# Patient Record
Sex: Female | Born: 1940 | Race: White | Hispanic: No | Marital: Married | Smoking: Former smoker
Health system: Southern US, Community
[De-identification: ages and names within clinical notes are randomized; demographics above are authoritative.]

## PROBLEM LIST (undated history)

## (undated) DIAGNOSIS — J189 Pneumonia, unspecified organism: Secondary | ICD-10-CM

## (undated) DIAGNOSIS — J449 Chronic obstructive pulmonary disease, unspecified: Secondary | ICD-10-CM

## (undated) DIAGNOSIS — K222 Esophageal obstruction: Secondary | ICD-10-CM

## (undated) DIAGNOSIS — K219 Gastro-esophageal reflux disease without esophagitis: Secondary | ICD-10-CM

## (undated) DIAGNOSIS — R0600 Dyspnea, unspecified: Secondary | ICD-10-CM

## (undated) DIAGNOSIS — C50919 Malignant neoplasm of unspecified site of unspecified female breast: Secondary | ICD-10-CM

## (undated) DIAGNOSIS — C159 Malignant neoplasm of esophagus, unspecified: Secondary | ICD-10-CM

## (undated) HISTORY — PX: BREAST LUMPECTOMY: SHX2

## (undated) HISTORY — DX: Malignant neoplasm of unspecified site of unspecified female breast: C50.919

## (undated) HISTORY — DX: Chronic obstructive pulmonary disease, unspecified: J44.9

---

## 1998-04-03 ENCOUNTER — Other Ambulatory Visit: Admission: RE | Admit: 1998-04-03 | Discharge: 1998-04-03 | Payer: Self-pay | Admitting: Obstetrics and Gynecology

## 2000-12-10 ENCOUNTER — Other Ambulatory Visit: Admission: RE | Admit: 2000-12-10 | Discharge: 2000-12-10 | Payer: Self-pay | Admitting: Family Medicine

## 2004-07-12 ENCOUNTER — Other Ambulatory Visit: Admission: RE | Admit: 2004-07-12 | Discharge: 2004-07-12 | Payer: Self-pay | Admitting: Family Medicine

## 2004-12-20 ENCOUNTER — Encounter: Admission: RE | Admit: 2004-12-20 | Discharge: 2004-12-20 | Payer: Self-pay | Admitting: Surgery

## 2005-01-09 ENCOUNTER — Encounter: Admission: RE | Admit: 2005-01-09 | Discharge: 2005-01-09 | Payer: Self-pay | Admitting: Surgery

## 2005-01-10 ENCOUNTER — Ambulatory Visit (HOSPITAL_BASED_OUTPATIENT_CLINIC_OR_DEPARTMENT_OTHER): Admission: RE | Admit: 2005-01-10 | Discharge: 2005-01-10 | Payer: Self-pay | Admitting: Surgery

## 2005-01-10 ENCOUNTER — Ambulatory Visit (HOSPITAL_COMMUNITY): Admission: RE | Admit: 2005-01-10 | Discharge: 2005-01-10 | Payer: Self-pay | Admitting: Surgery

## 2005-01-11 ENCOUNTER — Encounter (INDEPENDENT_AMBULATORY_CARE_PROVIDER_SITE_OTHER): Payer: Self-pay | Admitting: *Deleted

## 2005-01-21 ENCOUNTER — Ambulatory Visit (HOSPITAL_BASED_OUTPATIENT_CLINIC_OR_DEPARTMENT_OTHER): Admission: RE | Admit: 2005-01-21 | Discharge: 2005-01-21 | Payer: Self-pay | Admitting: Surgery

## 2005-01-21 ENCOUNTER — Ambulatory Visit (HOSPITAL_COMMUNITY): Admission: RE | Admit: 2005-01-21 | Discharge: 2005-01-21 | Payer: Self-pay | Admitting: Surgery

## 2005-01-21 ENCOUNTER — Encounter (INDEPENDENT_AMBULATORY_CARE_PROVIDER_SITE_OTHER): Payer: Self-pay | Admitting: *Deleted

## 2005-01-29 ENCOUNTER — Ambulatory Visit: Payer: Self-pay | Admitting: Oncology

## 2005-02-12 ENCOUNTER — Ambulatory Visit: Admission: RE | Admit: 2005-02-12 | Discharge: 2005-04-11 | Payer: Self-pay | Admitting: Radiation Oncology

## 2005-04-04 ENCOUNTER — Ambulatory Visit: Payer: Self-pay | Admitting: Oncology

## 2005-04-29 ENCOUNTER — Ambulatory Visit (HOSPITAL_COMMUNITY): Admission: RE | Admit: 2005-04-29 | Discharge: 2005-04-29 | Payer: Self-pay | Admitting: Oncology

## 2005-05-15 ENCOUNTER — Ambulatory Visit: Admission: RE | Admit: 2005-05-15 | Discharge: 2005-05-15 | Payer: Self-pay | Admitting: Radiation Oncology

## 2005-08-01 ENCOUNTER — Other Ambulatory Visit: Admission: RE | Admit: 2005-08-01 | Discharge: 2005-08-01 | Payer: Self-pay | Admitting: Obstetrics and Gynecology

## 2005-08-23 ENCOUNTER — Ambulatory Visit: Payer: Self-pay | Admitting: Oncology

## 2006-08-04 ENCOUNTER — Other Ambulatory Visit: Admission: RE | Admit: 2006-08-04 | Discharge: 2006-08-04 | Payer: Self-pay | Admitting: Family Medicine

## 2006-08-27 ENCOUNTER — Ambulatory Visit: Payer: Self-pay | Admitting: Oncology

## 2006-08-29 LAB — COMPREHENSIVE METABOLIC PANEL
Alkaline Phosphatase: 42 U/L (ref 39–117)
CO2: 25 mEq/L (ref 19–32)
Calcium: 9.2 mg/dL (ref 8.4–10.5)
Sodium: 140 mEq/L (ref 135–145)
Total Bilirubin: 0.4 mg/dL (ref 0.3–1.2)

## 2006-08-29 LAB — CBC WITH DIFFERENTIAL/PLATELET
Basophils Absolute: 0 10*3/uL (ref 0.0–0.1)
EOS%: 1.8 % (ref 0.0–7.0)
HCT: 40.2 % (ref 34.8–46.6)
LYMPH%: 33.5 % (ref 14.0–48.0)
MCH: 33.2 pg (ref 26.0–34.0)
MCHC: 34.7 g/dL (ref 32.0–36.0)
MONO%: 6.3 % (ref 0.0–13.0)
NEUT#: 3.2 10*3/uL (ref 1.5–6.5)
Platelets: 187 10*3/uL (ref 145–400)
RBC: 4.19 10*6/uL (ref 3.70–5.32)
RDW: 12.5 % (ref 11.3–14.5)

## 2007-02-16 ENCOUNTER — Ambulatory Visit: Payer: Self-pay | Admitting: Oncology

## 2007-02-26 LAB — COMPREHENSIVE METABOLIC PANEL
ALT: 10 U/L (ref 0–35)
AST: 16 U/L (ref 0–37)
CO2: 27 mEq/L (ref 19–32)
Calcium: 9 mg/dL (ref 8.4–10.5)
Chloride: 106 mEq/L (ref 96–112)
Creatinine, Ser: 0.59 mg/dL (ref 0.40–1.20)
Glucose, Bld: 86 mg/dL (ref 70–99)
Potassium: 4.1 mEq/L (ref 3.5–5.3)
Sodium: 140 mEq/L (ref 135–145)

## 2007-02-26 LAB — CBC WITH DIFFERENTIAL/PLATELET
BASO%: 0.5 % (ref 0.0–2.0)
HCT: 39.7 % (ref 34.8–46.6)
LYMPH%: 19.8 % (ref 14.0–48.0)
MCH: 32.8 pg (ref 26.0–34.0)
MCV: 94.1 fL (ref 81.0–101.0)
MONO#: 0.3 10*3/uL (ref 0.1–0.9)
NEUT#: 4.3 10*3/uL (ref 1.5–6.5)
Platelets: 181 10*3/uL (ref 145–400)
RBC: 4.23 10*6/uL (ref 3.70–5.32)
RDW: 12.9 % (ref 11.3–14.5)
lymph#: 1.2 10*3/uL (ref 0.9–3.3)

## 2007-03-05 ENCOUNTER — Encounter: Admission: RE | Admit: 2007-03-05 | Discharge: 2007-03-05 | Payer: Self-pay | Admitting: Family Medicine

## 2007-08-14 ENCOUNTER — Ambulatory Visit: Payer: Self-pay | Admitting: Oncology

## 2007-08-18 LAB — COMPREHENSIVE METABOLIC PANEL
Albumin: 4.4 g/dL (ref 3.5–5.2)
Alkaline Phosphatase: 41 U/L (ref 39–117)
BUN: 6 mg/dL (ref 6–23)
Calcium: 9.4 mg/dL (ref 8.4–10.5)
Chloride: 107 mEq/L (ref 96–112)
Glucose, Bld: 87 mg/dL (ref 70–99)
Potassium: 4.4 mEq/L (ref 3.5–5.3)
Sodium: 139 mEq/L (ref 135–145)

## 2007-08-18 LAB — CBC WITH DIFFERENTIAL/PLATELET
BASO%: 0.9 % (ref 0.0–2.0)
EOS%: 1.4 % (ref 0.0–7.0)
Eosinophils Absolute: 0.1 10*3/uL (ref 0.0–0.5)
LYMPH%: 29.9 % (ref 14.0–48.0)
MONO#: 0.3 10*3/uL (ref 0.1–0.9)
NEUT#: 3.4 10*3/uL (ref 1.5–6.5)
NEUT%: 63.1 % (ref 39.6–76.8)
RBC: 4.32 10*6/uL (ref 3.70–5.32)
RDW: 12.9 % (ref 11.3–14.5)

## 2007-08-18 LAB — LACTATE DEHYDROGENASE: LDH: 140 U/L (ref 94–250)

## 2008-02-16 ENCOUNTER — Ambulatory Visit: Payer: Self-pay | Admitting: Oncology

## 2008-02-18 LAB — CBC WITH DIFFERENTIAL/PLATELET
BASO%: 1.2 % (ref 0.0–2.0)
Basophils Absolute: 0.1 10*3/uL (ref 0.0–0.1)
Eosinophils Absolute: 0.1 10*3/uL (ref 0.0–0.5)
LYMPH%: 32.7 % (ref 14.0–48.0)
MCH: 33.4 pg (ref 26.0–34.0)
MCV: 93.2 fL (ref 81.0–101.0)
MONO#: 0.2 10*3/uL (ref 0.1–0.9)
MONO%: 4.1 % (ref 0.0–13.0)
NEUT#: 3.4 10*3/uL (ref 1.5–6.5)
NEUT%: 60.5 % (ref 39.6–76.8)
Platelets: 169 10*3/uL (ref 145–400)
WBC: 5.6 10*3/uL (ref 3.9–10.0)

## 2008-02-18 LAB — COMPREHENSIVE METABOLIC PANEL
AST: 14 U/L (ref 0–37)
Albumin: 4.2 g/dL (ref 3.5–5.2)
Alkaline Phosphatase: 48 U/L (ref 39–117)
CO2: 24 mEq/L (ref 19–32)
Chloride: 107 mEq/L (ref 96–112)
Potassium: 3.8 mEq/L (ref 3.5–5.3)
Total Bilirubin: 0.4 mg/dL (ref 0.3–1.2)

## 2008-10-13 ENCOUNTER — Ambulatory Visit: Payer: Self-pay | Admitting: Oncology

## 2008-10-17 LAB — CBC WITH DIFFERENTIAL/PLATELET
BASO%: 0.5 % (ref 0.0–2.0)
Basophils Absolute: 0 10*3/uL (ref 0.0–0.1)
HCT: 40.6 % (ref 34.8–46.6)
LYMPH%: 11.1 % — ABNORMAL LOW (ref 14.0–48.0)
MCH: 33.1 pg (ref 26.0–34.0)
MCHC: 34.3 g/dL (ref 32.0–36.0)
NEUT#: 8.5 10*3/uL — ABNORMAL HIGH (ref 1.5–6.5)
NEUT%: 84.4 % — ABNORMAL HIGH (ref 39.6–76.8)
RBC: 4.22 10*6/uL (ref 3.70–5.32)
WBC: 10.1 10*3/uL — ABNORMAL HIGH (ref 3.9–10.0)
lymph#: 1.1 10*3/uL (ref 0.9–3.3)

## 2008-10-18 LAB — LACTATE DEHYDROGENASE: LDH: 156 U/L (ref 94–250)

## 2008-10-18 LAB — COMPREHENSIVE METABOLIC PANEL
ALT: 11 U/L (ref 0–35)
AST: 13 U/L (ref 0–37)
BUN: 9 mg/dL (ref 6–23)
CO2: 25 mEq/L (ref 19–32)
Creatinine, Ser: 0.66 mg/dL (ref 0.40–1.20)

## 2008-10-18 LAB — CANCER ANTIGEN 27.29: CA 27.29: 15 U/mL (ref 0–39)

## 2009-04-28 ENCOUNTER — Ambulatory Visit: Payer: Self-pay | Admitting: Oncology

## 2009-10-31 ENCOUNTER — Ambulatory Visit: Payer: Self-pay | Admitting: Oncology

## 2009-11-02 LAB — CBC WITH DIFFERENTIAL/PLATELET
Basophils Absolute: 0 10*3/uL (ref 0.0–0.1)
HCT: 38.2 % (ref 34.8–46.6)
HGB: 13.1 g/dL (ref 11.6–15.9)
MCH: 33.4 pg (ref 25.1–34.0)
MONO#: 0.3 10*3/uL (ref 0.1–0.9)
MONO%: 5.4 % (ref 0.0–14.0)
RBC: 3.94 10*6/uL (ref 3.70–5.45)
RDW: 12.7 % (ref 11.2–14.5)
lymph#: 1.4 10*3/uL (ref 0.9–3.3)

## 2009-11-03 LAB — COMPREHENSIVE METABOLIC PANEL
AST: 15 U/L (ref 0–37)
BUN: 12 mg/dL (ref 6–23)
CO2: 21 mEq/L (ref 19–32)
Creatinine, Ser: 0.73 mg/dL (ref 0.40–1.20)
Total Bilirubin: 0.3 mg/dL (ref 0.3–1.2)
Total Protein: 5.9 g/dL — ABNORMAL LOW (ref 6.0–8.3)

## 2009-11-03 LAB — LACTATE DEHYDROGENASE: LDH: 128 U/L (ref 94–250)

## 2009-11-03 LAB — VITAMIN D 25 HYDROXY (VIT D DEFICIENCY, FRACTURES): Vit D, 25-Hydroxy: 48 ng/mL (ref 30–89)

## 2009-11-11 ENCOUNTER — Ambulatory Visit: Payer: Self-pay | Admitting: Family Medicine

## 2009-11-11 DIAGNOSIS — L509 Urticaria, unspecified: Secondary | ICD-10-CM | POA: Insufficient documentation

## 2009-11-11 LAB — CONVERTED CEMR LAB
Basophils Relative: 1 % (ref 0–1)
Eosinophils Absolute: 0.2 10*3/uL (ref 0.0–0.7)
Eosinophils Relative: 4 % (ref 0–5)
HCT: 44.7 % (ref 36.0–46.0)
Hemoglobin: 15.1 g/dL — ABNORMAL HIGH (ref 12.0–15.0)
Monocytes Absolute: 0.3 10*3/uL (ref 0.1–1.0)
Monocytes Relative: 6 % (ref 3–12)
RBC: 4.73 M/uL (ref 3.87–5.11)

## 2009-11-18 ENCOUNTER — Telehealth: Payer: Self-pay | Admitting: Family Medicine

## 2010-02-20 ENCOUNTER — Encounter: Admission: RE | Admit: 2010-02-20 | Discharge: 2010-02-20 | Payer: Self-pay | Admitting: Oncology

## 2010-11-06 ENCOUNTER — Ambulatory Visit: Payer: Self-pay | Admitting: Oncology

## 2011-01-02 ENCOUNTER — Ambulatory Visit: Payer: Self-pay | Admitting: Oncology

## 2011-01-03 LAB — CBC WITH DIFFERENTIAL/PLATELET
BASO%: 0.8 % (ref 0.0–2.0)
Basophils Absolute: 0.1 10*3/uL (ref 0.0–0.1)
EOS%: 0.8 % (ref 0.0–7.0)
Eosinophils Absolute: 0.1 10*3/uL (ref 0.0–0.5)
HCT: 38.4 % (ref 34.8–46.6)
HGB: 13.2 g/dL (ref 11.6–15.9)
LYMPH%: 19.2 % (ref 14.0–49.7)
MCH: 31.4 pg (ref 25.1–34.0)
MCHC: 34.3 g/dL (ref 31.5–36.0)
MCV: 91.4 fL (ref 79.5–101.0)
MONO#: 0.3 10*3/uL (ref 0.1–0.9)
MONO%: 4.4 % (ref 0.0–14.0)
NEUT#: 4.8 10*3/uL (ref 1.5–6.5)
NEUT%: 74.8 % (ref 38.4–76.8)
Platelets: 249 10*3/uL (ref 145–400)
RBC: 4.2 10*6/uL (ref 3.70–5.45)
RDW: 13.1 % (ref 11.2–14.5)
WBC: 6.4 10*3/uL (ref 3.9–10.3)
lymph#: 1.2 10*3/uL (ref 0.9–3.3)

## 2011-01-04 LAB — COMPREHENSIVE METABOLIC PANEL
ALT: 12 U/L (ref 0–35)
AST: 14 U/L (ref 0–37)
Albumin: 4.2 g/dL (ref 3.5–5.2)
Alkaline Phosphatase: 76 U/L (ref 39–117)
BUN: 7 mg/dL (ref 6–23)
CO2: 28 mEq/L (ref 19–32)
Calcium: 9.1 mg/dL (ref 8.4–10.5)
Chloride: 103 mEq/L (ref 96–112)
Creatinine, Ser: 0.69 mg/dL (ref 0.40–1.20)
Glucose, Bld: 80 mg/dL (ref 70–99)
Potassium: 4.1 mEq/L (ref 3.5–5.3)
Sodium: 141 mEq/L (ref 135–145)
Total Bilirubin: 0.4 mg/dL (ref 0.3–1.2)
Total Protein: 6.4 g/dL (ref 6.0–8.3)

## 2011-01-04 LAB — VITAMIN D 25 HYDROXY (VIT D DEFICIENCY, FRACTURES): Vit D, 25-Hydroxy: 18 ng/mL — ABNORMAL LOW (ref 30–89)

## 2011-01-04 LAB — LACTATE DEHYDROGENASE: LDH: 141 U/L (ref 94–250)

## 2011-05-17 NOTE — Op Note (Signed)
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   OPERATION PERFORMED:  Re-excision of left breast cancer site.   SURGEON:  Currie Paris, M.D.   ANESTHESIA:  MAC.   INDICATIONS FOR PROCEDURE:  The patient is a 70 year old who was recently  found to have a small breast cancer at the 12 o'clock position.  Her initial  excision had a microscopically focally positive cranial margin.  After  discussion with the patient, we decided to try to re-excise this single  margin.   DESCRIPTION OF PROCEDURE:  The patient was seen in the holding areas and had  no further questions.  She had already marked the left breast as the  operative side and I confirmed and marked it as well.  The patient was taken  to the operating room and given intravenous sedation.  The left breast was  prepped and draped as a sterile field.  The old scar was reopened and the  seroma cavity entered.  Superiorly, I had a fairly long flap as I had  already gone fairly high in the breast tissue getting out the original  cancer but using some skin hooks to elevate the skin, I took what little  breast tissue remained superiorly so that I was several centimeters above  the incision and took bot the subcutaneous skin and what thin amount of  breast tissue remained at the superior margin.  The deep margin had already  been the muscle and this was left alone.  There was nothing to suggest any  other tumor around.   I made sure everything was dry and then closed the  skin with some 4-0  Monocryl subcuticular and Dermabond.  We held some pressure to try to be  sure there was no oozing.  Although everything appeared to be dry, we were  going to have a fairly large seroma cavity following this re-excision.   The patient tolerated the procedure well.  There were no operative  complications.  All counts were correct.      CJS/MEDQ  D:  01/21/2005  T:  01/21/2005  Job:  [id number]

## 2011-11-25 ENCOUNTER — Telehealth: Payer: Self-pay | Admitting: *Deleted

## 2011-11-25 NOTE — Telephone Encounter (Signed)
left voice message to inform the patient of the new date and time asked patient to please call me back and let me know she did get the message

## 2011-12-20 ENCOUNTER — Other Ambulatory Visit: Payer: Self-pay | Admitting: Lab

## 2011-12-23 ENCOUNTER — Ambulatory Visit: Payer: Self-pay | Admitting: Oncology

## 2011-12-27 ENCOUNTER — Ambulatory Visit: Payer: Self-pay | Admitting: Oncology

## 2011-12-30 ENCOUNTER — Other Ambulatory Visit: Payer: Self-pay | Admitting: Oncology

## 2011-12-30 ENCOUNTER — Other Ambulatory Visit (HOSPITAL_BASED_OUTPATIENT_CLINIC_OR_DEPARTMENT_OTHER): Payer: BC Managed Care – PPO | Admitting: Lab

## 2011-12-30 DIAGNOSIS — D0590 Unspecified type of carcinoma in situ of unspecified breast: Secondary | ICD-10-CM

## 2011-12-30 DIAGNOSIS — Z803 Family history of malignant neoplasm of breast: Secondary | ICD-10-CM

## 2011-12-30 LAB — CBC WITH DIFFERENTIAL/PLATELET
EOS%: 2.3 % (ref 0.0–7.0)
HGB: 14.6 g/dL (ref 11.6–15.9)
LYMPH%: 25.9 % (ref 14.0–49.7)
MCV: 92.4 fL (ref 79.5–101.0)
NEUT#: 3.3 10*3/uL (ref 1.5–6.5)
NEUT%: 66.1 % (ref 38.4–76.8)

## 2011-12-31 ENCOUNTER — Other Ambulatory Visit: Payer: Self-pay | Admitting: Oncology

## 2011-12-31 LAB — COMPREHENSIVE METABOLIC PANEL
Albumin: 4.2 g/dL (ref 3.5–5.2)
Calcium: 9.2 mg/dL (ref 8.4–10.5)
Chloride: 104 mEq/L (ref 96–112)
Creatinine, Ser: 0.64 mg/dL (ref 0.50–1.10)
Glucose, Bld: 79 mg/dL (ref 70–99)
Potassium: 4.2 mEq/L (ref 3.5–5.3)
Sodium: 139 mEq/L (ref 135–145)

## 2011-12-31 LAB — VITAMIN D 25 HYDROXY (VIT D DEFICIENCY, FRACTURES): Vit D, 25-Hydroxy: 43 ng/mL (ref 30–89)

## 2012-01-09 ENCOUNTER — Ambulatory Visit (HOSPITAL_BASED_OUTPATIENT_CLINIC_OR_DEPARTMENT_OTHER): Payer: BC Managed Care – PPO | Admitting: Oncology

## 2012-01-09 VITALS — BP 147/79 | HR 67 | Temp 98.0°F | Ht 61.0 in | Wt 84.3 lb

## 2012-01-09 DIAGNOSIS — D0510 Intraductal carcinoma in situ of unspecified breast: Secondary | ICD-10-CM

## 2012-01-09 DIAGNOSIS — D0590 Unspecified type of carcinoma in situ of unspecified breast: Secondary | ICD-10-CM

## 2012-01-09 NOTE — Progress Notes (Signed)
Hematology and Oncology Follow Up Visit  Karen Osborn [id number] 09-26-1941 71 y.o. 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 Tamoxifen x  85yrs. On annual f/u.  Interim History:  There have been no intercurrent illness, hospitalizations or medication changes. Due for mamm 2/13.  Medications: I have reviewed the patient's current medications.  Allergies: No Known Allergies  Past Medical History, Surgical history, Social history, and Family History were reviewed and updated.  Review of Systems: Constitutional:  Negative for fever, chills, night sweats, anorexia, weight loss, pain. Cardiovascular: no chest pain or dyspnea on exertion Respiratory: no cough, shortness of breath, or wheezing Neurological: negative Dermatological: negative ENT: negative Skin Gastrointestinal: no abdominal pain, change in bowel habits, or black or bloody stools Genito-Urinary: no dysuria, trouble voiding, or hematuria Hematological and Lymphatic: negative Breast: negative Musculoskeletal: negative Remaining ROS negative.  Physical Exam: Blood pressure 147/79, pulse 67, temperature 98 F (36.7 C), temperature source Oral, height 5\' 1"  (1.549 m), weight 84 lb 4.8 oz (38.238 kg). ECOG: 0, thin woman General appearance: alert, cooperative and appears stated age Head: Normocephalic, without obvious abnormality, atraumatic Neck: no adenopathy, no carotid bruit, no JVD, supple, symmetrical, trachea midline and thyroid not enlarged, symmetric, no tenderness/mass/nodules Lymph nodes: Cervical, supraclavicular, and axillary nodes normal., bilateral cervical ribs Cardiac : regular rate and rhythm, no murmurs or gallops Pulmonary:clear to auscultation bilaterally and normal percussion bilaterally Breasts: inspection negative, no nipple discharge or bleeding, no masses or nodularity palpable,  Abdomen:soft, non-tender; bowel sounds normal; no  masses,  no organomegaly Extremities negative Neuro: alert, oriented, normal speech, no focal findings or movement disorder noted  Lab Results: Lab Results  Component Value Date   WBC 5.0 12/30/2011   HGB 14.6 12/30/2011   HCT 42.4 12/30/2011   MCV 92.4 12/30/2011   PLT 166 12/30/2011     Chemistry      Component Value Date/Time   NA 139 12/30/2011 0941   NA 139 12/30/2011 0941   K 4.2 12/30/2011 0941   K 4.2 12/30/2011 0941   CL 104 12/30/2011 0941   CL 104 12/30/2011 0941   CO2 28 12/30/2011 0941   CO2 28 12/30/2011 0941   BUN 7 12/30/2011 0941   BUN 7 12/30/2011 0941   CREATININE 0.64 12/30/2011 0941   CREATININE 0.64 12/30/2011 0941      Component Value Date/Time   CALCIUM 9.2 12/30/2011 0941   CALCIUM 9.2 12/30/2011 0941   ALKPHOS 68 12/30/2011 0941   ALKPHOS 68 12/30/2011 0941   AST 16 12/30/2011 0941   AST 16 12/30/2011 0941   ALT 9 12/30/2011 0941   ALT 9 12/30/2011 0941   BILITOT 0.5 12/30/2011 0941   BILITOT 0.5 12/30/2011 0941      .pathology. Radiological Studies: chest X-ray n/a Mammogram 2/13 Bone density n/a  Impression and Plan: Karen Osborn is doing well, she is chronically thin. I will see her in 1 yr .  More than 50% of the visit was spent in patient-related counselling   Pierce Crane, MD 1/10/20132:20 PM

## 2012-12-24 ENCOUNTER — Telehealth: Payer: Self-pay | Admitting: *Deleted

## 2012-12-24 NOTE — Telephone Encounter (Signed)
left voice message to inform the patient that the appointment has been cancelled please call back to reschedule

## 2013-01-08 ENCOUNTER — Ambulatory Visit: Payer: BC Managed Care – PPO | Admitting: Oncology

## 2013-01-08 ENCOUNTER — Other Ambulatory Visit: Payer: BC Managed Care – PPO | Admitting: Lab

## 2013-02-06 ENCOUNTER — Telehealth: Payer: Self-pay | Admitting: *Deleted

## 2013-02-06 NOTE — Telephone Encounter (Signed)
Per patient reassignment I have contact the patient. I have explained that Dr. Donnie Coffin is no longer with the practice, but her has reviewed her chart and wants [facility] to follow her care. Patient agrees, and appt made. Letter mailed. JMW

## 2013-02-12 ENCOUNTER — Ambulatory Visit: Payer: BC Managed Care – PPO | Admitting: Family

## 2013-02-18 ENCOUNTER — Encounter: Payer: Self-pay | Admitting: Family

## 2013-02-18 ENCOUNTER — Ambulatory Visit (HOSPITAL_BASED_OUTPATIENT_CLINIC_OR_DEPARTMENT_OTHER): Payer: BC Managed Care – PPO | Admitting: Family

## 2013-02-18 VITALS — BP 118/71 | HR 76 | Temp 98.4°F | Resp 18 | Ht 61.0 in | Wt 76.5 lb

## 2013-02-18 DIAGNOSIS — C50919 Malignant neoplasm of unspecified site of unspecified female breast: Secondary | ICD-10-CM | POA: Insufficient documentation

## 2013-02-18 NOTE — Progress Notes (Signed)
ID: Karen Osborn   DOB: 07-03-1941  MR#: [id number]  CSN#:[id number]  PCP: Gillermina Hu, NP-C SURGEON:  Cyndia Bent, MD RAD ONC:  Margaretmary Dys, MD    HISTORY OF PRESENT ILLNESS: Taken from [name]'s new patient evaluation dated 02/08/2005: "Karen Osborn is a pleasant 72 year old woman referred by [name] for evaluation and treatment of DCIS. This woman has been in good health all of her life.  She has no chronic medical problems and is not taking any medications on a daily basis.  She undergoes routine scanning mammography on a yearly basis.  She had a mammogram on 12/10/04.  On the left at 12 o'clock, there was a 7 mm cluster of punctate calcifications.  Ultrasound showed at the time a normal echo pattern.  There was an irregular hypoechoic mass measuring 4 mm with calcifications at the 12 o'clock position on the left breast.  A biopsy was performed the same day.  This showed DCIS.  She was referred to [name]. He took her to the operating room on 01/11/05.  A lumpectomy was performed.  A complex high-grade DCIS with necrosis measuring 1.1 cm was removed.  DCIS was present at the cauterized cranial margin. This was positive for both ER and PR.  There was DCIS within 1 mm of the deep margin as well as 3 mm of the skin margin. Karen Osborn underwent a reexcision on 01/21/05.  The closest approach of DCIS to the new margin was 1 mm.   Extensive biopsy changes were seen. An additional focus of DCIS measuring 3 mm was removed.  Karen Osborn had an unremarkable postoperative course."  [name] is subsequent history is as detailed below.     INTERVAL HISTORY: Dr. Darnelle Catalan and I saw Karen Osborn today for follow up of ductal carcinoma in situ high-grade. Since her last office visit with Dr. Donnie Coffin on 01/09/2012,  she states that she has been doing well. She is establishing herself with Dr. Darrall Dears service today.    REVIEW OF SYSTEMS: A 10 point review of systems was  completed and is negative, except for hot flashes night sweats and vaginal dryness. The patient denies any other symptomatology.  PAST MEDICAL HISTORY: Past Medical History  Diagnosis Date  . Breast cancer   . COPD (chronic obstructive pulmonary disease)     PAST SURGICAL HISTORY: Past Surgical History  Procedure Laterality Date  . Breast lumpectomy Left 1/132006    FAMILY HISTORY Family History  Problem Relation Age of Onset  . Heart attack Mother   . Heart attack Father   . Arrhythmia Sister   . Cancer Sister     Breast Cancer  . COPD Brother   . Cancer Sister     Skin Cancer, Breast Cancer    GYNECOLOGIC HISTORY:  Gravida 1, para 1, menarche age 51,. Age 43. Postmenopausal at age 51, she had used estrogen replacement and medroxyprogesterone.  SOCIAL HISTORY:  The patient is a current everyday smoker and does not want smoking cessation material at this time. She has been married for over 50 years and has one adult child. Both her and her husband are retired.   ADVANCED DIRECTIVES:  Not on file  HEALTH MAINTENANCE: History  Substance Use Topics  . Smoking status: Current Every Day Smoker  . Smokeless tobacco: Never Used  . Alcohol Use: No     Colonoscopy: Past due by 2 years  PAP: Not on file  Bone density: The patient states her last  bone density scan was 2 years ago.  Lipid panel: Not on file  Mammogram: Last mammogram was on 03/31/2012.  No Known Allergies  Current Outpatient Prescriptions  Medication Sig Dispense Refill  . Multiple Vitamins-Minerals (MULTIVITAMIN WITH MINERALS) tablet Take 1 tablet by mouth daily.       No current facility-administered medications for this visit.    OBJECTIVE: Filed Vitals:   02/18/13 1406  BP: 118/71  Pulse: 76  Temp: 98.4 F (36.9 C)  Resp: 18     Body mass index is 14.46 kg/(m^2).      ECOG FS:  Grade 0 - Fully active  General appearance: Alert, cooperative, thin frame, no apparent distress Head:  Normocephalic, without obvious abnormality, atraumatic Eyes: Arcus senilis, PERRLA, EOMI Nose: Nares, septum and mucosa are normal, no drainage or sinus tenderness Neck: No adenopathy, supple, symmetrical, trachea midline, thyroid not enlarged, no tenderness Resp: Clear to auscultation bilaterally, diminished throughout Cardio: Regular rate and rhythm, S1, S2 normal, no murmur, click, rub or gallop Breasts: Soft bilaterally, well-healed left breast surgical scar, firm left inframammary area, no lymphadenopathy, no nipple inversion, no axilla fullness, benign breast exam GI: Soft, not distended, non-tender, hypoactive bowel sounds, no organomegaly Extremities: Extremities normal, atraumatic, no cyanosis or edema Lymph nodes: Cervical, supraclavicular, and axillary nodes normal Neurologic: Grossly normal   LAB RESULTS: Lab Results  Component Value Date   WBC 5.0 12/30/2011   NEUTROABS 3.3 12/30/2011   HGB 14.6 12/30/2011   HCT 42.4 12/30/2011   MCV 92.4 12/30/2011   PLT 166 12/30/2011      Chemistry      Component Value Date/Time   NA 139 12/30/2011 0941   K 4.2 12/30/2011 0941   CL 104 12/30/2011 0941   CO2 28 12/30/2011 0941   BUN 7 12/30/2011 0941   CREATININE 0.64 12/30/2011 0941      Component Value Date/Time   CALCIUM 9.2 12/30/2011 0941   ALKPHOS 68 12/30/2011 0941   AST 16 12/30/2011 0941   ALT 9 12/30/2011 0941   BILITOT 0.5 12/30/2011 0941      Lab Results  Component Value Date   LABCA2 15 10/17/2008    Urinalysis No results found for this basename: colorurine,  appearanceur,  labspec,  phurine,  glucoseu,  hgbur,  bilirubinur,  ketonesur,  proteinur,  urobilinogen,  nitrite,  leukocytesur    STUDIES: No results found.  ASSESSMENT: 72 y.o. [place], [place] woman: 1. Status post left breast lumpectomy on 01/11/2005, 1.1 cm DCIS, ER/PR positive.  2. Reexcised on 01/21/2005 for a 3 mm skin margin removal.  3. Status post radiation therapy  completed in 2006.  4. Status post 5 years of antiestrogen therapy with tamoxifen completed in 2011.  PLAN: Karen Osborn is doing well from a breast cancer point of view. She is experiencing usual menopausal problems. Dr. Darnelle Catalan explained to her in detail that her chance of recurrence is extraordinarily low. Mrs. Colvard was officially graduated from [facility] breast cancer center program today with the understanding that she may contact us at any time with any questions or concerns.  It was explained that the patient should continue annual mammograms and an annual clinical breast exam. The patient agreed to do so. We will see the patient from here going forward on an as-needed basis.  The patient will be due for her annual mammogram in 03/2013.  All questions were answered.  The patient was encouraged to contact us with any problems, questions or concerns.  Larina Bras, NP-C 02/18/2013, 10:08 PM

## 2013-02-18 NOTE — Patient Instructions (Signed)
Please contact us at [phone] if you have any questions or concerns. 

## 2015-11-21 ENCOUNTER — Ambulatory Visit (INDEPENDENT_AMBULATORY_CARE_PROVIDER_SITE_OTHER): Payer: Medicare Other | Admitting: Internal Medicine

## 2015-11-21 ENCOUNTER — Other Ambulatory Visit: Payer: Self-pay | Admitting: Family Medicine

## 2015-11-21 DIAGNOSIS — R0600 Dyspnea, unspecified: Secondary | ICD-10-CM | POA: Diagnosis not present

## 2015-11-21 LAB — PULMONARY FUNCTION TEST
DL/VA % pred: 51 %
DL/VA: 2.27 ml/min/mmHg/L
DLCO unc % pred: 40 %
DLCO unc: 8.1 ml/min/mmHg
FEF 25-75 PRE: 0.27 L/s
FEF 25-75 Post: 0.43 L/sec
FEF2575-%CHANGE-POST: 60 %
FEF2575-%PRED-POST: 26 %
FEF2575-%PRED-PRE: 16 %
FEV1-%Change-Post: 18 %
FEV1-%PRED-POST: 42 %
FEV1-%PRED-PRE: 36 %
FEV1-POST: 0.83 L
FEV1-PRE: 0.7 L
FEV1FVC-%CHANGE-POST: 2 %
FEV1FVC-%Pred-Pre: 60 %
FEV6-%CHANGE-POST: 16 %
FEV6-%Pred-Post: 69 %
FEV6-%Pred-Pre: 59 %
FEV6-PRE: 1.45 L
FEV6-Post: 1.69 L
FEV6FVC-%Change-Post: 2 %
FEV6FVC-%Pred-Post: 101 %
FEV6FVC-%Pred-Pre: 99 %
FVC-%CHANGE-POST: 15 %
FVC-%PRED-PRE: 59 %
FVC-%Pred-Post: 69 %
FVC-PRE: 1.53 L
FVC-Post: 1.77 L
POST FEV1/FVC RATIO: 47 %
PRE FEV1/FVC RATIO: 46 %
Post FEV6/FVC ratio: 97 %
Pre FEV6/FVC Ratio: 95 %

## 2015-11-21 NOTE — Progress Notes (Signed)
PFT done today. 

## 2016-04-24 ENCOUNTER — Ambulatory Visit (INDEPENDENT_AMBULATORY_CARE_PROVIDER_SITE_OTHER): Payer: Medicare Other | Admitting: Pulmonary Disease

## 2016-04-24 ENCOUNTER — Ambulatory Visit (INDEPENDENT_AMBULATORY_CARE_PROVIDER_SITE_OTHER)
Admission: RE | Admit: 2016-04-24 | Discharge: 2016-04-24 | Disposition: A | Discharge status: Home / Self Care | Payer: Medicare Other | Source: Ambulatory Visit | Attending: Pulmonary Disease | Admitting: Pulmonary Disease

## 2016-04-24 ENCOUNTER — Encounter: Payer: Self-pay | Admitting: Pulmonary Disease

## 2016-04-24 VITALS — BP 100/80 | HR 89 | Ht 61.0 in | Wt 73.0 lb

## 2016-04-24 DIAGNOSIS — J449 Chronic obstructive pulmonary disease, unspecified: Secondary | ICD-10-CM | POA: Diagnosis not present

## 2016-04-24 DIAGNOSIS — R0602 Shortness of breath: Secondary | ICD-10-CM

## 2016-04-24 DIAGNOSIS — Z720 Tobacco use: Secondary | ICD-10-CM | POA: Diagnosis not present

## 2016-04-24 DIAGNOSIS — F17210 Nicotine dependence, cigarettes, uncomplicated: Secondary | ICD-10-CM | POA: Insufficient documentation

## 2016-04-24 NOTE — Assessment & Plan Note (Signed)
Have to quit smoking! We discussed nicotine patch 14 mg /d She would like to quit cold [place] I emphasized to set a quit date  Lung cancer screening counseling on future visit

## 2016-04-24 NOTE — Assessment & Plan Note (Signed)
severe COPD-lung function is at 30%  Trial of Spiriva Respimat 2 puffs daily morning Use pro-air  2 puffs every 6 hours as needed for shortness of breath  Chest x-ray today Referral to pulmonary rehabilitation  Have to quit smoking! We discussed nicotine patch 14 mg /d

## 2016-04-24 NOTE — Patient Instructions (Addendum)
You have severe COPD-lung function is at 30%  Trial of Spiriva Respimat 2 puffs daily morning Use pro-air  2 puffs every 6 hours as needed for shortness of breath  Chest x-ray today Referral to pulmonary rehabilitation  Have to quit smoking! We discussed nicotine patch 14 mg /d

## 2016-04-24 NOTE — Progress Notes (Signed)
Subjective:    Patient ID: Karen Osborn, female    DOB: 1941-04-16, 75 y.o.   MRN: [id number]  HPI   Chief Complaint  Patient presents with  . Pulmonary Consult    Referred by Dr. Alferd Apa; patient has COPD, was given inhalers that keep making her sick.  lot of SOB. CAT SCORE: 46      75 year old smoker presents for evaluation of COPD. She reports dyspnea on exertion especially on climbing stairs, she is able to walk on level ground and carry groceries from the store. 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 She reports increased dyspnea during spring and fall. She reports an occasional cough productive of minimal milky sputum, denies frequent chest colds or wheezing. There is no history of chest pain, orthopnea or paroxysmal nocturnal dyspnea  She was trialed on multiple medications- Breo caused dryness of mouth, Symbicort and stiolto tore up her stomach.  She smoked about a pack per day starting around age 75, more than 50 pack years, now a pack lasts her a day and a half. She is a breast cancer survivor from 2006.   Spirometry was a good effort, showed severe airway obstruction with FEV1 32% with ratio 43 and FVC of 57%  Past Medical History  Diagnosis Date  . Breast cancer ([place])   . COPD (chronic obstructive pulmonary disease) [facility])      Past Surgical History  Procedure Laterality Date  . Breast lumpectomy Left 1/132006     Allergies  Allergen Reactions  . Codeine Nausea And Vomiting    Social History   Social History  . Marital Status: Married    Spouse Name: N/A  . Number of Children: N/A  . Years of Education: N/A   Occupational History  . Not on file.   Social History Main Topics  . Smoking status: Current Every Day Smoker -- 1.00 packs/day for 54 years    Types: Cigarettes  . Smokeless tobacco: Never Used  . Alcohol Use: No  . Drug Use: No  . Sexual Activity:  Not on file   Other Topics Concern  . Not on file   Social History Narrative     Family History  Problem Relation Age of Onset  . Heart attack Mother   . Heart attack Father   . Arrhythmia Sister   . Cancer Sister     Breast Cancer  . COPD Brother   . Cancer Sister     Skin Cancer, Breast Cancer      Review of Systems Constitutional: negative for anorexia, fevers and sweats  Eyes: negative for irritation, redness and visual disturbance  Ears, nose, mouth, throat, and face: negative for earaches, epistaxis, nasal congestion and sore throat  Respiratory: negative for cough, dyspnea on exertion, sputum and wheezing  Cardiovascular: negative for chest pain, dyspnea, lower extremity edema, orthopnea, palpitations and syncope  Gastrointestinal: negative for abdominal pain, constipation, diarrhea, melena, nausea and vomiting  Genitourinary:negative for dysuria, frequency and hematuria  Hematologic/lymphatic: negative for bleeding, easy bruising and lymphadenopathy  Musculoskeletal:negative for arthralgias, muscle weakness and stiff joints  Neurological: negative for coordination problems, gait problems, headaches and weakness  Endocrine: negative for diabetic symptoms including polydipsia, polyuria and weight loss     Objective:   Physical Exam  Gen. Pleasant, thin, frail, in no distress ENT - no lesions, no post nasal drip Neck: No JVD, no  thyromegaly, no carotid bruits Lungs: no use of accessory muscles, no dullness to percussion, decreased without rales or rhonchi  Cardiovascular: Rhythm regular, heart sounds  normal, no murmurs or gallops, no peripheral edema Musculoskeletal: No deformities, no cyanosis or clubbing        Assessment & Plan:

## 2016-04-26 ENCOUNTER — Telehealth: Payer: Self-pay | Admitting: Pulmonary Disease

## 2016-04-26 NOTE — Progress Notes (Signed)
Quick Note:  Called spoke with pt. Reviewed results and recs. Pt voiced understanding and had no further questions. ______ 

## 2016-04-26 NOTE — Telephone Encounter (Signed)
Notes Recorded by Rigoberto Noel, MD on 04/24/2016 at 5:03 PM Changes of emphysema  Called spoke with pt. Reviewed results and recs. Pt voiced understanding and had no further questions.

## 2016-06-03 ENCOUNTER — Telehealth: Payer: Self-pay | Admitting: Pulmonary Disease

## 2016-06-03 NOTE — Telephone Encounter (Signed)
lmtcb X1 for pt. spiriva is not on pt's med list, but was given as sample at last OV.

## 2016-06-04 MED ORDER — TIOTROPIUM BROMIDE MONOHYDRATE 2.5 MCG/ACT IN AERS: 2.0000 | INHALATION_SPRAY | Freq: Every day | RESPIRATORY_TRACT | Status: DC

## 2016-06-04 NOTE — Telephone Encounter (Signed)
Patient cb and states yes she was given a sample of this at last ov, this med is currently working for her and she is now requesting a rx, please send to [facility], [phone]

## 2016-06-04 NOTE — Telephone Encounter (Signed)
Spoke with pt. States that the sample she was given at her last OV needs to be sent to her pharmacy. Rx for Spiriva Respimat has been sent in. Nothing further was needed.

## 2016-08-08 ENCOUNTER — Encounter: Payer: Self-pay | Admitting: Family Medicine

## 2016-08-26 ENCOUNTER — Other Ambulatory Visit: Payer: Self-pay | Admitting: *Deleted

## 2016-09-26 ENCOUNTER — Ambulatory Visit (INDEPENDENT_AMBULATORY_CARE_PROVIDER_SITE_OTHER): Payer: BC Managed Care – PPO | Admitting: Internal Medicine

## 2016-09-26 ENCOUNTER — Encounter: Payer: Self-pay | Admitting: Internal Medicine

## 2016-09-26 VITALS — BP 112/80 | HR 73 | Ht 61.0 in | Wt 72.0 lb

## 2016-09-26 DIAGNOSIS — F17210 Nicotine dependence, cigarettes, uncomplicated: Secondary | ICD-10-CM | POA: Diagnosis not present

## 2016-09-26 DIAGNOSIS — Z720 Tobacco use: Secondary | ICD-10-CM | POA: Diagnosis not present

## 2016-09-26 DIAGNOSIS — J449 Chronic obstructive pulmonary disease, unspecified: Secondary | ICD-10-CM | POA: Diagnosis not present

## 2016-09-26 MED ORDER — ALBUTEROL SULFATE HFA 108 (90 BASE) MCG/ACT IN AERS: INHALATION_SPRAY | RESPIRATORY_TRACT | 11 refills | Status: DC

## 2016-09-26 NOTE — Progress Notes (Signed)
Subjective:    Patient ID: Karen Osborn, female    DOB: 30-Apr-1941     MRN: [id number]      Brief patient profile:  75 year old active smoker  presents for evaluation of COPD on inlalers since Fall 2016 with GOLD III criteria documented 04/24/16    Dr Bari Mantis eval  04/24/16 She reports dyspnea on exertion especially on climbing stairs, she is able to walk on level ground and carry groceries from the store. 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 She reports increased dyspnea during spring and fall. She reports an occasional cough productive of minimal milky sputum, denies frequent chest colds or wheezing. There is no history of chest pain, orthopnea or paroxysmal nocturnal dyspnea  She was trialed on multiple medications- Breo caused dryness of mouth, Symbicort and stiolto tore up her stomach.  She smoked about a pack per day starting around age 16, more than 50 pack years, now a pack lasts her a day and a half. She is a breast cancer survivor from 2006. Spirometry was a good effort, showed severe airway obstruction with FEV1 32% with ratio 43 and FVC of 57% rec You have severe COPD-lung function is at 30% Trial of Spiriva Respimat 2 puffs daily morning Use pro-air  2 puffs every 6 hours as needed for shortness of breath Chest x-ray today Referral to pulmonary rehabilitation Have to quit smoking! We discussed nicotine patch 14 mg /d    09/26/2016 acute extended ov/Wert re: copd/ adverse drug reaction / still smoking  Chief Complaint  Patient presents with  . Acute Visit    Pt states about a month after starting spiriva respimat 04/24/16 she started having lip swelling and trouble swallowing. She stopped med for approx 1 wk and symptoms improved and then worsened again when she started back on it. Her breathing is unchanged since her last visit.   while on spiriva the need for the proair went  down considerably but then had to stop the spiriva and some more saba use since stopped but no more than twice daily  Doe = MMRC1 = can walk nl pace, flat grade, can't hurry or go uphills or steps s sob    No obvious day to day or daytime variability or assoc excess/ purulent sputum or mucus plugs or hemoptysis or cp or chest tightness, subjective wheeze or overt sinus or hb symptoms. No unusual exp hx or h/o childhood pna/ asthma or knowledge of premature birth.  Sleeping ok without nocturnal  or early am exacerbation  of respiratory  c/o's or need for noct saba. Also denies any obvious fluctuation of symptoms with weather or environmental changes or other aggravating or alleviating factors except as outlined above   Current Medications, Allergies, Complete Past Medical History, Past Surgical History, Family History, and Social History were reviewed in [employer] record.  ROS  The following are not active complaints unless bolded sore throat, dysphagia, dental problems, itching, sneezing,  nasal congestion or excess/ purulent secretions, ear ache,   fever, chills, sweats, unintended wt loss, classically pleuritic or exertional cp,  orthopnea pnd or leg swelling, presyncope, palpitations, abdominal pain, anorexia, nausea, vomiting, diarrhea  or change in bowel or bladder habits, change in stools or urine, dysuria,hematuria,  rash, arthralgias, visual complaints, headache, numbness, weakness or ataxia or problems with walking or coordination,  change in mood/affect or memory.  Objective:   Physical Exam   think amb hoarse wf nad   Wt Readings from Last 3 Encounters:  09/26/16 72 lb (32.7 kg)  04/24/16 73 lb (33.1 kg)  02/18/13 76 lb 8 oz (34.7 kg)    Vital signs reviewed  - note sats 98% on RA on arrival    HEENT: nl dentition, turbinates, and oropharynx. Nl external ear canals without cough reflex   NECK :  without JVD/Nodes/TM/ nl carotid  upstrokes bilaterally   LUNGS: no acc muscle use,  Nl contour chest with distant bs bilaterally but no wheeze  CV:  RRR  no s3 or murmur or increase in P2, no edema   ABD:  soft and nontender with nl inspiratory excursion in the supine position. No bruits or organomegaly, bowel sounds nl  MS:  Nl gait/ ext warm without deformities, calf tenderness, cyanosis or clubbing No obvious joint restrictions   SKIN: warm and dry without lesions    NEURO:  alert, approp, nl sensorium with  no motor deficits     I personally reviewed images and agree with radiology impression as follows:  CXR:   04/24/16 No active disease.  Stable hyperinflation and emphysematous changes.           Assessment & Plan:

## 2016-09-26 NOTE — Patient Instructions (Signed)
The key is to stop smoking completely before smoking completely stops you!   Only use your albuterol (proair)  as a rescue medication to be used if you can't catch your breath by resting or doing a relaxed purse lip breathing pattern.  - The less you use it, the better it will work when you need it. - Ok to use up to 2 puffs  every 4 hours if you must but call for immediate appointment if use goes up over your usual need - Don't leave home without it !!  (think of it like the spare tire for your car)   Please schedule a follow up office visit in 4 weeks, sooner if needed  To see Dr Elsworth Soho or his NP

## 2016-09-27 NOTE — Assessment & Plan Note (Addendum)
PFT's  11/21/15  FEV1 0.83 (42 % ) ratio 47  p 18 % improvement from saba  - Spirometry 04/24/16   FEV1 0.60 (32%)  Ratio 43    Can't tol laba/ics and now LAMA apparently - could change to sama/saba (combivent) if need for albuterol or symptoms worsen but for now should focus os stopping smoking (see separate a/p)   - The proper method of use, as well as anticipated side effects, of a metered-dose inhaler were discussed and demonstrated to the patient.     I had an extended discussion with the patient reviewing all relevant studies completed to date and  lasting 15 to 20 minutes of a 25 minute visit    Each maintenance medication was reviewed in detail including most importantly the difference between maintenance and prns and under what circumstances the prns are to be triggered using an action plan format that is not reflected in the computer generated alphabetically organized AVS.    Please see instructions for details which were reviewed in writing and the patient given a copy highlighting the part that I personally wrote and discussed at today's ov.

## 2016-09-27 NOTE — Assessment & Plan Note (Signed)
>   3 min discussion  I emphasized that although we never turn away smokers from the pulmonary clinic, we do ask that they understand that the recommendations that we make  won't work nearly as well in the presence of continued cigarette exposure.    

## 2016-10-03 ENCOUNTER — Other Ambulatory Visit: Payer: Self-pay | Admitting: Family Medicine

## 2016-10-03 DIAGNOSIS — R1310 Dysphagia, unspecified: Secondary | ICD-10-CM

## 2016-10-03 DIAGNOSIS — E049 Nontoxic goiter, unspecified: Secondary | ICD-10-CM

## 2016-10-16 ENCOUNTER — Ambulatory Visit
Admission: RE | Admit: 2016-10-16 | Discharge: 2016-10-16 | Disposition: A | Discharge status: Home / Self Care | Payer: BC Managed Care – PPO | Source: Ambulatory Visit | Attending: Family Medicine | Admitting: Family Medicine

## 2016-10-16 DIAGNOSIS — E049 Nontoxic goiter, unspecified: Secondary | ICD-10-CM

## 2016-10-16 DIAGNOSIS — R1310 Dysphagia, unspecified: Secondary | ICD-10-CM

## 2016-10-23 ENCOUNTER — Encounter (HOSPITAL_COMMUNITY): Payer: Self-pay | Admitting: *Deleted

## 2016-10-23 ENCOUNTER — Other Ambulatory Visit: Payer: Self-pay | Admitting: Gastroenterology

## 2016-10-23 NOTE — Progress Notes (Signed)
Spoke with pt for pre-op call. Pt denies cardiac history, chest pain or sob. 

## 2016-10-24 ENCOUNTER — Ambulatory Visit (HOSPITAL_COMMUNITY): Payer: Medicare Other | Admitting: Certified Registered"

## 2016-10-24 ENCOUNTER — Encounter (HOSPITAL_COMMUNITY): Payer: Self-pay | Admitting: Certified Registered"

## 2016-10-24 ENCOUNTER — Ambulatory Visit: Payer: Medicare Other | Admitting: Pulmonary Disease

## 2016-10-24 ENCOUNTER — Ambulatory Visit (HOSPITAL_COMMUNITY)
Admission: RE | Admit: 2016-10-24 | Discharge: 2016-10-24 | Disposition: A | Discharge status: Home / Self Care | Payer: Medicare Other | Source: Ambulatory Visit | Attending: Gastroenterology | Admitting: Gastroenterology

## 2016-10-24 ENCOUNTER — Encounter (HOSPITAL_COMMUNITY)
Admission: RE | Disposition: A | Discharge status: Home / Self Care | Payer: Self-pay | Source: Ambulatory Visit | Attending: Gastroenterology

## 2016-10-24 DIAGNOSIS — R1310 Dysphagia, unspecified: Secondary | ICD-10-CM

## 2016-10-24 DIAGNOSIS — C50919 Malignant neoplasm of unspecified site of unspecified female breast: Secondary | ICD-10-CM | POA: Diagnosis not present

## 2016-10-24 DIAGNOSIS — C154 Malignant neoplasm of middle third of esophagus: Secondary | ICD-10-CM | POA: Insufficient documentation

## 2016-10-24 DIAGNOSIS — K219 Gastro-esophageal reflux disease without esophagitis: Secondary | ICD-10-CM | POA: Diagnosis not present

## 2016-10-24 DIAGNOSIS — Z885 Allergy status to narcotic agent status: Secondary | ICD-10-CM | POA: Insufficient documentation

## 2016-10-24 DIAGNOSIS — K449 Diaphragmatic hernia without obstruction or gangrene: Secondary | ICD-10-CM | POA: Insufficient documentation

## 2016-10-24 DIAGNOSIS — F17210 Nicotine dependence, cigarettes, uncomplicated: Secondary | ICD-10-CM | POA: Insufficient documentation

## 2016-10-24 DIAGNOSIS — J449 Chronic obstructive pulmonary disease, unspecified: Secondary | ICD-10-CM | POA: Diagnosis not present

## 2016-10-24 HISTORY — PX: ESOPHAGOGASTRODUODENOSCOPY (EGD) WITH PROPOFOL: SHX5813

## 2016-10-24 HISTORY — DX: Dyspnea, unspecified: R06.00

## 2016-10-24 HISTORY — DX: Gastro-esophageal reflux disease without esophagitis: K21.9

## 2016-10-24 SURGERY — ESOPHAGOGASTRODUODENOSCOPY (EGD) WITH PROPOFOL
Anesthesia: Monitor Anesthesia Care

## 2016-10-24 MED ORDER — BUTAMBEN-TETRACAINE-BENZOCAINE 2-2-14 % EX AERO
INHALATION_SPRAY | CUTANEOUS | Status: DC | PRN
  Administered 2016-10-24: 2 via TOPICAL

## 2016-10-24 MED ORDER — SODIUM CHLORIDE 0.9 % IV SOLN
INTRAVENOUS | Status: DC
  Administered 2016-10-24: 07:00:00 via INTRAVENOUS

## 2016-10-24 MED ORDER — PROPOFOL 10 MG/ML IV BOLUS
INTRAVENOUS | Status: DC | PRN
  Administered 2016-10-24: 10 mg via INTRAVENOUS
  Administered 2016-10-24: 20 mg via INTRAVENOUS
  Administered 2016-10-24 (×2): 10 mg via INTRAVENOUS

## 2016-10-24 MED ORDER — PROPOFOL 500 MG/50ML IV EMUL
INTRAVENOUS | Status: DC | PRN
  Administered 2016-10-24: 100 ug/kg/min via INTRAVENOUS

## 2016-10-24 NOTE — Discharge Instructions (Signed)
Will call you when the pathology results are complete and arrange additional testing based on those results.

## 2016-10-24 NOTE — Interval H&P Note (Signed)
History and Physical Interval Note:  10/24/2016 8:18 AM  Karen Osborn  has presented today for surgery, with the diagnosis of dysphagia  The various methods of treatment have been discussed with the patient and family. After consideration of risks, benefits and other options for treatment, the patient has consented to  Procedure(s): ESOPHAGOGASTRODUODENOSCOPY (EGD) WITH PROPOFOL (N/A) BALLOON DILATION (N/A) as a surgical intervention .  The patient's history has been reviewed, patient examined, no change in status, stable for surgery.  I have reviewed the patient's chart and labs.  Questions were answered to the patient's satisfaction.     Karen Osborn.

## 2016-10-24 NOTE — H&P (Signed)
Date of Initial H&P: 10/21/16  History reviewed, patient examined, no change in status, stable for surgery.

## 2016-10-24 NOTE — Transfer of Care (Signed)
Immediate Anesthesia Transfer of Care Note  Patient: MOLINE EKLUND  Procedure(s) Performed: Procedure(s): ESOPHAGOGASTRODUODENOSCOPY (EGD) WITH PROPOFOL (N/A) BALLOON DILATION (N/A)  Patient Location: Endoscopy Unit  Anesthesia Type:MAC  Level of Consciousness: awake, oriented and patient cooperative  Airway & Oxygen Therapy: Patient Spontanous Breathing and Patient connected to nasal cannula oxygen  Post-op Assessment: Report given to RN, Post -op Vital signs reviewed and stable and Patient moving all extremities  Post vital signs: Reviewed and stable  Last Vitals:  Vitals:   10/24/16 0651  BP: (!) 149/72  Pulse: 69  Resp: (!) 22  Temp: 36.6 C    Last Pain:  Vitals:   10/24/16 0651  TempSrc: Oral         Complications: No apparent anesthesia complications

## 2016-10-24 NOTE — Anesthesia Preprocedure Evaluation (Addendum)
Anesthesia Evaluation  Patient identified by MRN, date of birth, ID band Patient awake    Reviewed: Allergy & Precautions, NPO status , Patient's Chart, lab work & pertinent test results  History of Anesthesia Complications Negative for: history of anesthetic complications  Airway Mallampati: II  TM Distance: <3 FB Neck ROM: Limited    Dental  (+) Teeth Intact, Dental Advisory Given   Pulmonary shortness of breath and with exertion, COPD,  COPD inhaler, Current Smoker,    breath sounds clear to auscultation       Cardiovascular negative cardio ROS   Rhythm:Regular Rate:Normal     Neuro/Psych negative neurological ROS     GI/Hepatic Neg liver ROS, GERD  ,  Endo/Other  negative endocrine ROS  Renal/GU negative Renal ROS     Musculoskeletal   Abdominal   Peds  Hematology negative hematology ROS (+)   Anesthesia Other Findings Breast cancer  Reproductive/Obstetrics                           Anesthesia Physical Anesthesia Plan  ASA: III  Anesthesia Plan: MAC   Post-op Pain Management:    Induction: Intravenous  Airway Management Planned: Nasal Cannula  Additional Equipment: None  Intra-op Plan:   Post-operative Plan:   Informed Consent: I have reviewed the patients History and Physical, chart, labs and discussed the procedure including the risks, benefits and alternatives for the proposed anesthesia with the patient or authorized representative who has indicated his/her understanding and acceptance.   Dental advisory given  Plan Discussed with: CRNA, Anesthesiologist and Surgeon  Anesthesia Plan Comments: (Plan routine monitors, MAC)       Anesthesia Quick Evaluation

## 2016-10-24 NOTE — Anesthesia Postprocedure Evaluation (Signed)
Anesthesia Post Note  Patient: SHYLEE CLERE  Procedure(s) Performed: Procedure(s) (LRB): ESOPHAGOGASTRODUODENOSCOPY (EGD) WITH PROPOFOL (N/A) BALLOON DILATION (N/A)  Patient location during evaluation: Endoscopy Anesthesia Type: MAC Level of consciousness: awake and alert, patient cooperative and oriented Pain management: pain level controlled Vital Signs Assessment: post-procedure vital signs reviewed and stable Respiratory status: spontaneous breathing, nonlabored ventilation and respiratory function stable Cardiovascular status: blood pressure returned to baseline and stable Postop Assessment: no signs of nausea or vomiting Anesthetic complications: no    Last Vitals:  Vitals:   10/24/16 0910 10/24/16 0920  BP: (!) 168/86 (!) 168/66  Pulse: 75 76  Resp: (!) 24 20  Temp:      Last Pain:  Vitals:   10/24/16 0850  TempSrc: Oral                 Trynity Skousen,E. Larsen Dungan

## 2016-10-24 NOTE — Op Note (Addendum)
[facility] Patient Name: Karen Osborn Procedure Date : 10/24/2016 MRN: [id number] Attending MD: Lear Ng , MD Date of Birth: July 08, 1941 CSN: [id number] Age: 75 Admit Type: Outpatient Procedure:                Upper GI endoscopy Indications:              Dysphagia, Abnormal cine-esophagram Providers:                Lear Ng, MD, Carolynn Comment, RN,                            Elspeth Cho Tech., Technician, Claybon Jabs, CRNA Referring MD:              Medicines:                Propofol per Anesthesia, Monitored Anesthesia Care Complications:            No immediate complications. Estimated Blood Loss:     Estimated blood loss was minimal. Procedure:                Pre-Anesthesia Assessment:                           - Prior to the procedure, a History and Physical                            was performed, and patient medications and                            allergies were reviewed. The patient's tolerance of                            previous anesthesia was also reviewed. The risks                            and benefits of the procedure and the sedation                            options and risks were discussed with the patient.                            All questions were answered, and informed consent                            was obtained. Prior Anticoagulants: The patient has                            taken no previous anticoagulant or antiplatelet                            agents. ASA Grade Assessment: III - A patient with                            severe systemic disease. After reviewing the risks  and benefits, the patient was deemed in                            satisfactory condition to undergo the procedure.                           After obtaining informed consent, the endoscope was                            passed under direct vision. Throughout the                            procedure, the  patient's blood pressure, pulse, and                            oxygen saturations were monitored continuously. The                            [id number]) scope was introduced through the                            mouth, and advanced to the second part of duodenum.                            The upper GI endoscopy was somewhat difficult due                            to a partially obstructing mass. Successful                            completion of the procedure was aided by                            straightening and shortening the scope to obtain                            bowel loop reduction and performing the maneuvers                            documented (below) in this report. The patient                            tolerated the procedure well. Scope In: Scope Out: Findings:      A large, fungating mass with no bleeding and no stigmata of recent       bleeding was found in the middle third of the esophagus, 28 to 34 cm       from the incisors. The mass was partially obstructing and       circumferential. Biopsies were taken with a cold forceps for histology.       Mild resistance during passage of the standard adult endoscope (9.8 mm       diameter). Estimated blood loss was minimal.      The Z-line was regular and was found 40 cm from the incisors.  A small hiatal hernia was present.      The exam of the stomach was otherwise normal.      The examined duodenum was normal. 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                            Biopsied.                           - Z-line regular, 40 cm from the incisors.                           - Small hiatal hernia.                           - Normal examined duodenum. Moderate Sedation:      N/A - MAC procedure Recommendation:           - Await pathology results.                           - Return to  GI office in 2 weeks.                           - Perform a CT scan (computed tomography) of chest                            with contrast, abdomen with contrast and pelvis                            with contrast at appointment to be scheduled.                           - Full liquid diet.                           - Continue present medications. Procedure Code(s):        --- Professional ---                           [phone], Esophagogastroduodenoscopy, flexible,                            transoral; with biopsy, single or multiple Diagnosis Code(s):        --- Professional ---                           R13.10, Dysphagia, unspecified                           C15.4, Malignant neoplasm of middle third of  esophagus                           R93.3, Abnormal findings on diagnostic imaging of                            other parts of digestive tract                           K44.9, Diaphragmatic hernia without obstruction or                            gangrene CPT copyright 2016 [employer]. All rights reserved. The codes documented in this report are preliminary and upon coder review may  be revised to meet current compliance requirements. Lear Ng, MD 10/24/2016 8:58:22 AM This report has been signed electronically. Number of Addenda: 0

## 2016-10-25 ENCOUNTER — Other Ambulatory Visit: Payer: Self-pay | Admitting: Gastroenterology

## 2016-10-25 DIAGNOSIS — C159 Malignant neoplasm of esophagus, unspecified: Secondary | ICD-10-CM

## 2016-10-29 ENCOUNTER — Ambulatory Visit
Admission: RE | Admit: 2016-10-29 | Discharge: 2016-10-29 | Disposition: A | Discharge status: Home / Self Care | Payer: Medicare Other | Source: Ambulatory Visit | Attending: Gastroenterology | Admitting: Gastroenterology

## 2016-10-29 DIAGNOSIS — C159 Malignant neoplasm of esophagus, unspecified: Secondary | ICD-10-CM

## 2016-10-29 MED ORDER — IOPAMIDOL (ISOVUE-300) INJECTION 61%
75.0000 mL | Freq: Once | INTRAVENOUS | Status: AC | PRN
  Administered 2016-10-29: 75 mL via INTRAVENOUS

## 2016-10-29 NOTE — Progress Notes (Signed)
GI Location of Tumor / Histology:Esophageal cancer( mid third of esophagus)   Karen Osborn presented  months ago with symptoms of: dysphasia ,difficulty swallowing foods/past 4 weeks     Biopsies of  (if applicable) revealed: Diagnosis 10/24/2016 Esophagus, biopsy - INVASIVE SQUAMOUS CELL CARCINOMA  Past/Anticipated interventions by surgeon, if [id number] Gi Endoscopy Dr. Wilford Corner, MD  Past/Anticipated interventions by medical oncology, if any: Dr. Benay Spice 11/01/16  Weight changes, if any: 15 lb weight loss , started on boost 1 day, loss appetite, difficulty swallowing foods, takes small bites can get down,  Bowel/Bladder complaints, if any: regular bowel, bladder, regular  Nausea / Vomiting, if any: no, has reflux regurg of food when she eats too fast  Pain issues, if any:  Throat pain  Any blood per rectum:   SAFETY ISSUES:  Prior radiation?yes,  Left breast 02/21/15-04/05/2005  64Gy total dose Dr. Tammi Klippel  Pacemaker/ICD? NO  Possible current pregnancy?N/A  Is the patient on methotrexate? NO  Current Complaints/Details: Left mastectomy 2006, Current cigarette smoker,  1ppd, no alcohol, no illicit drug use,   Allergies: Sulfa, Codeine, Amoxicillin,  BP 115/77 (BP Location: Right Arm, Patient Position: Sitting, Cuff Size: Normal)   Pulse 85   Temp 98.7 F (37.1 C) (Oral)   Resp 20   Ht 5\' 1"  (1.549 m)   Wt 70 lb (31.8 kg)   SpO2 100% Comment: roo m air  BMI 13.23 kg/m   Wt Readings from Last 3 Encounters:  10/30/16 70 lb (31.8 kg)  10/24/16 72 lb (32.7 kg)  09/26/16 72 lb (32.7 kg)

## 2016-10-30 ENCOUNTER — Encounter: Payer: Self-pay | Admitting: Radiation Oncology

## 2016-10-30 ENCOUNTER — Ambulatory Visit
Admission: RE | Admit: 2016-10-30 | Discharge: 2016-10-30 | Disposition: A | Discharge status: Home / Self Care | Payer: Medicare Other | Source: Ambulatory Visit | Attending: Radiation Oncology | Admitting: Radiation Oncology

## 2016-10-30 ENCOUNTER — Encounter: Payer: Self-pay | Admitting: *Deleted

## 2016-10-30 VITALS — BP 115/77 | HR 85 | Temp 98.7°F | Resp 20 | Ht 61.0 in | Wt <= 1120 oz

## 2016-10-30 DIAGNOSIS — Z885 Allergy status to narcotic agent status: Secondary | ICD-10-CM | POA: Diagnosis not present

## 2016-10-30 DIAGNOSIS — C154 Malignant neoplasm of middle third of esophagus: Secondary | ICD-10-CM

## 2016-10-30 DIAGNOSIS — Z8249 Family history of ischemic heart disease and other diseases of the circulatory system: Secondary | ICD-10-CM | POA: Diagnosis not present

## 2016-10-30 DIAGNOSIS — Z853 Personal history of malignant neoplasm of breast: Secondary | ICD-10-CM | POA: Diagnosis not present

## 2016-10-30 DIAGNOSIS — Z79899 Other long term (current) drug therapy: Secondary | ICD-10-CM | POA: Diagnosis not present

## 2016-10-30 DIAGNOSIS — Z87898 Personal history of other specified conditions: Secondary | ICD-10-CM | POA: Insufficient documentation

## 2016-10-30 DIAGNOSIS — R1310 Dysphagia, unspecified: Secondary | ICD-10-CM | POA: Insufficient documentation

## 2016-10-30 DIAGNOSIS — R634 Abnormal weight loss: Secondary | ICD-10-CM | POA: Insufficient documentation

## 2016-10-30 DIAGNOSIS — Z7951 Long term (current) use of inhaled steroids: Secondary | ICD-10-CM | POA: Insufficient documentation

## 2016-10-30 DIAGNOSIS — F17210 Nicotine dependence, cigarettes, uncomplicated: Secondary | ICD-10-CM | POA: Insufficient documentation

## 2016-10-30 DIAGNOSIS — J449 Chronic obstructive pulmonary disease, unspecified: Secondary | ICD-10-CM | POA: Insufficient documentation

## 2016-10-30 DIAGNOSIS — K219 Gastro-esophageal reflux disease without esophagitis: Secondary | ICD-10-CM | POA: Insufficient documentation

## 2016-10-30 DIAGNOSIS — Z923 Personal history of irradiation: Secondary | ICD-10-CM | POA: Diagnosis not present

## 2016-10-30 DIAGNOSIS — Z510 Encounter for antineoplastic radiation therapy: Secondary | ICD-10-CM | POA: Insufficient documentation

## 2016-10-30 DIAGNOSIS — Z681 Body mass index (BMI) 19 or less, adult: Secondary | ICD-10-CM | POA: Diagnosis not present

## 2016-10-30 DIAGNOSIS — Z803 Family history of malignant neoplasm of breast: Secondary | ICD-10-CM | POA: Insufficient documentation

## 2016-10-30 DIAGNOSIS — Z808 Family history of malignant neoplasm of other organs or systems: Secondary | ICD-10-CM | POA: Insufficient documentation

## 2016-10-30 NOTE — Progress Notes (Signed)
Oncology Nurse Navigator Documentation  Oncology Nurse Navigator Flowsheets 10/30/2016  Navigator Location [facility]  Referral date to RadOnc/MedOnc 10/28/2016  Navigator Encounter Type Initial RadOnc  Abnormal Finding Date 10/16/2016  Confirmed Diagnosis Date 10/24/2016  Patient Visit Type RadOnc;Initial  Treatment Phase Pre-Tx/Tx Discussion  Barriers/Navigation Needs Coordination of Care  Interventions Coordination of Care--in basket note to Dr. Paulita Fujita for EUS as soon as possible  Coordination of Care Appts--moved her GI [place] visit to 1130 with Dr. Benay Spice  Acuity Level 2  Time Spent with Patient 15

## 2016-10-30 NOTE — Progress Notes (Signed)
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   HISTORY OF PRESENT ILLNESS: Karen Osborn is a 75 y.o. female seen at the request of Dr. Michail Sermon for a new diagnosis of mid-esophageal cancer. The patient has lost about 15 pounds in the last 2-3 years. Her baseline weight is 85 pounds, and she is currently 70 pounds. She has noticed increasing dysphagia and odynophagia with solids. She sought care with Dr. Michail Sermon who proceeded with upper endoscopy on 10/24/16 which revealed a large, fungating mass with no bleeding and no stigmata of recent bleeding  found in the middle third of the esophagus, 28 to 34 cm from the incisors. The mass was partially obstructing and circumferential. 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 She had a CT scan of the c/a/p yesterday revealing thickening of the middle esophagus, with 53mm adenopathy of the retroperitoneum, there is a sclerotic lesion of the right seventh lateral rib, two hypodense enhancing nodules of the posterior right hepatic lobe measuring 11 and 7 mm. She comes today to review these findings and for recommendations of care with Dr. Lisbeth Renshaw to discuss the role of radiation for her cancer. She is meeting with Dr. Benay Spice on Friday of this week as well.    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 Breast cancer ([place])    left  . COPD (chronic obstructive pulmonary disease) ([place])   . Dyspnea   . GERD (gastroesophageal reflux disease)        PAST SURGICAL HISTORY: Past Surgical History:  Procedure Laterality Date  . BREAST  LUMPECTOMY Left 1/132006  . ESOPHAGOGASTRODUODENOSCOPY (EGD) WITH PROPOFOL N/A 10/24/2016   Procedure: ESOPHAGOGASTRODUODENOSCOPY (EGD) WITH PROPOFOL;  Surgeon: Wilford Corner, MD;  Location: [facility] ENDOSCOPY;  Service: Endoscopy;  Laterality: N/A;     FAMILY HISTORY:  Family History  Problem Relation Age of Onset  . Heart attack Mother   . Heart attack Father   . Arrhythmia Sister   . Cancer Sister     Breast Cancer  . COPD Brother   . Cancer Sister     Skin Cancer, Breast Cancer     SOCIAL HISTORY:  reports that she has been smoking Cigarettes.  She has a 54.00 pack-year smoking history. She has never used smokeless tobacco. She reports that she does not drink alcohol or use drugs. The patient is married and lives in [place]. She is accompanied by her husband and daughter.   ALLERGIES: Codeine   MEDICATIONS:  Current Outpatient Prescriptions  Medication Sig Dispense Refill  . acetaminophen (TYLENOL) 500 MG tablet Take 500 mg by mouth every 6 (six) hours as needed.    Marland Kitchen albuterol (PROAIR HFA) 108 (90 Base) MCG/ACT inhaler 2 puffs every 4 hours as needed only  if your can't catch your breath 1 Inhaler 11  . budesonide-formoterol (SYMBICORT) 160-4.5 MCG/ACT inhaler Inhale 2 puffs into the lungs daily. Tries  4-5x week    . calcium carbonate (TUMS - DOSED IN MG ELEMENTAL CALCIUM) 500 MG chewable tablet Chew 1 tablet by mouth 2 (two) times daily.    Marland Kitchen  vitamin B-12 (CYANOCOBALAMIN) 1000 MCG tablet Take 1,000 mcg by mouth daily. Takes 4x week    . Vitamin D, Ergocalciferol, (DRISDOL) 50000 units CAPS capsule Take 50,000 Units by mouth every 7 (seven) days.    . Ibuprofen 200 MG CAPS Take by mouth as needed.     No current facility-administered medications for this encounter.      REVIEW OF SYSTEMS: On review of systems, the patient reports that she is doing ok. She reports poor appetite, and reports dysphagia with thick liquids and odynophagia with solids. She denies any chest pain,  shortness of breath, cough, fevers, chills, night sweats. She denies any bowel or bladder disturbances, and denies abdominal pain, nausea or vomiting. She denies any new musculoskeletal or joint aches or pains. A complete review of systems is obtained and is otherwise negative.     PHYSICAL EXAM:  Wt Readings from Last 3 Encounters:  10/30/16 70 lb (31.8 kg)  10/24/16 72 lb (32.7 kg)  09/26/16 72 lb (32.7 kg)   Temp Readings from Last 3 Encounters:  10/30/16 98.7 F (37.1 C) (Oral)  10/24/16 97.8 F (36.6 C) (Oral)  02/18/13 98.4 F (36.9 C) (Oral)   BP Readings from Last 3 Encounters:  10/30/16 115/77  10/24/16 (!) 168/66  09/26/16 112/80   Pulse Readings from Last 3 Encounters:  10/30/16 85  10/24/16 76  09/26/16 73     Pain scale 0/10 In general this is a thin, chronically ill appearing caucasian female in no acute distress. She is alert and oriented x4 and appropriate throughout the examination. HEENT reveals that the patient is normocephalic, atraumatic. EOMs are intact. PERRLA. Skin is intact without any evidence of gross lesions. Cardiovascular exam reveals a regular rate and rhythm, no clicks rubs or murmurs are auscultated. Chest is clear to auscultation bilaterally. Lymphatic assessment is performed and does not reveal any adenopathy in the cervical, supraclavicular, axillary, or inguinal chains. She does however have bony prominence along the left supraclavicular region which she reports has been there all her life. Abdomen has active bowel sounds in all quadrants and is intact. The abdomen is soft, non tender, non distended. Lower extremities are negative for pretibial pitting edema, deep calf tenderness, cyanosis or clubbing.   ECOG = 1  0 - Asymptomatic (Fully active, able to carry on all predisease activities without restriction)  1 - Symptomatic but completely ambulatory (Restricted in physically strenuous activity but ambulatory and able to carry out work of a  light or sedentary nature. For example, light housework, office work)  2 - Symptomatic, <50% in bed during the day (Ambulatory and capable of all self care but unable to carry out any work activities. Up and about more than 50% of waking hours)  3 - Symptomatic, >50% in bed, but not bedbound (Capable of only limited self-care, confined to bed or chair 50% or more of waking hours)  4 - Bedbound (Completely disabled. Cannot carry on any self-care. Totally confined to bed or chair)  5 - Death   Eustace Pen MM, Creech RH, Tormey DC, et al. [phone]). "Toxicity and response criteria of the [employer]". [place] Oncol. 5 (6): 649-55    LABORATORY DATA:  Lab Results  Component Value Date   WBC 5.0 12/30/2011   HGB 14.6 12/30/2011   HCT 42.4 12/30/2011   MCV 92.4 12/30/2011   PLT 166 12/30/2011   Lab Results  Component Value Date   NA 139 12/30/2011   K 4.2  12/30/2011   CL 104 12/30/2011   CO2 28 12/30/2011   Lab Results  Component Value Date   ALT 9 12/30/2011   AST 16 12/30/2011   ALKPHOS 68 12/30/2011   BILITOT 0.5 12/30/2011      RADIOGRAPHY: Ct Chest W Contrast  Result Date: 10/29/2016 CLINICAL DATA:  Esophageal cancer, history of breast cancer dysphagia EXAM: CT CHEST, ABDOMEN, AND PELVIS WITH CONTRAST TECHNIQUE: Multidetector CT imaging of the chest, abdomen and pelvis was performed following the standard protocol during bolus administration of intravenous contrast. CONTRAST:  13mL ISOVUE-300 IOPAMIDOL (ISOVUE-300) INJECTION 61% COMPARISON:  None. FINDINGS: CT CHEST FINDINGS Cardiovascular: Mild ectasia of the ascending aorta, measuring up to 3.2 cm in diameter. No dissection is seen. There are coronary artery calcifications. Bovine arch variant. Dense atherosclerotic vascular calcification at the origin of the left subclavian artery with stenosis and mural thrombus present in the proximal vessel. Heart size upper normal. No effusion. Mediastinum/Nodes:  Image thyroid gland unremarkable. The trachea and mainstem bronchi are within normal limits. No axillary adenopathy. No significant mediastinal or hilar adenopathy. There is marked esophageal wall thickening of the midesophagus, presumably related to the history of esophageal cancer. Lungs/Pleura: Extensive emphysematous disease bilaterally with mild subpleural fibrosis in the lingula. No nodules. No effusion, consolidation, or pneumothorax. Musculoskeletal: Mild heterogeneous attenuation of the ribs. Small sclerotic lesion in the right seventh rib laterally. Preserved vertebral body stature. CT ABDOMEN PELVIS FINDINGS Hepatobiliary: 2 adjacent hypodense possible peripherally enhancing nodules in the posterior right hepatic lobe, measuring 11 mm and 0.7 cm. These are not identified on delayed images. Additional focal hypodensity, nonspecific, anteriorly adjacent to the falciform ligament measuring 11 mm. No other focal hepatic abnormalities. The gallbladder is contracted. Extrahepatic common duct upper normal. Pancreas: No inflammatory changes. Mild prominence of the pancreatic duct near the head of pancreas measuring 2 mm. Spleen: Normal in size without focal abnormality. Adrenals/Urinary Tract: 2 cm hypodense lesion lower pole right kidney, non simple attenuation value. 1 cm hypodense lesion posterior mid left kidney, non simple attenuation value. Mild to moderate hydronephrosis of the right kidney with dilated extrarenal pelvis. No dilated ureter. Bilateral adrenal glands are within normal limits. The bladder is not well visualized and may be empty. Stomach/Bowel: Prominent loops of contrast filled small bowel without obstruction. Large amount of stool in the right colon. Vascular/Lymphatic: Dense atherosclerosis of the aorta. No obvious aneurysm. Mild retroperitoneal adenopathy with lymph nodes measuring up to 0.9 cm. Reproductive: Triangular soft tissue density in the right posterior pelvis presumably  represents the uterus. It is slightly enlarged for age. No adnexal masses. Other: There is no free air or free fluid. Musculoskeletal: No acute osseous abnormality. IMPRESSION: 1. Moderate-to-marked esophageal wall thickening of the mid esophagus, presumably related to the history of esophageal cancer. 2. Extensive emphysematous disease of the bilateral lungs. No nodules are visualized. 3. Hypodense nodules within the posterior right hepatic lobe an adjacent to falciform ligament, question hemangioma given progressive fill-in on delayed images, however consider correlation with MRI. 4. Slightly prominent pancreatic duct at the head of the pancreas. 5. Hypodense lesions within both kidneys suggestive of cysts however density values do not support this. This may also be evaluated at MRI. 6. Mild retroperitoneal adenopathy 7. Heterogeneous attenuation of the bilateral ribs, possibly related to osteopenia. Isolated sclerotic lesion in the right seventh rib, nonspecific. Consider correlation with bone scan. 8. Mild right hydronephrosis with dilated extra renal pelvis. Electronically Signed   By: Donavan Foil M.D.   On:  10/29/2016 19:35   Ct Abdomen Pelvis W Contrast  Result Date: 10/29/2016 CLINICAL DATA:  Esophageal cancer, history of breast cancer dysphagia EXAM: CT CHEST, ABDOMEN, AND PELVIS WITH CONTRAST TECHNIQUE: Multidetector CT imaging of the chest, abdomen and pelvis was performed following the standard protocol during bolus administration of intravenous contrast. CONTRAST:  102mL ISOVUE-300 IOPAMIDOL (ISOVUE-300) INJECTION 61% COMPARISON:  None. FINDINGS: CT CHEST FINDINGS Cardiovascular: Mild ectasia of the ascending aorta, measuring up to 3.2 cm in diameter. No dissection is seen. There are coronary artery calcifications. Bovine arch variant. Dense atherosclerotic vascular calcification at the origin of the left subclavian artery with stenosis and mural thrombus present in the proximal vessel. Heart size  upper normal. No effusion. Mediastinum/Nodes: Image thyroid gland unremarkable. The trachea and mainstem bronchi are within normal limits. No axillary adenopathy. No significant mediastinal or hilar adenopathy. There is marked esophageal wall thickening of the midesophagus, presumably related to the history of esophageal cancer. Lungs/Pleura: Extensive emphysematous disease bilaterally with mild subpleural fibrosis in the lingula. No nodules. No effusion, consolidation, or pneumothorax. Musculoskeletal: Mild heterogeneous attenuation of the ribs. Small sclerotic lesion in the right seventh rib laterally. Preserved vertebral body stature. CT ABDOMEN PELVIS FINDINGS Hepatobiliary: 2 adjacent hypodense possible peripherally enhancing nodules in the posterior right hepatic lobe, measuring 11 mm and 0.7 cm. These are not identified on delayed images. Additional focal hypodensity, nonspecific, anteriorly adjacent to the falciform ligament measuring 11 mm. No other focal hepatic abnormalities. The gallbladder is contracted. Extrahepatic common duct upper normal. Pancreas: No inflammatory changes. Mild prominence of the pancreatic duct near the head of pancreas measuring 2 mm. Spleen: Normal in size without focal abnormality. Adrenals/Urinary Tract: 2 cm hypodense lesion lower pole right kidney, non simple attenuation value. 1 cm hypodense lesion posterior mid left kidney, non simple attenuation value. Mild to moderate hydronephrosis of the right kidney with dilated extrarenal pelvis. No dilated ureter. Bilateral adrenal glands are within normal limits. The bladder is not well visualized and may be empty. Stomach/Bowel: Prominent loops of contrast filled small bowel without obstruction. Large amount of stool in the right colon. Vascular/Lymphatic: Dense atherosclerosis of the aorta. No obvious aneurysm. Mild retroperitoneal adenopathy with lymph nodes measuring up to 0.9 cm. Reproductive: Triangular soft tissue density in  the right posterior pelvis presumably represents the uterus. It is slightly enlarged for age. No adnexal masses. Other: There is no free air or free fluid. Musculoskeletal: No acute osseous abnormality. IMPRESSION: 1. Moderate-to-marked esophageal wall thickening of the mid esophagus, presumably related to the history of esophageal cancer. 2. Extensive emphysematous disease of the bilateral lungs. No nodules are visualized. 3. Hypodense nodules within the posterior right hepatic lobe an adjacent to falciform ligament, question hemangioma given progressive fill-in on delayed images, however consider correlation with MRI. 4. Slightly prominent pancreatic duct at the head of the pancreas. 5. Hypodense lesions within both kidneys suggestive of cysts however density values do not support this. This may also be evaluated at MRI. 6. Mild retroperitoneal adenopathy 7. Heterogeneous attenuation of the bilateral ribs, possibly related to osteopenia. Isolated sclerotic lesion in the right seventh rib, nonspecific. Consider correlation with bone scan. 8. Mild right hydronephrosis with dilated extra renal pelvis. Electronically Signed   By: Donavan Foil M.D.   On: 10/29/2016 19:35   Dg Esophagus  Result Date: 10/16/2016 CLINICAL DATA:  Dysphagia EXAM: ESOPHOGRAM/BARIUM SWALLOW TECHNIQUE: Single contrast examination was performed using  thin barium. FLUOROSCOPY TIME:  Fluoroscopy Time:  2 minutes 54 seconds Radiation Exposure Index (  if provided by the fluoroscopic device): 19 deciGy per square cm Number of Acquired Spot Images: 0 COMPARISON:  Chest x-ray of 04/24/2016 FINDINGS: Initially rapid sequence spot films of the cervical esophagus were performed. The swallowing mechanism is unremarkable. No penetration or aspiration is seen. However, there is an area of persistent narrowing of the mid lower thoracic esophagus several cms below the aortic knob and several cms above the gastroesophageal junction worrisome for  esophageal carcinoma. There is some mucosal irregularity noted at this site as well. There is decreased primary peristaltic motion present. No hiatal hernia is seen. No gastroesophageal reflux is demonstrated. A barium pill was not given at the end of the study due to the mid lower thoracic esophageal narrowing. IMPRESSION: 1. Persistent irregular narrowing of the mid lower thoracic esophagus worrisome for esophageal carcinoma. 2. Diminished primary esophageal peristalsis. Electronically Signed   By: Ivar Drape M.D.   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 COMPARISON:  None. FINDINGS: Parenchymal Echotexture: Normal Estimated total number of nodules >/= 1 cm: 0 Number of spongiform nodules >/=  2 cm not described below (TR1): 0 Number of mixed cystic and solid nodules >/= 1.5 cm not described below (TR2): 0 _________________________________________________________ Isthmus: 0.3 cm No discrete nodules are identified within the thyroid isthmus. _________________________________________________________ Right lobe: 4.0 x 1.4 x 1.2 cm No discrete nodules are identified within the right lobe of the thyroid. _________________________________________________________ Left lobe: 3.2 x 0.9 x 0.9 cm No discrete nodules are identified within the left lobe of the thyroid. IMPRESSION: Unremarkable sonographic evaluation of the thyroid gland. Electronically Signed   By: Jacqulynn Cadet M.D.   On: 10/16/2016 13:25       IMPRESSION/PLAN: 1. Squamous cell carcinoma of the esophagus. Dr. Lisbeth Renshaw discusses the findings on pathology and reviews her CT imaging. He recommends proceeding with additional work up with a PET scan and EUS with Dr. Paulita Fujita. Orders are placed for these. Dr. Lisbeth Renshaw outlines the concerns that her CT  shows with possible adenopathy in the retroperitoneum, and also discusses the findings in the right seventh rib, and again reiterates the role for PET scan. We reviewed the role for radiotherapy as a form of treatment if there does not appear to be advanced stage disease, we would offer her radiotherapy with concurrent chemotherapy. We discussed the risks, benefits, short, and long term effects of radiotherapy. He reviews logistics and delivery of treatment of 28 fractions over 5 1/2 weeks. If she has more advanced disease, there would be a lesser role for curative radiotherapy, and could be more palliative in nature. At the end of the conversation, the patient is interested in proceeding. We will coordinate her PET and EUS, and meet back to discuss these options  2. Weight loss. The patient is trying to increase her intake of protein drinks, and Dr. Lisbeth Renshaw recommends proceeding with an evaluation with nutrition, and is very encouraging of the patient to try and do as much as possible to increase her caloric intake, perhaps even up to 6 ensure or boost supplements a day. The patient is counseled as well on the very likely need for PEG/jejunostomy tube placement. We will further discuss this once her PET has resulted, and she will try to increase her oral intake to maintain her weight in the meantime.  The above documentation reflects  my direct findings during this shared patient visit. Please see the separate note by Dr. Lisbeth Renshaw on this date for the remainder of the patient's plan of care.    Carola Rhine, PAC

## 2016-10-31 ENCOUNTER — Telehealth: Payer: Self-pay | Admitting: *Deleted

## 2016-10-31 NOTE — Telephone Encounter (Signed)
CALLED PATIENT TO INFORM OF NUTRITION APPT. ON 11-04-16 @ 9:45 AM, SPOKE WITH PATIENT AND SHE IS AWARE OF THIS APPT.

## 2016-10-31 NOTE — Telephone Encounter (Signed)
CALLED PATIENT TO INFORM OF PET SCAN  FOR 11-06-16- ARRIVAL TIME - 12:30 PM , PT. TO BE NPO - 6 HRS. PRIOR TEST, @ [facility], SPOKE WITH PATIENT AND SHE IS AWARE OF THIS TEST.

## 2016-11-01 ENCOUNTER — Ambulatory Visit (HOSPITAL_BASED_OUTPATIENT_CLINIC_OR_DEPARTMENT_OTHER): Payer: Medicare Other | Admitting: Oncology

## 2016-11-01 ENCOUNTER — Encounter: Payer: Self-pay | Admitting: *Deleted

## 2016-11-01 ENCOUNTER — Telehealth: Payer: Self-pay | Admitting: Oncology

## 2016-11-01 ENCOUNTER — Telehealth: Payer: Self-pay | Admitting: *Deleted

## 2016-11-01 VITALS — BP 144/73 | HR 79 | Temp 98.2°F | Resp 16 | Ht 61.0 in | Wt 71.1 lb

## 2016-11-01 DIAGNOSIS — R634 Abnormal weight loss: Secondary | ICD-10-CM

## 2016-11-01 DIAGNOSIS — J449 Chronic obstructive pulmonary disease, unspecified: Secondary | ICD-10-CM | POA: Diagnosis not present

## 2016-11-01 DIAGNOSIS — R1310 Dysphagia, unspecified: Secondary | ICD-10-CM

## 2016-11-01 DIAGNOSIS — C154 Malignant neoplasm of middle third of esophagus: Secondary | ICD-10-CM

## 2016-11-01 MED ORDER — DEXAMETHASONE 2 MG PO TABS: 10.0000 mg | ORAL_TABLET | ORAL | 0 refills | Status: DC

## 2016-11-01 MED ORDER — PROCHLORPERAZINE MALEATE 5 MG PO TABS: 5.0000 mg | ORAL_TABLET | Freq: Four times a day (QID) | ORAL | 1 refills | Status: DC | PRN

## 2016-11-01 NOTE — Patient Instructions (Signed)
Care Plan Summary- 11/01/2016 Name: Karen Osborn      DOB: 06/02/1941 Your Medical Team:  Medical Oncologist:  Dr. Ma Rings Radiation Oncologist:  Dr. Kyung Rudd  Surgeon:    Type of Cancer: Squamous Cell Cancer of Esophagus  Stage/Grade: Undetermined  *Exact staging of your cancer is based on size of the tumor, depth of invasion, involvement of lymph nodes or not, and whether or not the cancer has spread beyond the primary site   Recommendations: Based on information available as of today's consult. Recommendations may change depending on the results of further tests or exams. 1) Radiation therapy M-F for 5  weeks  2)  Weekly chemo with Taxol/Carboplatin X 5 cycles (start 11/14) 3)    Next Steps: 1) Simulation 11/04/16 and PET scan 11/04/16 as scheduled 2) See dietician on 11/04/16 at 0945 as scheduled 3) Chemo class . Return to [facility] 11/19/16 with 2nd chemo ______________________________________________________________________________Questions? Merceda Elks, RN, BSN at [phone]. Karen Osborn is your Oncology Nurse Navigator and is available to assist you while you're receiving our medical care at [facility]

## 2016-11-01 NOTE — Progress Notes (Signed)
Karen Osborn Consult   Referring MD: Toniette Stiger 75 y.o.  Apr 23, 1941    Reason for Referral: Esophagus cancer   HPI: Karen Osborn reports solid dysphagia for the past 4-6 weeks. The dysphagia has progressed and she now has to drink liquids slowly. She was referred to Dr. Michail Sermon and was taken to an upper endoscopy procedure 10/24/2016. A mass was found in the middle third of the esophagus at 20 07/31/1931 centimeters from the incisors. The mass was partially obstructing. Biopsies were obtained. The stomach and duodenum appeared normal. The pathology [id number] revealed invasive squamous cell carcinoma.  She underwent CTs of the chest, abdomen, and pelvis on 10/29/2016. No adenopathy. Marked esophageal wall thickening was noted in the midesophagus. Extensive changes of emphysema bilaterally. No nodules. 2 adjacent hypodense lesions in the right liver and an 11 mm lesion adjacent to the thousand 14 ligament. Mild to moderate hydronephrosis on the right kidney with a dilated extrarenal pelvis. No dilated ureter. Retroperitoneal lymph nodes measure up to 0.9 cm. The liver lesions are felt to potentially represent hemangiomas. Hypodense lesions in the kidneys are suggestive of cyst, though density level did not support this.  She was referred to Dr. Lisbeth Renshaw and is scheduled for radiation simulation on 11/04/2016.        [id number]  Past Medical History:  Diagnosis Date  . Breast cancer (HCC)-Treated with a lumpectomy, radiation, and tamoxifen  2006    left  . COPD (chronic obstructive pulmonary disease) ([place])   . G1 P1    . GERD (gastroesophageal reflux disease)     Past Surgical History:  Procedure Laterality Date  . BREAST LUMPECTOMY Left 1/132006  . ESOPHAGOGASTRODUODENOSCOPY (EGD) WITH PROPOFOL N/A 10/24/2016   Procedure: ESOPHAGOGASTRODUODENOSCOPY (EGD) WITH PROPOFOL;  Surgeon: Wilford Corner, MD;  Location: [facility] ENDOSCOPY;   Service: Endoscopy;  Laterality: N/A;    Medications: Reviewed  Allergies:  Allergies  Allergen Reactions  . Codeine Nausea And Vomiting    Family history: 3 sisters had breast cancer. One of the sisters also had ovarian cancer. She reports one of the sisters tested positive for a genetic syndrome.  Social History:   She lives with her husband in [place]. She is a retired Chiropodist. She smokes cigarettes. She denies cough. No risk factor for HIV or hepatitis.  History  Alcohol Use No    History  Smoking Status  . Current Every Day Smoker  . Packs/day: 1.00  . Years: 54.00  . Types: Cigarettes  Smokeless Tobacco  . Never Used      ROS:   Positives include:15 pound weight loss, anorexia, solid dysphagia, difficulty drinking liquids unless she drinks slowly, chronic exertional dyspnea  A complete ROS was otherwise negative.  Physical Exam:  Blood pressure (!) 144/73, pulse 79, temperature 98.2 F (36.8 C), temperature source Oral, resp. rate 16, height 5\' 1"  (1.549 m), weight 71 lb 1.6 oz (32.3 kg), SpO2 100 %.  HEENT: Oropharynx without visible mass, neck without mass Lungs: Clear bilaterally, no respiratory distress Cardiac: Regular rate and rhythm, 2/6 systolic murmur Abdomen: No hepatosplenomegaly, nontender, no mass  Vascular: No leg edema Lymph nodes: No cervical, supraclavicular, or inguinal nodes. "Shotty "left axillary nodes. Neurologic: Alert and oriented, the motor exam appears intact in the upper and lower extremities Skin: No rash Musculoskeletal: No spine tenderness   LAB: CBC and CMET to be obtained on the day of the chemotherapy teaching class  X-rays:  CT abdomen/pelvis  10/29/2016-images reviewed   Assessment/Plan:   1. Squamous cell carcinoma the midesophagus  Staging CT scans 10/29/2016 with no evidence of metastatic disease; nonspecific liver lesions, right hydronephrosis, retroperitoneal lymph nodes, and sclerotic right  seventh rib lesion  2.   COPD  3.   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   Right hydronephrosis on the CT 10/29/2016  5.   Dysphagia/weight loss secondary to #1   Disposition:   Karen Osborn has been diagnosed with squamous cell carcinoma of the esophagus. She appears to have localized disease based on the staging evaluation to date. Dr. Lisbeth Renshaw has scheduled a PET scan to further evaluate the liver lesions and new lymph nodes.  I discussed treatment options with Ms. Kittell and her family. I recommend concurrent chemotherapy and radiation. She does not appear to be a surgical candidate based on the advanced COPD. I will review her case with thoracic surgery.  I recommend weekly Taxol/carboplatin and concurrent radiation. We reviewed the potential toxicities associated with this regimen including the chance for nausea/vomiting, mucositis, diarrhea, alopecia, and hematologic toxicity. We discussed the neuropathy, allergic reaction, and bone pain associated with Taxol. We discussed the potential for an allergic reaction with carboplatin. She agrees to proceed. She will attend a chemotherapy teaching class.  Ms. Westby will be referred to the genetics counselor based on the family history of breast and ovarian cancer.  We anticipate radiation to begin on 11/11/2016. She will be scheduled for a first cycle of Taxol/carboplatin on 11/12/2016. Ms. Darocha will return for an office visit on 11/19/2016.  Approximately 50 minutes were spent with the Osborn today. The majority of the time was used for counseling and coordination of care.  Betsy Coder, MD  11/01/2016, 2:20 PM

## 2016-11-01 NOTE — Progress Notes (Signed)
Oncology Nurse Navigator Documentation  Oncology Nurse Navigator Flowsheets 11/01/2016  Navigator Location [facility]  Referral date to RadOnc/MedOnc -  Navigator Encounter Type Clinic/MDC  Abnormal Finding Date -  Confirmed Diagnosis Date -  Multidisiplinary Clinic Date 11/01/2016  Patient Visit Type MedOnc;Initial  Treatment Phase Pre-Tx/Tx Discussion  Barriers/Navigation Needs Education;Coordination of Care  Education Newly Diagnosed Cancer Education;Understanding Cancer/ Treatment Options;Preparing for Upcoming  Treatment  Interventions Education;Coordination of Care  Coordination of Care Appts--scheduled chemo class on day of PET to lessen driving  Education Method Verbal;Written;Teach-back  Support Groups/Services GI Support Group;[place];Other--[facility]  Acuity Level 2  Time Spent with Patient 5  Met with patient, husband and daughter during new patient visit. Explained the role of the GI Nurse Navigator and provided New Patient Packet with information on: 1. Esophagus cancer--information on high protein supplements and where to purchase; flyer on dysphagia diet, and coupons for supplements 2. Support groups 3. Advanced Directives 4. Fall Safety Plan Answered questions, reviewed current treatment plan using TEACH back and provided emotional support. Provided copy of current treatment plan. Plan to start RT on 11/13 with 1st chemo treatment on 11/14. She will be meeting with dietician on 11/04/16 prior to her simulation visit. She is aware of potential need for feeding tube-G-tube vs J-tube based on her resectability. Dr. Benay Spice will have Dr. Servando Snare review her case.   Karen Elks, RN, BSN GI Oncology [place]

## 2016-11-01 NOTE — Progress Notes (Signed)
[facility] Psychosocial Distress Screening Clinical Social Work  CSW met with pt; "Karen Osborn", her husband "Nadara Mustard" and daughter, "Museum/gallery conservator" at GI clinic to review role of CSW/Support Team, discuss resources and review distress screening protocol.  The patient scored a 2 on the Psychosocial Distress Thermometer which indicates no distress. Clinical Social Worker met with pt and family briefly to assess for distress and other psychosocial needs. Pt denied current concerns other than her inability to breathe. Pt reports this was addressed with MD today. Pt feels her distress decreased after her plan was discussed. Pt seemed very reserved and not open to much discussion at today's visit. Pt and family deny issues with transportation as well. CSW encouraged them to reach out as needed.   ONCBCN DISTRESS SCREENING 11/01/2016  Screening Type Initial Screening  Distress experienced in past week (1-10) 2  Physical Problem type Breathing  Physician notified of physical symptoms Yes  Referral to [place] Worker follow up needed: No.  If yes, follow up plan:  Loren Racer, [place]  [facility] Phone: [phone] Fax: [phone]

## 2016-11-01 NOTE — Telephone Encounter (Signed)
Appointments scheduled per 11/01/16 los. AVS report and appointment schedule given to patient per 11/01/16 los.

## 2016-11-01 NOTE — Addendum Note (Signed)
Addended by: Rosalio Macadamia C on: 11/01/2016 04:01 PM   Modules accepted: Orders

## 2016-11-01 NOTE — Telephone Encounter (Signed)
Per LOS I have scheduled appts and notified the scheduler 

## 2016-11-01 NOTE — Telephone Encounter (Signed)
Left message on voicemail informing pt that jury duty letter is ready to be picked up.

## 2016-11-04 ENCOUNTER — Ambulatory Visit: Payer: Medicare Other | Admitting: Nutrition

## 2016-11-04 ENCOUNTER — Ambulatory Visit
Admission: RE | Admit: 2016-11-04 | Discharge: 2016-11-04 | Disposition: A | Discharge status: Home / Self Care | Payer: Medicare Other | Source: Ambulatory Visit | Attending: Radiation Oncology | Admitting: Radiation Oncology

## 2016-11-04 ENCOUNTER — Other Ambulatory Visit (HOSPITAL_BASED_OUTPATIENT_CLINIC_OR_DEPARTMENT_OTHER): Payer: Medicare Other

## 2016-11-04 DIAGNOSIS — Z510 Encounter for antineoplastic radiation therapy: Secondary | ICD-10-CM | POA: Diagnosis not present

## 2016-11-04 DIAGNOSIS — C154 Malignant neoplasm of middle third of esophagus: Secondary | ICD-10-CM

## 2016-11-04 LAB — CBC WITH DIFFERENTIAL/PLATELET
BASO%: 0.7 % (ref 0.0–2.0)
BASOS ABS: 0.1 10*3/uL (ref 0.0–0.1)
EOS ABS: 0.1 10*3/uL (ref 0.0–0.5)
EOS%: 1.5 % (ref 0.0–7.0)
HEMATOCRIT: 40.6 % (ref 34.8–46.6)
HEMOGLOBIN: 13.5 g/dL (ref 11.6–15.9)
LYMPH#: 0.9 10*3/uL (ref 0.9–3.3)
LYMPH%: 12 % — ABNORMAL LOW (ref 14.0–49.7)
MCH: 30.4 pg (ref 25.1–34.0)
MCHC: 33.2 g/dL (ref 31.5–36.0)
MCV: 91.5 fL (ref 79.5–101.0)
MONO#: 0.6 10*3/uL (ref 0.1–0.9)
MONO%: 7.6 % (ref 0.0–14.0)
NEUT#: 5.7 10*3/uL (ref 1.5–6.5)
NEUT%: 78.2 % — ABNORMAL HIGH (ref 38.4–76.8)
Platelets: 203 10*3/uL (ref 145–400)
RBC: 4.44 10*6/uL (ref 3.70–5.45)
RDW: 13.2 % (ref 11.2–14.5)
WBC: 7.3 10*3/uL (ref 3.9–10.3)

## 2016-11-04 LAB — COMPREHENSIVE METABOLIC PANEL
ALT: 11 U/L (ref 0–55)
ANION GAP: 8 meq/L (ref 3–11)
AST: 19 U/L (ref 5–34)
Albumin: 3.5 g/dL (ref 3.5–5.0)
Alkaline Phosphatase: 78 U/L (ref 40–150)
BILIRUBIN TOTAL: 0.3 mg/dL (ref 0.20–1.20)
BUN: 19.2 mg/dL (ref 7.0–26.0)
CHLORIDE: 99 meq/L (ref 98–109)
CO2: 29 meq/L (ref 22–29)
Calcium: 9.4 mg/dL (ref 8.4–10.4)
Creatinine: 0.6 mg/dL (ref 0.6–1.1)
EGFR: 90 mL/min/{1.73_m2} — AB (ref >90)
Glucose: 94 mg/dl (ref 70–140)
Potassium: 4.3 mEq/L (ref 3.5–5.1)
Sodium: 136 mEq/L (ref 136–145)
Total Protein: 6.7 g/dL (ref 6.4–8.3)

## 2016-11-04 NOTE — Progress Notes (Signed)
75 year old female diagnosed with esophageal cancer to receive chemotherapy and radiation treatments.  She is a patient of Dr. Julieanne Manson.  Past medical history includes breast cancer in 2006, COPD, GERD, and tobacco.  Medications include Decadron, Compazine, vitamin B12, and vitamin D.  Labs were reviewed.  Height: 5 feet 1 inch. Weight: 71.1 pounds November 3. Usual body weight: 85 pounds approximately 6 months ago. BMI: 13.43.  Patient reports she has never weighed very much.  Patient weighed 84 pounds in 2010 and this is a normal weight for her. She has solid food dysphasia but seems to swallow liquids without difficulty. She denies nausea, diarrhea, and constipation. She will vomit if she eats too much, too fast. She's been drinking boost high-protein, 3 bottles daily along with some soft food.  Patient meets criteria for severe malnutrition in the context of chronic illness secondary to less than 75% energy intake for greater than one month, severe depletion of body fat and muscle mass and 16% weight loss over the past 6 months.  Estimated nutrition needs: 1200-1400 calories, 48-60 grams protein, 1.4 L fluid.  Nutrition diagnosis:  Malnutrition related to esophageal cancer and inadequate oral intake as evidenced by BMI 13.43 and loss of muscle and fat stores.  Intervention: Educated patient to increase calories and protein in small frequent meals and snacks. Recommended for boost plus or equivalent, 4 daily with ice cream or yogurt as desired. Provided fact sheets on ways to add calories and protein along with recipes for shakes. Also provided patient with samples of Ensure Enlive and boost plus along with coupons. Answered patient's questions.  Teach back method was used.  Contact information was given.  Monitoring, evaluation, goals:  Patient will tolerate increased calories and protein to minimize weight loss.  Next visit: Tuesday, November 14, during  infusion.  **Disclaimer: This note was dictated with voice recognition software. Similar sounding words can inadvertently be transcribed and this note may contain transcription errors which may not have been corrected upon publication of note.**

## 2016-11-06 ENCOUNTER — Ambulatory Visit (HOSPITAL_COMMUNITY): Payer: Medicare Other

## 2016-11-06 ENCOUNTER — Encounter: Payer: Self-pay | Admitting: *Deleted

## 2016-11-06 ENCOUNTER — Telehealth: Payer: Self-pay | Admitting: *Deleted

## 2016-11-06 ENCOUNTER — Other Ambulatory Visit: Payer: Self-pay | Admitting: *Deleted

## 2016-11-06 ENCOUNTER — Other Ambulatory Visit: Payer: Medicare Other

## 2016-11-06 NOTE — Telephone Encounter (Signed)
Patient notified that Dr. Benay Spice would like to cancel appts on 11/12/16 and begin chemo on 11/19/16 at currently scheduled appointment times.  Patient appreciative of call and has no questions at this time.

## 2016-11-06 NOTE — Progress Notes (Signed)
Patient scheduled for PET scan today and forgot that she was to remain NPO for 6 hours prior to scan and ate a nutri grain bar at 0530 and a smoothie and nutri grain bar at 1145. Radiology notified and PET scan canceled for today and rescheduled for Wednesday, 11/13/16 at 1:00PM.  Gaspar Garbe RN notified of above and states that patient's XRT treatments will be pushed back one week to begin on Monday, 11/18/16 per Dr. Lisbeth Renshaw.  Dr. Benay Spice notified of above information.  Called patient with above information.  Patient instructed to remain NPO after 0700 except for sips of water and to arrive at [facility] radiology at 1230 for PET on 11/13/16.  Patient appreciative of call and has no questions at this time.

## 2016-11-06 NOTE — Telephone Encounter (Signed)
Returned call to [employer]  after speaking with Dr. Lisbeth Renshaw, since patient didn't get Pet scan today and rescheduled for 11/13/16, we will not start her treatment until 11/18/16 per Dr. Lisbeth Renshaw, thanked Lorriane Shire RN for calling us  With this information, went and let Marya Amsler know, our dosimetrist and also spoke with Faith, RT therapist to cancel next Monday's start date and to call patient with new schedule starting 01/19/16 3:26 PM

## 2016-11-10 ENCOUNTER — Other Ambulatory Visit: Payer: Self-pay | Admitting: Oncology

## 2016-11-11 ENCOUNTER — Ambulatory Visit: Payer: Medicare Other

## 2016-11-12 ENCOUNTER — Encounter: Payer: Medicare Other | Admitting: Genetic Counselor

## 2016-11-12 ENCOUNTER — Ambulatory Visit: Payer: Medicare Other

## 2016-11-12 ENCOUNTER — Ambulatory Visit: Payer: Medicare Other | Admitting: Pulmonary Disease

## 2016-11-12 ENCOUNTER — Encounter: Payer: Medicare Other | Admitting: Nutrition

## 2016-11-13 ENCOUNTER — Ambulatory Visit: Payer: Medicare Other

## 2016-11-13 ENCOUNTER — Ambulatory Visit (HOSPITAL_COMMUNITY)
Admission: RE | Admit: 2016-11-13 | Discharge: 2016-11-13 | Disposition: A | Discharge status: Home / Self Care | Payer: Medicare Other | Source: Ambulatory Visit | Attending: Radiation Oncology | Admitting: Radiation Oncology

## 2016-11-13 DIAGNOSIS — I2510 Atherosclerotic heart disease of native coronary artery without angina pectoris: Secondary | ICD-10-CM | POA: Insufficient documentation

## 2016-11-13 DIAGNOSIS — C154 Malignant neoplasm of middle third of esophagus: Secondary | ICD-10-CM | POA: Insufficient documentation

## 2016-11-13 DIAGNOSIS — C771 Secondary and unspecified malignant neoplasm of intrathoracic lymph nodes: Secondary | ICD-10-CM | POA: Diagnosis not present

## 2016-11-13 DIAGNOSIS — I700 Atherosclerosis of aorta: Secondary | ICD-10-CM | POA: Diagnosis not present

## 2016-11-13 DIAGNOSIS — C770 Secondary and unspecified malignant neoplasm of lymph nodes of head, face and neck: Secondary | ICD-10-CM | POA: Insufficient documentation

## 2016-11-13 LAB — GLUCOSE, CAPILLARY: Glucose-Capillary: 92 mg/dL (ref 65–99)

## 2016-11-13 MED ORDER — FLUDEOXYGLUCOSE F - 18 (FDG) INJECTION
5.1800 | Freq: Once | INTRAVENOUS | Status: AC | PRN
  Administered 2016-11-13: 5.18 via INTRAVENOUS

## 2016-11-14 ENCOUNTER — Ambulatory Visit: Payer: Medicare Other

## 2016-11-15 ENCOUNTER — Ambulatory Visit: Payer: Medicare Other

## 2016-11-17 ENCOUNTER — Other Ambulatory Visit: Payer: Self-pay | Admitting: Oncology

## 2016-11-17 DIAGNOSIS — Z510 Encounter for antineoplastic radiation therapy: Secondary | ICD-10-CM | POA: Diagnosis not present

## 2016-11-18 ENCOUNTER — Ambulatory Visit: Payer: Medicare Other | Admitting: Radiation Oncology

## 2016-11-19 ENCOUNTER — Encounter: Payer: Self-pay | Admitting: *Deleted

## 2016-11-19 ENCOUNTER — Other Ambulatory Visit (HOSPITAL_BASED_OUTPATIENT_CLINIC_OR_DEPARTMENT_OTHER): Payer: Medicare Other

## 2016-11-19 ENCOUNTER — Ambulatory Visit: Payer: Medicare Other | Admitting: Radiation Oncology

## 2016-11-19 ENCOUNTER — Ambulatory Visit (HOSPITAL_BASED_OUTPATIENT_CLINIC_OR_DEPARTMENT_OTHER): Payer: Medicare Other | Admitting: Nurse Practitioner

## 2016-11-19 ENCOUNTER — Ambulatory Visit: Payer: Medicare Other | Admitting: Nutrition

## 2016-11-19 ENCOUNTER — Ambulatory Visit: Payer: Medicare Other

## 2016-11-19 VITALS — BP 128/71 | HR 105 | Temp 97.9°F | Resp 18 | Ht 61.0 in | Wt 71.1 lb

## 2016-11-19 DIAGNOSIS — Z86000 Personal history of in-situ neoplasm of breast: Secondary | ICD-10-CM

## 2016-11-19 DIAGNOSIS — C154 Malignant neoplasm of middle third of esophagus: Secondary | ICD-10-CM | POA: Diagnosis present

## 2016-11-19 DIAGNOSIS — C184 Malignant neoplasm of transverse colon: Secondary | ICD-10-CM

## 2016-11-19 DIAGNOSIS — N1330 Unspecified hydronephrosis: Secondary | ICD-10-CM | POA: Diagnosis not present

## 2016-11-19 DIAGNOSIS — J449 Chronic obstructive pulmonary disease, unspecified: Secondary | ICD-10-CM | POA: Diagnosis not present

## 2016-11-19 DIAGNOSIS — R634 Abnormal weight loss: Secondary | ICD-10-CM

## 2016-11-19 DIAGNOSIS — R1310 Dysphagia, unspecified: Secondary | ICD-10-CM | POA: Diagnosis not present

## 2016-11-19 LAB — CBC WITH DIFFERENTIAL/PLATELET
BASO%: 0.4 % (ref 0.0–2.0)
Basophils Absolute: 0 10*3/uL (ref 0.0–0.1)
EOS%: 0.4 % (ref 0.0–7.0)
Eosinophils Absolute: 0 10*3/uL (ref 0.0–0.5)
HCT: 47.2 % — ABNORMAL HIGH (ref 34.8–46.6)
HGB: 15.4 g/dL (ref 11.6–15.9)
LYMPH%: 5.2 % — AB (ref 14.0–49.7)
MCH: 30.3 pg (ref 25.1–34.0)
MCHC: 32.7 g/dL (ref 31.5–36.0)
MCV: 92.6 fL (ref 79.5–101.0)
MONO#: 0 10*3/uL — ABNORMAL LOW (ref 0.1–0.9)
MONO%: 0.5 % (ref 0.0–14.0)
NEUT#: 5.4 10*3/uL (ref 1.5–6.5)
NEUT%: 93.5 % — AB (ref 38.4–76.8)
PLATELETS: 210 10*3/uL (ref 145–400)
RBC: 5.1 10*6/uL (ref 3.70–5.45)
RDW: 13.2 % (ref 11.2–14.5)
WBC: 5.8 10*3/uL (ref 3.9–10.3)
lymph#: 0.3 10*3/uL — ABNORMAL LOW (ref 0.9–3.3)

## 2016-11-19 LAB — COMPREHENSIVE METABOLIC PANEL
ALT: 17 U/L (ref 0–55)
ANION GAP: 12 meq/L — AB (ref 3–11)
AST: 19 U/L (ref 5–34)
Albumin: 3.9 g/dL (ref 3.5–5.0)
Alkaline Phosphatase: 103 U/L (ref 40–150)
BUN: 20.9 mg/dL (ref 7.0–26.0)
CHLORIDE: 100 meq/L (ref 98–109)
CO2: 25 meq/L (ref 22–29)
CREATININE: 0.7 mg/dL (ref 0.6–1.1)
Calcium: 10.5 mg/dL — ABNORMAL HIGH (ref 8.4–10.4)
EGFR: 84 mL/min/{1.73_m2} — ABNORMAL LOW (ref >90)
Glucose: 218 mg/dl — ABNORMAL HIGH (ref 70–140)
POTASSIUM: 4 meq/L (ref 3.5–5.1)
Sodium: 137 mEq/L (ref 136–145)
Total Bilirubin: 0.5 mg/dL (ref 0.20–1.20)
Total Protein: 7.9 g/dL (ref 6.4–8.3)

## 2016-11-19 MED ORDER — DEXAMETHASONE 2 MG PO TABS: 10.0000 mg | ORAL_TABLET | ORAL | 0 refills | Status: DC

## 2016-11-19 NOTE — Progress Notes (Signed)
Call received from Shona Simpson PA in XRT to inform Dr. Benay Spice that prior Josem Kaufmann is pending for XRT, so patient's treatments will be delayed until next Monday.  Dr. Benay Spice notified.

## 2016-11-19 NOTE — Progress Notes (Addendum)
  [facility] OFFICE PROGRESS NOTE   Diagnosis: Esophagus cancer    INTERVAL HISTORY:   Ms. Marcussen returns as scheduled. She is tolerating liquids and some soft solids. She estimates 3 nutritional supplements per day. No change in baseline dyspnea. No significant cough. No fever.  Objective:  Vital signs in last 24 hours:  Blood pressure 128/71, pulse (!) 105, temperature 97.9 F (36.6 C), temperature source Oral, resp. rate 18, height 5\' 1"  (1.549 m), weight 71 lb 1.6 oz (32.3 kg), SpO2 98 %.    HEENT: Mild white coating on tongue. No buccal thrush. Resp: Lungs clear bilaterally. Cardio: Regular rate and rhythm. GI: Abdomen soft and nontender. No hepatomegaly. Vascular: No leg edema.   Lab Results:  Lab Results  Component Value Date   WBC 5.8 11/19/2016   HGB 15.4 11/19/2016   HCT 47.2 (H) 11/19/2016   MCV 92.6 11/19/2016   PLT 210 11/19/2016   NEUTROABS 5.4 11/19/2016    Imaging:  No results found.  Medications: I have reviewed the patient's current medications.  Assessment/Plan: 1. Squamous cell carcinoma the midesophagus ? Staging CT scans 10/29/2016 with no evidence of metastatic disease; nonspecific liver lesions, right hydronephrosis, retroperitoneal lymph nodes, and sclerotic right seventh rib lesion ? Staging PET scan 11/13/2016-low left jugular/supraclavicular node measuring 5 mm and SUV max of 2.6; left paratracheal node measuring 8 mm and SUV max 5.8; periaortic nodes including a 6 mm node SUV max 4.4; right retrocrural hypermetabolic node measures 8 mm and SUV max of 9.4; hypermetabolism corresponding to the mid esophageal primary SUV max 15.6; no right hepatic lobe hypermetabolism; no hypermetabolism to correspond to the retroperitoneal mild adenopathy on prior exam.  2.   COPD  3.   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   Right hydronephrosis on the  CT 10/29/2016  5.   Dysphagia/weight loss secondary to #1   Disposition: Ms. Schisler appears stable. She is scheduled to begin concurrent radiation/chemotherapy today. Her insurance has not approved the radiation as of today. The current plan is for her to begin radiation next week. We will cancel chemotherapy today and reschedule to coincide with the start of radiation. A new prescription for dexamethasone was sent to her pharmacy.  We reviewed the PET scan result with Ms. Venezia and her family at today's visit.  We will see her in follow-up prior to week 2 Taxol/carboplatin 12/03/2016. She will contact the office in the interim with any problems.  Patient seen with Dr. Benay Spice.    Ned Card ANP/GNP-BC   11/19/2016  9:29 AM  This was a shared visit with Ned Card. The start of chemotherapy/radiation will be delayed for one week secondary to delayed insurance approval for radiation.  Julieanne Manson, M.D.

## 2016-11-19 NOTE — Progress Notes (Signed)
Nutrition follow-up completed with patient and her husband. Patient's treatment for esophageal cancer has been delayed until next week. Weight is stable at 71.1 pounds November 21. Patient continues to drink fairly thin liquids or eat soft foods. She is drinking 3 boost plus daily. No other nutrition impact symptoms.  Nutrition diagnosis: Malnutrition continues.  Intervention:  Educated patient to increase boost +4 cans if possible. Provided samples of protein powder along with additional nutrition supplements and coupons. Teach back method used.  Questions were answered.  Monitoring, evaluation, goals:  Patient will tolerate increased calories and protein to promote weight gain.  Next visit: Tuesday, November 28.  **Disclaimer: This note was dictated with voice recognition software. Similar sounding words can inadvertently be transcribed and this note may contain transcription errors which may not have been corrected upon publication of note.**

## 2016-11-20 ENCOUNTER — Ambulatory Visit: Payer: Medicare Other

## 2016-11-22 ENCOUNTER — Telehealth: Payer: Self-pay | Admitting: Oncology

## 2016-11-22 NOTE — Telephone Encounter (Signed)
Left message re 11/28 appointment -patient to get new schedule at 11/28 visit.

## 2016-11-25 ENCOUNTER — Ambulatory Visit
Admission: RE | Admit: 2016-11-25 | Discharge: 2016-11-25 | Disposition: A | Discharge status: Home / Self Care | Payer: Medicare Other | Source: Ambulatory Visit | Attending: Radiation Oncology | Admitting: Radiation Oncology

## 2016-11-25 ENCOUNTER — Telehealth: Payer: Self-pay | Admitting: *Deleted

## 2016-11-25 ENCOUNTER — Encounter: Payer: Self-pay | Admitting: Radiation Oncology

## 2016-11-25 VITALS — BP 129/75 | HR 75 | Temp 97.7°F | Resp 20 | Wt <= 1120 oz

## 2016-11-25 DIAGNOSIS — Z510 Encounter for antineoplastic radiation therapy: Secondary | ICD-10-CM | POA: Diagnosis not present

## 2016-11-25 DIAGNOSIS — C154 Malignant neoplasm of middle third of esophagus: Secondary | ICD-10-CM

## 2016-11-25 MED ORDER — SUCRALFATE 1 G PO TABS: 1.0000 g | ORAL_TABLET | Freq: Four times a day (QID) | ORAL | 2 refills | Status: DC

## 2016-11-25 MED ORDER — SONAFINE EX EMUL
1.0000 "application " | Freq: Two times a day (BID) | CUTANEOUS | Status: DC
  Administered 2016-11-25: 1 via TOPICAL

## 2016-11-25 NOTE — Telephone Encounter (Signed)
Left VM for patient that she is correct. Take #5 tablets=10 mg at 10 pm night before 1st chemo and at 6 am day of 1st chemo. Left direct # for navigator for her to call with any further questions.

## 2016-11-25 NOTE — Telephone Encounter (Signed)
Tried to call Marlynn Perking, no answer, and then I  left voice message on Dr. Gearldine Shown Nurse phone to call the patient concerning decadron rx, On 11/19/16 Ned Card wrote for decadron  2mg  tablet (10mg  total, ) the night before and again at 6am the day of first chemo, patient has a bottle rx that only reads : 2mg  decadron night before and day of chemotherapy, they are requesting clarification, plase call the patient as she starts Chemotherapy tomorrow, thank you,  2:46 PM

## 2016-11-25 NOTE — Telephone Encounter (Signed)
Message from [place], [place] in [place] reporting pt has questions about Decadron dose for chemo tomorrow. Left message on voicemail confirming instructions, requested she call the office to discuss.

## 2016-11-25 NOTE — Progress Notes (Signed)
Department of Radiation Oncology  Phone:  [phone] Fax:        [phone]  Weekly Treatment Note    Name: Karen Osborn Date: 11/25/2016 MRN: [id number] DOB: 1941/09/07   Diagnosis:     ICD-9-CM ICD-10-CM   1. Malignant neoplasm of middle third of esophagus (HCC) 150.4 C15.4 SONAFINE emulsion 1 application     Current dose: 1.8 Gy  Current fraction: 1   MEDICATIONS: Current Outpatient Prescriptions  Medication Sig Dispense Refill  . acetaminophen (TYLENOL) 500 MG tablet Take 500 mg by mouth every 6 (six) hours as needed.    Marland Kitchen albuterol (PROAIR HFA) 108 (90 Base) MCG/ACT inhaler 2 puffs every 4 hours as needed only  if your can't catch your breath 1 Inhaler 11  . budesonide-formoterol (SYMBICORT) 160-4.5 MCG/ACT inhaler Inhale 2 puffs into the lungs daily. Tries  4-5x week    . calcium carbonate (TUMS - DOSED IN MG ELEMENTAL CALCIUM) 500 MG chewable tablet Chew 1 tablet by mouth 2 (two) times daily.    Marland Kitchen dexamethasone (DECADRON) 2 MG tablet Take 5 tablets (10 mg total) by mouth as directed. Take at 10 pm night before 1st chemo and 6 am day of 1st chemo10 10 tablet 0  . prochlorperazine (COMPAZINE) 5 MG tablet Take 1 tablet (5 mg total) by mouth every 6 (six) hours as needed for nausea or vomiting. 60 tablet 1  . SPIRIVA RESPIMAT 2.5 MCG/ACT AERS INL 2 PFS INTO THE LUNGS D  5  . vitamin B-12 (CYANOCOBALAMIN) 1000 MCG tablet Take 1,000 mcg by mouth daily. Takes 4x week    . Vitamin D, Ergocalciferol, (DRISDOL) 50000 units CAPS capsule Take 50,000 Units by mouth every 7 (seven) days.    . Wound Dressings (SONAFINE) Apply 1 application topically 2 (two) times daily.    . sucralfate (CARAFATE) 1 g tablet Take 1 tablet (1 g total) by mouth 4 (four) times daily. 120 tablet 2   Current Facility-Administered Medications  Medication Dose Route Frequency Provider Last Rate Last Dose  . SONAFINE emulsion 1 application  1 application Topical BID Hayden Pedro, [place]   1  application at [id number]     ALLERGIES: Codeine   LABORATORY DATA:  Lab Results  Component Value Date   WBC 5.8 11/19/2016   HGB 15.4 11/19/2016   HCT 47.2 (H) 11/19/2016   MCV 92.6 11/19/2016   PLT 210 11/19/2016   Lab Results  Component Value Date   NA 137 11/19/2016   K 4.0 11/19/2016   CL 104 12/30/2011   CO2 25 11/19/2016   Lab Results  Component Value Date   ALT 17 11/19/2016   AST 19 11/19/2016   ALKPHOS 103 11/19/2016   BILITOT 0.50 11/19/2016     NARRATIVE: Karen Osborn was seen today for weekly treatment management. The chart was checked and the patient's films were reviewed.  The patient has completed her first radiation treatment. She reports difficulty swallowing, and is eating only pudding and Boost at this time. She also reports that she "can't keep anything down."  PHYSICAL EXAMINATION: weight is 68 lb (30.8 kg). Her oral temperature is 97.7 F (36.5 C). Her blood pressure is 129/75 and her pulse is 75. Her respiration is 20.   ASSESSMENT: The patient is doing satisfactorily with treatment.  PLAN: We will continue with the patient's radiation treatment as planned. I will call in a prescription for Carafate to help alleviate the patient's pain when swallowing.  ------------------------------------------------  Jodelle Gross, MD, PhD  This document serves as a record of services personally performed by Kyung Rudd, MD. It was created on his behalf by Maryla Morrow, a trained medical scribe. The creation of this record is based on the scribe's personal observations and the provider's statements to them. This document has been checked and approved by the attending provider.

## 2016-11-25 NOTE — Progress Notes (Signed)
Weekly rad txs esohpagus, 1/28 completed, having difficulty swallowing now, eating only puddings and boost at this time,  Patient education done, sonafine cream, my business card, radiatio therapy and you book given, discussed ways to mange side effects, skin irritation, discoloration, fatigue, painful and difficulty swallowing, loss appetite,weight loss, may need to have IVF"S,for dehydration cough, loss chest hair, 2:28 PM BP 129/75 (BP Location: Left Arm, Patient Position: Sitting, Cuff Size: Small)   Pulse 75   Temp 97.7 F (36.5 C) (Oral)   Resp 20   Wt 68 lb (30.8 kg)   BMI 12.85 kg/m   Wt Readings from Last 3 Encounters:  11/25/16 68 lb (30.8 kg)  11/19/16 71 lb 1.6 oz (32.3 kg)  11/01/16 71 lb 1.6 oz (32.3 kg)

## 2016-11-26 ENCOUNTER — Ambulatory Visit
Admission: RE | Admit: 2016-11-26 | Discharge: 2016-11-26 | Disposition: A | Discharge status: Home / Self Care | Payer: Medicare Other | Source: Ambulatory Visit | Attending: Radiation Oncology | Admitting: Radiation Oncology

## 2016-11-26 ENCOUNTER — Encounter: Payer: Self-pay | Admitting: *Deleted

## 2016-11-26 ENCOUNTER — Other Ambulatory Visit: Payer: Medicare Other

## 2016-11-26 ENCOUNTER — Ambulatory Visit: Payer: Medicare Other | Admitting: Nutrition

## 2016-11-26 ENCOUNTER — Ambulatory Visit (HOSPITAL_BASED_OUTPATIENT_CLINIC_OR_DEPARTMENT_OTHER): Payer: Medicare Other

## 2016-11-26 ENCOUNTER — Telehealth: Payer: Self-pay | Admitting: Oncology

## 2016-11-26 VITALS — BP 126/54 | HR 77 | Temp 98.2°F | Resp 18

## 2016-11-26 DIAGNOSIS — C154 Malignant neoplasm of middle third of esophagus: Secondary | ICD-10-CM

## 2016-11-26 DIAGNOSIS — Z5111 Encounter for antineoplastic chemotherapy: Secondary | ICD-10-CM

## 2016-11-26 DIAGNOSIS — Z510 Encounter for antineoplastic radiation therapy: Secondary | ICD-10-CM | POA: Diagnosis not present

## 2016-11-26 MED ORDER — FAMOTIDINE IN NACL 20-0.9 MG/50ML-% IV SOLN
INTRAVENOUS | Status: AC
  Filled 2016-11-26: qty 50

## 2016-11-26 MED ORDER — SODIUM CHLORIDE 0.9 % IV SOLN
Freq: Once | INTRAVENOUS | Status: AC
  Administered 2016-11-26: 13:00:00 via INTRAVENOUS

## 2016-11-26 MED ORDER — DEXTROSE 5 % IV SOLN
50.0000 mg/m2 | Freq: Once | INTRAVENOUS | Status: AC
  Administered 2016-11-26: 60 mg via INTRAVENOUS
  Filled 2016-11-26: qty 10

## 2016-11-26 MED ORDER — SODIUM CHLORIDE 0.9 % IV SOLN
20.0000 mg | Freq: Once | INTRAVENOUS | Status: AC
  Administered 2016-11-26: 20 mg via INTRAVENOUS
  Filled 2016-11-26: qty 2

## 2016-11-26 MED ORDER — PALONOSETRON HCL INJECTION 0.25 MG/5ML
INTRAVENOUS | Status: AC
  Filled 2016-11-26: qty 5

## 2016-11-26 MED ORDER — DIPHENHYDRAMINE HCL 50 MG/ML IJ SOLN
25.0000 mg | Freq: Once | INTRAMUSCULAR | Status: AC
  Administered 2016-11-26: 25 mg via INTRAVENOUS

## 2016-11-26 MED ORDER — FAMOTIDINE IN NACL 20-0.9 MG/50ML-% IV SOLN
20.0000 mg | Freq: Once | INTRAVENOUS | Status: AC
  Administered 2016-11-26: 20 mg via INTRAVENOUS

## 2016-11-26 MED ORDER — DIPHENHYDRAMINE HCL 50 MG/ML IJ SOLN
INTRAMUSCULAR | Status: AC
  Filled 2016-11-26: qty 1

## 2016-11-26 MED ORDER — PALONOSETRON HCL INJECTION 0.25 MG/5ML
0.2500 mg | Freq: Once | INTRAVENOUS | Status: AC
  Administered 2016-11-26: 0.25 mg via INTRAVENOUS

## 2016-11-26 MED ORDER — SODIUM CHLORIDE 0.9 % IV SOLN
101.0000 mg | Freq: Once | INTRAVENOUS | Status: AC
  Administered 2016-11-26: 100 mg via INTRAVENOUS
  Filled 2016-11-26: qty 10

## 2016-11-26 NOTE — Progress Notes (Signed)
Okay to use lab work from 7 days ago per Lorriane Shire, Therapist, sports, per Dr. Benay Spice.

## 2016-11-26 NOTE — Progress Notes (Signed)
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 Weight has decreased and was documented as 68 pounds November 27, down 3 pounds in one week from 71.1 pounds November 21. Patient is severely malnourished. Patient reports she is only tolerating 3 to 3-1/2 bottles of boost plus daily. (1080 - 1260 calories) She does tolerate pudding but is no longer eating some of the other soft foods she was eating last week. She reports increased difficulty swallowing. She "works all day" to consume 3 and half boost and pudding.  Minimum estimated nutrition needs: 1200-1400 calories, 48-60 grams protein, 1.4 L fluid.  Nutrition diagnosis: Malnutrition continues.  Intervention: Educated patient on the importance of working to increase boost plus by mouth along with water intake. Patient concerned that swallowing is getting more difficult. Educated patient about possible feeding tube placement.  Consider PEG if patient is not a surgical candidate or surgical jejunostomy tube is patient will have surgery.  Monitoring, evaluation, goals:  Patient will tolerate oral intake to minimize further weight loss and consider feeding tube placement.  Next visit: Tuesday, December 5, during infusion.  **Disclaimer: This note was dictated with voice recognition software. Similar sounding words can inadvertently be transcribed and this note may contain transcription errors which may not have been corrected upon publication of note.**

## 2016-11-26 NOTE — Patient Instructions (Signed)
[facility] Discharge Instructions for Patients Receiving Chemotherapy  Today you received the following chemotherapy agents: Taxol and Carboplatin.  To help prevent nausea and vomiting after your treatment, we encourage you to take your nausea medication as directed.   If you develop nausea and vomiting that is not controlled by your nausea medication, call the clinic.   BELOW ARE SYMPTOMS THAT SHOULD BE REPORTED IMMEDIATELY:  *FEVER GREATER THAN 100.5 F  *CHILLS WITH OR WITHOUT FEVER  NAUSEA AND VOMITING THAT IS NOT CONTROLLED WITH YOUR NAUSEA MEDICATION  *UNUSUAL SHORTNESS OF BREATH  *UNUSUAL BRUISING OR BLEEDING  TENDERNESS IN MOUTH AND THROAT WITH OR WITHOUT PRESENCE OF ULCERS  *URINARY PROBLEMS  *BOWEL PROBLEMS  UNUSUAL RASH Items with * indicate a potential emergency and should be followed up as soon as possible.  Feel free to call the clinic you have any questions or concerns. The clinic phone number is [phone].  Please show the [facility] at check-in to the Emergency Department and triage nurse.  Carboplatin injection What is this medicine? CARBOPLATIN (KAR boe pla tin) is a chemotherapy drug. It targets fast dividing cells, like cancer cells, and causes these cells to die. This medicine is used to treat ovarian cancer and many other cancers. This medicine may be used for other purposes; ask your health care provider or pharmacist if you have questions. COMMON BRAND NAME(S): Paraplatin What should I tell my health care provider before I take this medicine? They need to know if you have any of these conditions: -blood disorders -hearing problems -kidney disease -recent or ongoing radiation therapy -an unusual or allergic reaction to carboplatin, cisplatin, other chemotherapy, other medicines, foods, dyes, or preservatives -pregnant or trying to get pregnant -breast-feeding How should I use this medicine? This drug is usually given as  an infusion into a vein. It is administered in a hospital or clinic by a specially trained health care professional. Talk to your pediatrician regarding the use of this medicine in children. Special care may be needed. Overdosage: If you think you have taken too much of this medicine contact a poison control center or emergency room at once. NOTE: This medicine is only for you. Do not share this medicine with others. What if I miss a dose? It is important not to miss a dose. Call your doctor or health care professional if you are unable to keep an appointment. What may interact with this medicine? -medicines for seizures -medicines to increase blood counts like filgrastim, pegfilgrastim, sargramostim -some antibiotics like amikacin, gentamicin, neomycin, streptomycin, tobramycin -vaccines Talk to your doctor or health care professional before taking any of these medicines: -acetaminophen -aspirin -ibuprofen -ketoprofen -naproxen This list may not describe all possible interactions. Give your health care provider a list of all the medicines, herbs, non-prescription drugs, or dietary supplements you use. Also tell them if you smoke, drink alcohol, or use illegal drugs. Some items may interact with your medicine. What should I watch for while using this medicine? Your condition will be monitored carefully while you are receiving this medicine. You will need important blood work done while you are taking this medicine. This drug may make you feel generally unwell. This is not uncommon, as chemotherapy can affect healthy cells as well as cancer cells. Report any side effects. Continue your course of treatment even though you feel ill unless your doctor tells you to stop. In some cases, you may be given additional medicines to help with side effects. Follow  all directions for their use. Call your doctor or health care professional for advice if you get a fever, chills or sore throat, or other  symptoms of a cold or flu. Do not treat yourself. This drug decreases your body's ability to fight infections. Try to avoid being around people who are sick. This medicine may increase your risk to bruise or bleed. Call your doctor or health care professional if you notice any unusual bleeding. Be careful brushing and flossing your teeth or using a toothpick because you may get an infection or bleed more easily. If you have any dental work done, tell your dentist you are receiving this medicine. Avoid taking products that contain aspirin, acetaminophen, ibuprofen, naproxen, or ketoprofen unless instructed by your doctor. These medicines may hide a fever. Do not become pregnant while taking this medicine. Women should inform their doctor if they wish to become pregnant or think they might be pregnant. There is a potential for serious side effects to an unborn child. Talk to your health care professional or pharmacist for more information. Do not breast-feed an infant while taking this medicine. What side effects may I notice from receiving this medicine? Side effects that you should report to your doctor or health care professional as soon as possible: -allergic reactions like skin rash, itching or hives, swelling of the face, lips, or tongue -signs of infection - fever or chills, cough, sore throat, pain or difficulty passing urine -signs of decreased platelets or bleeding - bruising, pinpoint red spots on the skin, black, tarry stools, nosebleeds -signs of decreased red blood cells - unusually weak or tired, fainting spells, lightheadedness -breathing problems -changes in hearing -changes in vision -chest pain -high blood pressure -low blood counts - This drug may decrease the number of white blood cells, red blood cells and platelets. You may be at increased risk for infections and bleeding. -nausea and vomiting -pain, swelling, redness or irritation at the injection site -pain, tingling,  numbness in the hands or feet -problems with balance, talking, walking -trouble passing urine or change in the amount of urine Side effects that usually do not require medical attention (report to your doctor or health care professional if they continue or are bothersome): -hair loss -loss of appetite -metallic taste in the mouth or changes in taste This list may not describe all possible side effects. Call your doctor for medical advice about side effects. You may report side effects to FDA at 1-800-FDA-1088. Where should I keep my medicine? This drug is given in a hospital or clinic and will not be stored at home. NOTE: This sheet is a summary. It may not cover all possible information. If you have questions about this medicine, talk to your doctor, pharmacist, or health care provider.  2017 Elsevier/Gold Standard (2008-03-22 14:38:05) Paclitaxel injection What is this medicine? PACLITAXEL (PAK li TAX el) is a chemotherapy drug. It targets fast dividing cells, like cancer cells, and causes these cells to die. This medicine is used to treat ovarian cancer, breast cancer, and other cancers. This medicine may be used for other purposes; ask your health care provider or pharmacist if you have questions. COMMON BRAND NAME(S): Onxol, Taxol What should I tell my health care provider before I take this medicine? They need to know if you have any of these conditions: -blood disorders -irregular heartbeat -infection (especially a virus infection such as chickenpox, cold sores, or herpes) -liver disease -previous or ongoing radiation therapy -an unusual or allergic reaction to paclitaxel,  alcohol, polyoxyethylated castor oil, other chemotherapy agents, other medicines, foods, dyes, or preservatives -pregnant or trying to get pregnant -breast-feeding How should I use this medicine? This drug is given as an infusion into a vein. It is administered in a hospital or clinic by a specially trained  health care professional. Talk to your pediatrician regarding the use of this medicine in children. Special care may be needed. Overdosage: If you think you have taken too much of this medicine contact a poison control center or emergency room at once. NOTE: This medicine is only for you. Do not share this medicine with others. What if I miss a dose? It is important not to miss your dose. Call your doctor or health care professional if you are unable to keep an appointment. What may interact with this medicine? Do not take this medicine with any of the following medications: -disulfiram -metronidazole This medicine may also interact with the following medications: -cyclosporine -diazepam -ketoconazole -medicines to increase blood counts like filgrastim, pegfilgrastim, sargramostim -other chemotherapy drugs like cisplatin, doxorubicin, epirubicin, etoposide, teniposide, vincristine -quinidine -testosterone -vaccines -verapamil Talk to your doctor or health care professional before taking any of these medicines: -acetaminophen -aspirin -ibuprofen -ketoprofen -naproxen This list may not describe all possible interactions. Give your health care provider a list of all the medicines, herbs, non-prescription drugs, or dietary supplements you use. Also tell them if you smoke, drink alcohol, or use illegal drugs. Some items may interact with your medicine. What should I watch for while using this medicine? Your condition will be monitored carefully while you are receiving this medicine. You will need important blood work done while you are taking this medicine. This medicine can cause serious allergic reactions. To reduce your risk you will need to take other medicine(s) before treatment with this medicine. If you experience allergic reactions like skin rash, itching or hives, swelling of the face, lips, or tongue, tell your doctor or health care professional right away. In some cases, you may  be given additional medicines to help with side effects. Follow all directions for their use. This drug may make you feel generally unwell. This is not uncommon, as chemotherapy can affect healthy cells as well as cancer cells. Report any side effects. Continue your course of treatment even though you feel ill unless your doctor tells you to stop. Call your doctor or health care professional for advice if you get a fever, chills or sore throat, or other symptoms of a cold or flu. Do not treat yourself. This drug decreases your body's ability to fight infections. Try to avoid being around people who are sick. This medicine may increase your risk to bruise or bleed. Call your doctor or health care professional if you notice any unusual bleeding. Be careful brushing and flossing your teeth or using a toothpick because you may get an infection or bleed more easily. If you have any dental work done, tell your dentist you are receiving this medicine. Avoid taking products that contain aspirin, acetaminophen, ibuprofen, naproxen, or ketoprofen unless instructed by your doctor. These medicines may hide a fever. Do not become pregnant while taking this medicine. Women should inform their doctor if they wish to become pregnant or think they might be pregnant. There is a potential for serious side effects to an unborn child. Talk to your health care professional or pharmacist for more information. Do not breast-feed an infant while taking this medicine. Men are advised not to father a child while receiving this medicine.  This product may contain alcohol. Ask your pharmacist or healthcare provider if this medicine contains alcohol. Be sure to tell all healthcare providers you are taking this medicine. Certain medicines, like metronidazole and disulfiram, can cause an unpleasant reaction when taken with alcohol. The reaction includes flushing, headache, nausea, vomiting, sweating, and increased thirst. The reaction can  last from 30 minutes to several hours. What side effects may I notice from receiving this medicine? Side effects that you should report to your doctor or health care professional as soon as possible: -allergic reactions like skin rash, itching or hives, swelling of the face, lips, or tongue -low blood counts - This drug may decrease the number of white blood cells, red blood cells and platelets. You may be at increased risk for infections and bleeding. -signs of infection - fever or chills, cough, sore throat, pain or difficulty passing urine -signs of decreased platelets or bleeding - bruising, pinpoint red spots on the skin, black, tarry stools, nosebleeds -signs of decreased red blood cells - unusually weak or tired, fainting spells, lightheadedness -breathing problems -chest pain -high or low blood pressure -mouth sores -nausea and vomiting -pain, swelling, redness or irritation at the injection site -pain, tingling, numbness in the hands or feet -slow or irregular heartbeat -swelling of the ankle, feet, hands Side effects that usually do not require medical attention (report to your doctor or health care professional if they continue or are bothersome): -bone pain -complete hair loss including hair on your head, underarms, pubic hair, eyebrows, and eyelashes -changes in the color of fingernails -diarrhea -loosening of the fingernails -loss of appetite -muscle or joint pain -red flush to skin -sweating This list may not describe all possible side effects. Call your doctor for medical advice about side effects. You may report side effects to FDA at 1-800-FDA-1088. Where should I keep my medicine? This drug is given in a hospital or clinic and will not be stored at home. NOTE: This sheet is a summary. It may not cover all possible information. If you have questions about this medicine, talk to your doctor, pharmacist, or health care provider.  2017 Elsevier/Gold Standard (2015-10-17  19:58:00)

## 2016-11-26 NOTE — Telephone Encounter (Signed)
Spouse stopped by for appointments today and was given schedule for December

## 2016-11-26 NOTE — Progress Notes (Signed)
Oncology Nurse Navigator Documentation  Oncology Nurse Navigator Flowsheets 11/26/2016  Navigator Location [facility]  Referral date to RadOnc/MedOnc 10/28/2016  Navigator Encounter Type Treatment  Abnormal Finding Date 10/16/2016  Confirmed Diagnosis Date 10/24/2016  Multidisiplinary Clinic Date 11/01/2016  Treatment Initiated Date 11/25/2016  Patient Visit Type MedOnc  Treatment Phase First Chemo Tx--Taxol/Carboplatin  Barriers/Navigation Needs Education  Education Pain/ Symptom Management  Interventions Education--instructed how to make slurry from Carafate tablet and how to take it. Encouraged her to push high calorie/protein to avoid any more weight loss. MD will re-evaluate on 12/5 and if further weight loss, he will strongly encourage G-tube for feedings.  Coordination of Care -  Education Method Verbal;Teach-back  Support Groups/Services -  Acuity Level 1  Time Spent with Patient 15  Patient is extremely resistant to feeding tube, however she understands she may need one if she loses more weight.

## 2016-11-27 ENCOUNTER — Ambulatory Visit
Admission: RE | Admit: 2016-11-27 | Discharge: 2016-11-27 | Disposition: A | Discharge status: Home / Self Care | Payer: Medicare Other | Source: Ambulatory Visit | Attending: Radiation Oncology | Admitting: Radiation Oncology

## 2016-11-27 DIAGNOSIS — Z510 Encounter for antineoplastic radiation therapy: Secondary | ICD-10-CM | POA: Diagnosis not present

## 2016-11-28 ENCOUNTER — Ambulatory Visit
Admission: RE | Admit: 2016-11-28 | Discharge: 2016-11-28 | Disposition: A | Discharge status: Home / Self Care | Payer: Medicare Other | Source: Ambulatory Visit | Attending: Radiation Oncology | Admitting: Radiation Oncology

## 2016-11-28 DIAGNOSIS — Z510 Encounter for antineoplastic radiation therapy: Secondary | ICD-10-CM | POA: Diagnosis not present

## 2016-11-29 ENCOUNTER — Ambulatory Visit
Admission: RE | Admit: 2016-11-29 | Discharge: 2016-11-29 | Disposition: A | Discharge status: Home / Self Care | Payer: Medicare Other | Source: Ambulatory Visit | Attending: Radiation Oncology | Admitting: Radiation Oncology

## 2016-11-29 DIAGNOSIS — Z510 Encounter for antineoplastic radiation therapy: Secondary | ICD-10-CM | POA: Diagnosis not present

## 2016-12-01 ENCOUNTER — Emergency Department (HOSPITAL_COMMUNITY)
Admission: EM | Admit: 2016-12-01 | Discharge: 2016-12-01 | Disposition: A | Discharge status: Home / Self Care | Payer: Medicare Other | Attending: Emergency Medicine | Admitting: Emergency Medicine

## 2016-12-01 ENCOUNTER — Encounter (HOSPITAL_COMMUNITY): Payer: Self-pay | Admitting: Emergency Medicine

## 2016-12-01 ENCOUNTER — Other Ambulatory Visit: Payer: Self-pay | Admitting: Oncology

## 2016-12-01 ENCOUNTER — Ambulatory Visit: Payer: Medicare Other

## 2016-12-01 DIAGNOSIS — Z8501 Personal history of malignant neoplasm of esophagus: Secondary | ICD-10-CM | POA: Insufficient documentation

## 2016-12-01 DIAGNOSIS — F17210 Nicotine dependence, cigarettes, uncomplicated: Secondary | ICD-10-CM | POA: Diagnosis not present

## 2016-12-01 DIAGNOSIS — J449 Chronic obstructive pulmonary disease, unspecified: Secondary | ICD-10-CM | POA: Diagnosis not present

## 2016-12-01 DIAGNOSIS — K5900 Constipation, unspecified: Secondary | ICD-10-CM | POA: Diagnosis present

## 2016-12-01 DIAGNOSIS — Z79899 Other long term (current) drug therapy: Secondary | ICD-10-CM | POA: Insufficient documentation

## 2016-12-01 DIAGNOSIS — K5641 Fecal impaction: Secondary | ICD-10-CM | POA: Diagnosis not present

## 2016-12-01 DIAGNOSIS — Z853 Personal history of malignant neoplasm of breast: Secondary | ICD-10-CM | POA: Diagnosis not present

## 2016-12-01 LAB — COMPREHENSIVE METABOLIC PANEL
ALT: 22 U/L (ref 14–54)
AST: 26 U/L (ref 15–41)
Albumin: 3.7 g/dL (ref 3.5–5.0)
Alkaline Phosphatase: 74 U/L (ref 38–126)
Anion gap: 9 (ref 5–15)
BILIRUBIN TOTAL: 1.2 mg/dL (ref 0.3–1.2)
BUN: 21 mg/dL — AB (ref 6–20)
CO2: 27 mmol/L (ref 22–32)
CREATININE: 0.54 mg/dL (ref 0.44–1.00)
Calcium: 8.7 mg/dL — ABNORMAL LOW (ref 8.9–10.3)
Chloride: 96 mmol/L — ABNORMAL LOW (ref 101–111)
Glucose, Bld: 98 mg/dL (ref 65–99)
POTASSIUM: 4 mmol/L (ref 3.5–5.1)
Sodium: 132 mmol/L — ABNORMAL LOW (ref 135–145)
TOTAL PROTEIN: 6.5 g/dL (ref 6.5–8.1)

## 2016-12-01 LAB — CBC WITH DIFFERENTIAL/PLATELET
BASOS ABS: 0 10*3/uL (ref 0.0–0.1)
Basophils Relative: 0 %
EOS ABS: 0 10*3/uL (ref 0.0–0.7)
EOS PCT: 0 %
HCT: 39.9 % (ref 36.0–46.0)
Hemoglobin: 13.2 g/dL (ref 12.0–15.0)
LYMPHS PCT: 4 %
Lymphs Abs: 0.4 10*3/uL — ABNORMAL LOW (ref 0.7–4.0)
MCH: 30.7 pg (ref 26.0–34.0)
MCHC: 33.1 g/dL (ref 30.0–36.0)
MCV: 92.8 fL (ref 78.0–100.0)
Monocytes Absolute: 0.2 10*3/uL (ref 0.1–1.0)
Monocytes Relative: 2 %
Neutro Abs: 9 10*3/uL — ABNORMAL HIGH (ref 1.7–7.7)
Neutrophils Relative %: 94 %
Platelets: 179 10*3/uL (ref 150–400)
RBC: 4.3 MIL/uL (ref 3.87–5.11)
RDW: 13 % (ref 11.5–15.5)
WBC: 9.6 10*3/uL (ref 4.0–10.5)

## 2016-12-01 MED ORDER — SODIUM CHLORIDE 0.9 % IV BOLUS (SEPSIS)
1000.0000 mL | Freq: Once | INTRAVENOUS | Status: AC
  Administered 2016-12-01: 1000 mL via INTRAVENOUS

## 2016-12-01 NOTE — ED Notes (Signed)
Pt had medium stool after enema. Pt aware of need for urine sample, unable to go at present

## 2016-12-01 NOTE — ED Triage Notes (Signed)
Pt reports that she has been constipated for the last 5 days with no relief from OTC medications. Pt also reports feeling dehydrated. Pt states she has not drank much in regards to fluids.

## 2016-12-01 NOTE — ED Notes (Signed)
Pt only retained about 75ml of enema before saying she was ready to stop.

## 2016-12-01 NOTE — Discharge Instructions (Signed)
Please continue taking MiraLAX as prescribed to prevent constipation, and try to increase your daily water intake. I also recommend supplementing fiber in your diet with Metamucil. Follow-up with your primary care doctor and oncologist this week for reevaluation of constipation.

## 2016-12-01 NOTE — ED Provider Notes (Signed)
[facility] DEPT Provider Note   CSN: [id number] Arrival date & time: 12/01/16  [id number]     History   Chief Complaint Chief Complaint  Patient presents with  . Constipation    HPI Karen Osborn is a 75 y.o. female.  Patient is a 75 year old female with past medical history of esophageal cancer, starting chemotherapy and radiation treatment about 1 week ago, presenting with chief complaint of constipation for about 5 days and feeling of dehydration. She denies any abdominal pain or vomiting, and has been tolerating small sips of water. She reports less of an appetite due to progressive dysphasia, but has been receiving adequate nutrition taking Boost. She denies taking narcotic pain medicine. She has tried numerous over-the-counter remedies including MiraLAX and a Fleet enema at 4 AM today, but nothing relieved constipation. She has no other complaints. She states she has a follow-up appointment with oncology on Monday.       Past Medical History:  Diagnosis Date  . Breast cancer ([place])    left  . COPD (chronic obstructive pulmonary disease) ([place])   . Dyspnea   . GERD (gastroesophageal reflux disease)     Patient Active Problem List   Diagnosis Date Noted  . Malignant neoplasm of middle third of esophagus ([place]) 10/30/2016  . Difficulty in swallowing 10/24/2016  . COPD  GOLD III still smoking  04/24/2016  . Cigarette smoker 04/24/2016  . Breast cancer ([place]) 02/18/2013  . 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 BREAST LUMPECTOMY Left 1/132006  . ESOPHAGOGASTRODUODENOSCOPY (EGD) WITH PROPOFOL N/A 10/24/2016   Procedure: ESOPHAGOGASTRODUODENOSCOPY (EGD) WITH PROPOFOL;  Surgeon: Wilford Corner, MD;  Location: [facility] ENDOSCOPY;  Service: Endoscopy;  Laterality: N/A;    OB History    No data available       Home Medications    Prior to Admission medications   Medication Sig Start Date End Date Taking? Authorizing Provider    acetaminophen (TYLENOL) 500 MG tablet Take 500 mg by mouth every 6 (six) hours as needed.    Historical Provider, MD  albuterol (PROAIR HFA) 108 (90 Base) MCG/ACT inhaler 2 puffs every 4 hours as needed only  if your can't catch your breath 09/26/16   Tanda Rockers, MD  budesonide-formoterol [facility]) 160-4.5 MCG/ACT inhaler Inhale 2 puffs into the lungs daily. Tries  4-5x week    Historical Provider, MD  calcium carbonate (TUMS - DOSED IN MG ELEMENTAL CALCIUM) 500 MG chewable tablet Chew 1 tablet by mouth 2 (two) times daily.    Historical Provider, MD  dexamethasone (DECADRON) 2 MG tablet Take 5 tablets (10 mg total) by mouth as directed. Take at 10 pm night before 1st chemo and 6 am day of 1st chemo10 11/19/16   Owens Shark, NP  prochlorperazine (COMPAZINE) 5 MG tablet Take 1 tablet (5 mg total) by mouth every 6 (six) hours as needed for nausea or vomiting. 11/01/16   Ladell Pier, MD  SPIRIVA RESPIMAT 2.5 MCG/ACT AERS INL 2 PFS INTO THE LUNGS D 08/30/16   Historical Provider, MD  sucralfate (CARAFATE) 1 g tablet Take 1 tablet (1 g total) by mouth 4 (four) times daily. 11/25/16   Kyung Rudd, MD  vitamin B-12 (CYANOCOBALAMIN) 1000 MCG tablet Take 1,000 mcg by mouth daily. Takes 4x week    Historical Provider, MD  Vitamin D, Ergocalciferol, (DRISDOL) 50000 units CAPS capsule Take 50,000 Units by mouth every 7 (seven) days.    Historical Provider, MD  Wound Dressings (SONAFINE) Apply 1 application topically 2 (two) times daily.    Historical Provider, MD    Family History Family History  Problem Relation Age of Onset  . Heart attack Mother   . Heart attack Father   . Arrhythmia Sister   . Cancer Sister     Breast Cancer  . COPD Brother   . Cancer Sister     Skin Cancer, Breast Cancer    Social History Social History  Substance Use Topics  . Smoking status: Current Every Day Smoker    Packs/day: 0.50    Years: 54.00    Types: Cigarettes  . Smokeless tobacco: Never Used  .  Alcohol use No     Allergies   Codeine   Review of Systems Review of Systems  Constitutional: Negative for chills and fever.  HENT: Negative for ear pain and sore throat.   Eyes: Negative for pain and visual disturbance.  Respiratory: Negative for cough and shortness of breath.   Cardiovascular: Negative for chest pain, palpitations and leg swelling.  Gastrointestinal: Positive for constipation. Negative for abdominal pain, blood in stool, nausea and vomiting.  Genitourinary: Negative for dysuria, flank pain and hematuria.  Musculoskeletal: Negative for back pain and neck pain.  Skin: Negative for color change and rash.  Neurological: Negative for dizziness, seizures, syncope, weakness, numbness and headaches.     Physical Exam Updated Vital Signs BP 111/70   Pulse 88   Temp 97.9 F (36.6 C) (Oral)   Resp 18   Ht 5\' 1"  (1.549 m)   Wt 30.8 kg   SpO2 95%   BMI 12.85 kg/m   Physical Exam  Constitutional:  Thin elderly female, lying comfortably in bed, no acute distress.  HENT:  Head: Normocephalic and atraumatic.  Oropharynx is clear but mucous membranes appear dry.  Eyes: Conjunctivae are normal.  Neck: Normal range of motion.  Cardiovascular: Normal rate, regular rhythm, normal heart sounds and intact distal pulses.   Pulmonary/Chest: Effort normal and breath sounds normal. No respiratory distress.  Abdominal: Soft. Bowel sounds are normal. She exhibits no distension. There is no tenderness. There is no guarding.  Genitourinary:  Genitourinary Comments: Female chaperone present. Moderate amount of firm stool in rectal vault. Normal tone, no tenderness, mass, or fissure. No hemorrhoids.  Musculoskeletal: Normal range of motion. She exhibits no edema or tenderness.  Neurological: She is alert.  Skin: Skin is warm and dry.  Psychiatric: She has a normal mood and affect.  Nursing note and vitals reviewed.    ED Treatments / Results  Labs (all labs ordered are  listed, but only abnormal results are displayed) Labs Reviewed  COMPREHENSIVE METABOLIC PANEL - Abnormal; Notable for the following:       Result Value   Sodium 132 (*)    Chloride 96 (*)    BUN 21 (*)    Calcium 8.7 (*)    All other components within normal limits  CBC WITH DIFFERENTIAL/PLATELET - Abnormal; Notable for the following:    Neutro Abs 9.0 (*)    Lymphs Abs 0.4 (*)    All other components within normal limits    EKG  EKG Interpretation None       Radiology No results found.  Procedures Procedures (including critical care time)  Medications Ordered in ED Medications  sodium chloride 0.9 % bolus 1,000 mL (0 mLs Intravenous Stopped 12/01/16 0805)     Initial Impression / Assessment and Plan / ED Course  I have  reviewed the triage vital signs and the nursing notes.  Pertinent labs & imaging results that were available during my care of the patient were reviewed by me and considered in my medical decision making (see chart for details).  Clinical Course    Patient is a 75 year old female presenting with chief complaint of constipation. Vital signs stable with unremarkable abdominal exam. Patient denies abdominal pain or vomiting, and doubt small bowel obstruction or other acute abdominal pathology. Imaging deferred per discussion with attending Dr. Jola Schmidt. Moderate amount of firm stool noted on DRE. Attempt was made to manually disimpact, with some relief. IVF and soap sud enema ordered. On reassessment, patient states that she had a BM and felt much better. Very appreciative of care. Stable for d/c home with instructions to take MiraLAX as directed and to increase her dietary fiber and fluid intake as tolerated. Advised to follow up with PCP and oncology this week, or return to ED for any new or worsening symptoms.  Final Clinical Impressions(s) / ED Diagnoses   Final diagnoses:  Constipation, unspecified constipation type  Fecal impaction in rectum [facility])      New Prescriptions New Prescriptions   No medications on file     Rosilyn Mings II, [place] 12/01/16 [place], MD 12/02/16 [phone]

## 2016-12-02 ENCOUNTER — Encounter: Payer: Self-pay | Admitting: Radiation Oncology

## 2016-12-02 ENCOUNTER — Ambulatory Visit
Admission: RE | Admit: 2016-12-02 | Discharge: 2016-12-02 | Disposition: A | Discharge status: Home / Self Care | Payer: Medicare Other | Source: Ambulatory Visit | Attending: Radiation Oncology | Admitting: Radiation Oncology

## 2016-12-02 ENCOUNTER — Telehealth: Payer: Self-pay | Admitting: *Deleted

## 2016-12-02 ENCOUNTER — Encounter: Payer: Self-pay | Admitting: Pharmacist

## 2016-12-02 VITALS — BP 127/75 | HR 82 | Temp 97.9°F | Resp 20 | Wt 71.2 lb

## 2016-12-02 DIAGNOSIS — Z510 Encounter for antineoplastic radiation therapy: Secondary | ICD-10-CM | POA: Diagnosis not present

## 2016-12-02 DIAGNOSIS — C154 Malignant neoplasm of middle third of esophagus: Secondary | ICD-10-CM

## 2016-12-02 NOTE — Progress Notes (Signed)
Department of Radiation Oncology  Phone:  [phone] Fax:        [phone]  Weekly Treatment Note    Name: KENTLEIGH QUIRINDONGO Date: 12/02/2016 MRN: [id number] DOB: October 22, 1941   Diagnosis:     ICD-9-CM ICD-10-CM   1. Malignant neoplasm of middle third of esophagus ([place]) 150.4 C15.4      Current dose: 10.8 Gy  Current fraction: 6   MEDICATIONS: Current Outpatient Prescriptions  Medication Sig Dispense Refill  . acetaminophen (TYLENOL) 500 MG tablet Take 500 mg by mouth every 6 (six) hours as needed.    Marland Kitchen albuterol (PROAIR HFA) 108 (90 Base) MCG/ACT inhaler 2 puffs every 4 hours as needed only  if your can't catch your breath 1 Inhaler 11  . dexamethasone (DECADRON) 2 MG tablet Take 5 tablets (10 mg total) by mouth as directed. Take at 10 pm night before 1st chemo and 6 am day of 1st chemo10 10 tablet 0  . magnesium hydroxide (PHILLIPS MILK OF MAGNESIA) 400 MG/5ML suspension Take 30 mLs by mouth daily as needed for mild constipation.    . mineral oil (FLEET MINERAL OIL) enema Place 1 enema rectally once.    . polyethylene glycol (MIRALAX / GLYCOLAX) packet Take 17 g by mouth daily as needed for moderate constipation.    . prochlorperazine (COMPAZINE) 5 MG tablet Take 1 tablet (5 mg total) by mouth every 6 (six) hours as needed for nausea or vomiting. 60 tablet 1  . SPIRIVA RESPIMAT 2.5 MCG/ACT AERS INL 2 PFS INTO THE LUNGS D  5  . sucralfate (CARAFATE) 1 g tablet Take 1 tablet (1 g total) by mouth 4 (four) times daily. 120 tablet 2   No current facility-administered medications for this encounter.      ALLERGIES: Codeine   LABORATORY DATA:  Lab Results  Component Value Date   WBC 9.6 12/01/2016   HGB 13.2 12/01/2016   HCT 39.9 12/01/2016   MCV 92.8 12/01/2016   PLT 179 12/01/2016   Lab Results  Component Value Date   NA 132 (L) 12/01/2016   K 4.0 12/01/2016   CL 96 (L) 12/01/2016   CO2 27 12/01/2016   Lab Results  Component Value Date   ALT 22  12/01/2016   AST 26 12/01/2016   ALKPHOS 74 12/01/2016   BILITOT 1.2 12/01/2016     NARRATIVE: Karen Osborn was seen today for weekly treatment management. The chart was checked and the patient's films were reviewed.  Patient complains of a poor appetite. She notes emesis this morning. She took MOM and Miralax yesterday prior to going to the ED. Patient presented to the ED impacted and was given IVF's and soapsuds enema. She had a bowel movement and was discharged. She reports taking liquid carafate but reports throwing it up. Patient also reports chronic dry mouth.  PHYSICAL EXAMINATION: weight is 71 lb 3.2 oz (32.3 kg). Her oral temperature is 97.9 F (36.6 C). Her blood pressure is 127/75 and her pulse is 82. Her respiration is 20.   ASSESSMENT: The patient is doing satisfactorily with treatment.  PLAN: We will continue with the patient's radiation treatment as planned. Recommended for the patient to take a stool softener daily. Recommended the patient try sucking on candy to stimulate the salivary glands.  ------------------------------------------------  Jodelle Gross, MD, PhD  This document serves as a record of services personally performed by Kyung Rudd, MD. It was created on his behalf by Bethann Humble, a trained medical scribe.  The creation of this record is based on the scribe's personal observations and the provider's statements to them. This document has been checked and approved by the attending provider.

## 2016-12-02 NOTE — Progress Notes (Addendum)
Weekly rad txs ,  Esophagus, c/o poor appeite, still threw up grits this am, went to ED yesterday,impacted, gave patient IVF'S and soapsuds enema, which she had a bowel movement and sent home, taking the liquid carafate but she stated she threw it up also, wants rx for constipation, she took MOM before going to the Ed and mira alx Very weak and feeling puny stated today 10:49 AM BP 127/75 (BP Location: Left Arm, Patient Position: Sitting, Cuff Size: Small)   Pulse 82   Temp 97.9 F (36.6 C) (Oral)   Resp 20   Wt 71 lb 3.2 oz (32.3 kg)   BMI 13.45 kg/m   Wt Readings from Last 3 Encounters:  12/02/16 71 lb 3.2 oz (32.3 kg)  12/01/16 68 lb (30.8 kg)  11/25/16 68 lb (30.8 kg)

## 2016-12-02 NOTE — Telephone Encounter (Signed)
Call placed to f/u with patient after ER visit on 12/01/16.  Patient states that she is feeling better and has had a BM today. Patient's medication list reviewed with pt.  Patient appreciative of call and has no questions or concerns at this time.

## 2016-12-03 ENCOUNTER — Other Ambulatory Visit (HOSPITAL_BASED_OUTPATIENT_CLINIC_OR_DEPARTMENT_OTHER): Payer: Medicare Other

## 2016-12-03 ENCOUNTER — Ambulatory Visit (HOSPITAL_BASED_OUTPATIENT_CLINIC_OR_DEPARTMENT_OTHER): Payer: Medicare Other

## 2016-12-03 ENCOUNTER — Other Ambulatory Visit: Payer: Self-pay | Admitting: *Deleted

## 2016-12-03 ENCOUNTER — Telehealth: Payer: Self-pay | Admitting: Nurse Practitioner

## 2016-12-03 ENCOUNTER — Ambulatory Visit: Payer: Medicare Other | Admitting: Nutrition

## 2016-12-03 ENCOUNTER — Ambulatory Visit
Admission: RE | Admit: 2016-12-03 | Discharge: 2016-12-03 | Disposition: A | Discharge status: Home / Self Care | Payer: Medicare Other | Source: Ambulatory Visit | Attending: Radiation Oncology | Admitting: Radiation Oncology

## 2016-12-03 ENCOUNTER — Ambulatory Visit (HOSPITAL_BASED_OUTPATIENT_CLINIC_OR_DEPARTMENT_OTHER): Payer: Medicare Other | Admitting: Nurse Practitioner

## 2016-12-03 VITALS — BP 133/78 | HR 90 | Temp 97.5°F | Resp 18 | Ht 61.0 in | Wt 70.9 lb

## 2016-12-03 DIAGNOSIS — C154 Malignant neoplasm of middle third of esophagus: Secondary | ICD-10-CM | POA: Diagnosis present

## 2016-12-03 DIAGNOSIS — Z510 Encounter for antineoplastic radiation therapy: Secondary | ICD-10-CM | POA: Diagnosis not present

## 2016-12-03 DIAGNOSIS — Z5111 Encounter for antineoplastic chemotherapy: Secondary | ICD-10-CM | POA: Diagnosis present

## 2016-12-03 LAB — CBC WITH DIFFERENTIAL/PLATELET
BASO%: 0.4 % (ref 0.0–2.0)
Basophils Absolute: 0 10*3/uL (ref 0.0–0.1)
EOS%: 1.8 % (ref 0.0–7.0)
Eosinophils Absolute: 0.1 10*3/uL (ref 0.0–0.5)
HEMATOCRIT: 38 % (ref 34.8–46.6)
HEMOGLOBIN: 13.2 g/dL (ref 11.6–15.9)
LYMPH#: 0.6 10*3/uL — AB (ref 0.9–3.3)
LYMPH%: 11.1 % — AB (ref 14.0–49.7)
MCH: 31.2 pg (ref 25.1–34.0)
MCHC: 34.7 g/dL (ref 31.5–36.0)
MCV: 89.8 fL (ref 79.5–101.0)
MONO#: 0.6 10*3/uL (ref 0.1–0.9)
MONO%: 11.3 % (ref 0.0–14.0)
NEUT%: 75.4 % (ref 38.4–76.8)
NEUTROS ABS: 4.2 10*3/uL (ref 1.5–6.5)
Platelets: 149 10*3/uL (ref 145–400)
RBC: 4.23 10*6/uL (ref 3.70–5.45)
RDW: 12.7 % (ref 11.2–14.5)
WBC: 5.6 10*3/uL (ref 3.9–10.3)
nRBC: 0 % (ref 0–0)

## 2016-12-03 MED ORDER — SODIUM CHLORIDE 0.9 % IV SOLN
Freq: Once | INTRAVENOUS | Status: AC
  Administered 2016-12-03: 12:00:00 via INTRAVENOUS

## 2016-12-03 MED ORDER — DIPHENHYDRAMINE HCL 50 MG/ML IJ SOLN: 25.0000 mg | Freq: Once | INTRAMUSCULAR | Status: DC

## 2016-12-03 MED ORDER — DEXAMETHASONE SODIUM PHOSPHATE 10 MG/ML IJ SOLN
10.0000 mg | Freq: Once | INTRAMUSCULAR | Status: AC
  Administered 2016-12-03: 10 mg via INTRAVENOUS

## 2016-12-03 MED ORDER — PALONOSETRON HCL INJECTION 0.25 MG/5ML
INTRAVENOUS | Status: AC
  Filled 2016-12-03: qty 5

## 2016-12-03 MED ORDER — SODIUM CHLORIDE 0.9 % IV SOLN
101.0000 mg | Freq: Once | INTRAVENOUS | Status: AC
  Administered 2016-12-03: 100 mg via INTRAVENOUS
  Filled 2016-12-03: qty 10

## 2016-12-03 MED ORDER — PALONOSETRON HCL INJECTION 0.25 MG/5ML
0.2500 mg | Freq: Once | INTRAVENOUS | Status: AC
  Administered 2016-12-03: 0.25 mg via INTRAVENOUS

## 2016-12-03 MED ORDER — FAMOTIDINE IN NACL 20-0.9 MG/50ML-% IV SOLN
20.0000 mg | Freq: Once | INTRAVENOUS | Status: AC
  Administered 2016-12-03: 20 mg via INTRAVENOUS

## 2016-12-03 MED ORDER — DEXAMETHASONE SODIUM PHOSPHATE 10 MG/ML IJ SOLN
INTRAMUSCULAR | Status: AC
  Filled 2016-12-03: qty 1

## 2016-12-03 MED ORDER — FAMOTIDINE IN NACL 20-0.9 MG/50ML-% IV SOLN
INTRAVENOUS | Status: AC
  Filled 2016-12-03: qty 50

## 2016-12-03 MED ORDER — SODIUM CHLORIDE 0.9 % IV SOLN
50.0000 mg/m2 | Freq: Once | INTRAVENOUS | Status: AC
  Administered 2016-12-03: 60 mg via INTRAVENOUS
  Filled 2016-12-03: qty 10

## 2016-12-03 MED ORDER — DIPHENHYDRAMINE HCL 50 MG/ML IJ SOLN
INTRAMUSCULAR | Status: AC
  Filled 2016-12-03: qty 1

## 2016-12-03 NOTE — Progress Notes (Signed)
Nutrition follow-up completed with patient being treated for esophageal cancer. Weight improved documented as 71.2 pounds, December 4 increased from 68 pounds November 27. Patient continues to consume 3 bottles of boost plus a day. She was told she needs to increase her fluid intake. She still tolerates pudding.  Nutrition diagnosis: Malnutrition continues.  Intervention: Encouraged patient to increase boost plus to 4 bottles a day. Encouraged her to continue pudding as tolerated. Reviewed ways to increase fluid intake using foods/ beverages with calories and protein. Patient is agreeable to adding yogurt once a day. Patient still refuses feeding tube.  Monitoring, evaluation, goals: Patient will work to increase calories and protein to minimize weight loss.  Next visit: Tuesday, December 12, during infusion.  **Disclaimer: This note was dictated with voice recognition software. Similar sounding words can inadvertently be transcribed and this note may contain transcription errors which may not have been corrected upon publication of note.**

## 2016-12-03 NOTE — Progress Notes (Signed)
  [facility] OFFICE PROGRESS NOTE   Diagnosis: Esophagus cancer    INTERVAL HISTORY:   Ms. Hachey returns as scheduled. She continues radiation. She completed she completed cycle 1 weekly Taxol/carboplatin 11/26/2016. She had mild nausea. No mouth sores. No diarrhea. She was seen in the emergency room for constipation 12/01/2016. She is now taking a stool softener. She is tolerating liquids with small sips, some soft solids. She notes difficulty with water. She has mild intermittent chest pain.  Objective:  Vital signs in last 24 hours:  Blood pressure 133/78, pulse 90, temperature 97.5 F (36.4 C), temperature source Oral, resp. rate 18, height 5\' 1"  (1.549 m), weight 70 lb 14.4 oz (32.2 kg), SpO2 97 %.    HEENT: No thrush or ulcers. Resp: Lungs clear bilaterally. Cardio: Regular rate and rhythm. GI: Abdomen soft and nontender. No hepatomegaly. Vascular: No leg edema.    Lab Results:  Lab Results  Component Value Date   WBC 5.6 12/03/2016   HGB 13.2 12/03/2016   HCT 38.0 12/03/2016   MCV 89.8 12/03/2016   PLT 149 12/03/2016   NEUTROABS 4.2 12/03/2016    Imaging:  No results found.  Medications: I have reviewed the patient's current medications.  Assessment/Plan: 1. Squamous cell carcinoma the midesophagus ? Staging CT scans 10/29/2016 with no evidence of metastatic disease; nonspecific liver lesions, right hydronephrosis, retroperitoneal lymph nodes, and sclerotic right seventh rib lesion ? Staging PET scan 11/13/2016-low left jugular/supraclavicular node measuring 5 mm and SUV max of 2.6; left paratracheal node measuring 8 mm and SUV max 5.8; periaortic nodes including a 6 mm node SUV max 4.4; right retrocrural hypermetabolic node measures 8 mm and SUV max of 9.4; hypermetabolism corresponding to the mid esophageal primary SUV max 15.6; no right hepatic lobe hypermetabolism; no hypermetabolism to correspond to the retroperitoneal mild adenopathy on  prior exam. ? Initiation of radiation 11/25/2016 and concurrent weekly Taxol/carboplatin 11/26/2016  2. COPD  3. 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 Right hydronephrosis on the CT 10/29/2016  5. Dysphagia/weight loss secondary to #1   Disposition:Ms. Lanni appears stable. She continues radiation. Plan to proceed with cycle 2 weekly Taxol/carboplatin today as scheduled. She will return for a follow-up visit in one week.     Ned Card ANP/GNP-BC   12/03/2016  11:15 AM

## 2016-12-03 NOTE — Patient Instructions (Signed)
[facility] Discharge Instructions for Patients Receiving Chemotherapy  Today you received the following chemotherapy agents:  Carboplatin, Taxol  To help prevent nausea and vomiting after your treatment, we encourage you to take your nausea medication as prescribed.   If you develop nausea and vomiting that is not controlled by your nausea medication, call the clinic.   BELOW ARE SYMPTOMS THAT SHOULD BE REPORTED IMMEDIATELY:  *FEVER GREATER THAN 100.5 F  *CHILLS WITH OR WITHOUT FEVER  NAUSEA AND VOMITING THAT IS NOT CONTROLLED WITH YOUR NAUSEA MEDICATION  *UNUSUAL SHORTNESS OF BREATH  *UNUSUAL BRUISING OR BLEEDING  TENDERNESS IN MOUTH AND THROAT WITH OR WITHOUT PRESENCE OF ULCERS  *URINARY PROBLEMS  *BOWEL PROBLEMS  UNUSUAL RASH Items with * indicate a potential emergency and should be followed up as soon as possible.  Feel free to call the clinic you have any questions or concerns. The clinic phone number is [phone].  Please show the CHEMO ALERT CARD at check-in to the Emergency Department and triage nurse.   

## 2016-12-03 NOTE — Telephone Encounter (Signed)
Appointments scheduled per 1/25 LOS. Patient given AVS report and calendars with future scheduled appointments. °

## 2016-12-04 ENCOUNTER — Encounter: Payer: Self-pay | Admitting: Radiation Oncology

## 2016-12-04 ENCOUNTER — Ambulatory Visit
Admission: RE | Admit: 2016-12-04 | Discharge: 2016-12-04 | Disposition: A | Discharge status: Home / Self Care | Payer: Medicare Other | Source: Ambulatory Visit | Attending: Radiation Oncology | Admitting: Radiation Oncology

## 2016-12-04 DIAGNOSIS — Z510 Encounter for antineoplastic radiation therapy: Secondary | ICD-10-CM | POA: Diagnosis not present

## 2016-12-05 ENCOUNTER — Ambulatory Visit
Admission: RE | Admit: 2016-12-05 | Discharge: 2016-12-05 | Disposition: A | Discharge status: Home / Self Care | Payer: Medicare Other | Source: Ambulatory Visit | Attending: Radiation Oncology | Admitting: Radiation Oncology

## 2016-12-05 ENCOUNTER — Encounter: Payer: Medicare Other | Admitting: Genetic Counselor

## 2016-12-05 DIAGNOSIS — Z510 Encounter for antineoplastic radiation therapy: Secondary | ICD-10-CM | POA: Diagnosis not present

## 2016-12-06 ENCOUNTER — Ambulatory Visit: Payer: Medicare Other | Attending: Radiation Oncology | Admitting: Radiation Oncology

## 2016-12-06 ENCOUNTER — Ambulatory Visit
Admission: RE | Admit: 2016-12-06 | Discharge: 2016-12-06 | Disposition: A | Discharge status: Home / Self Care | Payer: Medicare Other | Source: Ambulatory Visit | Attending: Radiation Oncology | Admitting: Radiation Oncology

## 2016-12-06 DIAGNOSIS — Z510 Encounter for antineoplastic radiation therapy: Secondary | ICD-10-CM | POA: Diagnosis not present

## 2016-12-07 ENCOUNTER — Ambulatory Visit: Payer: Medicare Other

## 2016-12-08 ENCOUNTER — Ambulatory Visit: Payer: Medicare Other

## 2016-12-08 ENCOUNTER — Other Ambulatory Visit: Payer: Self-pay | Admitting: Oncology

## 2016-12-09 ENCOUNTER — Other Ambulatory Visit: Payer: Self-pay | Admitting: *Deleted

## 2016-12-09 ENCOUNTER — Ambulatory Visit
Admission: RE | Admit: 2016-12-09 | Discharge: 2016-12-09 | Disposition: A | Discharge status: Home / Self Care | Payer: Medicare Other | Source: Ambulatory Visit | Attending: Radiation Oncology | Admitting: Radiation Oncology

## 2016-12-09 DIAGNOSIS — Z510 Encounter for antineoplastic radiation therapy: Secondary | ICD-10-CM | POA: Diagnosis not present

## 2016-12-09 DIAGNOSIS — C154 Malignant neoplasm of middle third of esophagus: Secondary | ICD-10-CM

## 2016-12-10 ENCOUNTER — Ambulatory Visit: Payer: Medicare Other | Admitting: Nutrition

## 2016-12-10 ENCOUNTER — Other Ambulatory Visit (HOSPITAL_BASED_OUTPATIENT_CLINIC_OR_DEPARTMENT_OTHER): Payer: Medicare Other

## 2016-12-10 ENCOUNTER — Ambulatory Visit (HOSPITAL_BASED_OUTPATIENT_CLINIC_OR_DEPARTMENT_OTHER): Payer: Medicare Other | Admitting: Nurse Practitioner

## 2016-12-10 ENCOUNTER — Telehealth: Payer: Self-pay | Admitting: Oncology

## 2016-12-10 ENCOUNTER — Ambulatory Visit (HOSPITAL_BASED_OUTPATIENT_CLINIC_OR_DEPARTMENT_OTHER): Payer: Medicare Other

## 2016-12-10 ENCOUNTER — Ambulatory Visit: Payer: Medicare Other | Admitting: Nurse Practitioner

## 2016-12-10 ENCOUNTER — Ambulatory Visit
Admission: RE | Admit: 2016-12-10 | Discharge: 2016-12-10 | Disposition: A | Discharge status: Home / Self Care | Payer: Medicare Other | Source: Ambulatory Visit | Attending: Radiation Oncology | Admitting: Radiation Oncology

## 2016-12-10 ENCOUNTER — Other Ambulatory Visit: Payer: Medicare Other

## 2016-12-10 VITALS — BP 130/77 | HR 86 | Resp 17 | Ht 61.0 in | Wt 70.8 lb

## 2016-12-10 DIAGNOSIS — Z5111 Encounter for antineoplastic chemotherapy: Secondary | ICD-10-CM

## 2016-12-10 DIAGNOSIS — Z510 Encounter for antineoplastic radiation therapy: Secondary | ICD-10-CM | POA: Diagnosis not present

## 2016-12-10 DIAGNOSIS — C154 Malignant neoplasm of middle third of esophagus: Secondary | ICD-10-CM

## 2016-12-10 DIAGNOSIS — R1310 Dysphagia, unspecified: Secondary | ICD-10-CM

## 2016-12-10 DIAGNOSIS — J449 Chronic obstructive pulmonary disease, unspecified: Secondary | ICD-10-CM | POA: Diagnosis not present

## 2016-12-10 DIAGNOSIS — R21 Rash and other nonspecific skin eruption: Secondary | ICD-10-CM | POA: Diagnosis not present

## 2016-12-10 LAB — COMPREHENSIVE METABOLIC PANEL
ALBUMIN: 3.3 g/dL — AB (ref 3.5–5.0)
ALK PHOS: 95 U/L (ref 40–150)
ALT: 12 U/L (ref 0–55)
AST: 13 U/L (ref 5–34)
Anion Gap: 9 mEq/L (ref 3–11)
BILIRUBIN TOTAL: 0.41 mg/dL (ref 0.20–1.20)
BUN: 11.5 mg/dL (ref 7.0–26.0)
CO2: 27 meq/L (ref 22–29)
CREATININE: 0.6 mg/dL (ref 0.6–1.1)
Calcium: 9.4 mg/dL (ref 8.4–10.4)
Chloride: 98 mEq/L (ref 98–109)
GLUCOSE: 86 mg/dL (ref 70–140)
Potassium: 3.8 mEq/L (ref 3.5–5.1)
SODIUM: 134 meq/L — AB (ref 136–145)
TOTAL PROTEIN: 6.8 g/dL (ref 6.4–8.3)

## 2016-12-10 LAB — CBC WITH DIFFERENTIAL/PLATELET
BASO%: 0.9 % (ref 0.0–2.0)
Basophils Absolute: 0 10*3/uL (ref 0.0–0.1)
EOS ABS: 0.1 10*3/uL (ref 0.0–0.5)
EOS%: 2.6 % (ref 0.0–7.0)
HCT: 40.2 % (ref 34.8–46.6)
HEMOGLOBIN: 13.2 g/dL (ref 11.6–15.9)
LYMPH%: 10.9 % — AB (ref 14.0–49.7)
MCH: 30.6 pg (ref 25.1–34.0)
MCHC: 32.9 g/dL (ref 31.5–36.0)
MCV: 93.1 fL (ref 79.5–101.0)
MONO#: 0.5 10*3/uL (ref 0.1–0.9)
MONO%: 17.3 % — AB (ref 0.0–14.0)
NEUT%: 68.3 % (ref 38.4–76.8)
NEUTROS ABS: 1.8 10*3/uL (ref 1.5–6.5)
Platelets: 232 10*3/uL (ref 145–400)
RBC: 4.31 10*6/uL (ref 3.70–5.45)
RDW: 13.1 % (ref 11.2–14.5)
WBC: 2.7 10*3/uL — AB (ref 3.9–10.3)
lymph#: 0.3 10*3/uL — ABNORMAL LOW (ref 0.9–3.3)

## 2016-12-10 MED ORDER — PALONOSETRON HCL INJECTION 0.25 MG/5ML
INTRAVENOUS | Status: AC
  Filled 2016-12-10: qty 5

## 2016-12-10 MED ORDER — DEXAMETHASONE SODIUM PHOSPHATE 10 MG/ML IJ SOLN
INTRAMUSCULAR | Status: AC
  Filled 2016-12-10: qty 1

## 2016-12-10 MED ORDER — SODIUM CHLORIDE 0.9 % IV SOLN
101.0000 mg | Freq: Once | INTRAVENOUS | Status: AC
  Administered 2016-12-10: 100 mg via INTRAVENOUS
  Filled 2016-12-10: qty 10

## 2016-12-10 MED ORDER — SODIUM CHLORIDE 0.9 % IV SOLN
Freq: Once | INTRAVENOUS | Status: AC
  Administered 2016-12-10: 11:00:00 via INTRAVENOUS

## 2016-12-10 MED ORDER — FAMOTIDINE IN NACL 20-0.9 MG/50ML-% IV SOLN
INTRAVENOUS | Status: AC
  Filled 2016-12-10: qty 50

## 2016-12-10 MED ORDER — PALONOSETRON HCL INJECTION 0.25 MG/5ML
0.2500 mg | Freq: Once | INTRAVENOUS | Status: AC
  Administered 2016-12-10: 0.25 mg via INTRAVENOUS

## 2016-12-10 MED ORDER — DEXAMETHASONE SODIUM PHOSPHATE 10 MG/ML IJ SOLN
10.0000 mg | Freq: Once | INTRAMUSCULAR | Status: AC
Start: 2016-12-10 — End: 2016-12-10
  Administered 2016-12-10: 10 mg via INTRAVENOUS

## 2016-12-10 MED ORDER — SODIUM CHLORIDE 0.9 % IV SOLN
50.0000 mg/m2 | Freq: Once | INTRAVENOUS | Status: AC
  Administered 2016-12-10: 60 mg via INTRAVENOUS
  Filled 2016-12-10: qty 10

## 2016-12-10 MED ORDER — FAMOTIDINE IN NACL 20-0.9 MG/50ML-% IV SOLN
20.0000 mg | Freq: Once | INTRAVENOUS | Status: AC
  Administered 2016-12-10: 20 mg via INTRAVENOUS

## 2016-12-10 NOTE — Patient Instructions (Signed)
[facility] Discharge Instructions for Patients Receiving Chemotherapy  Today you received the following chemotherapy agents Taxol/Carboplatin To help prevent nausea and vomiting after your treatment, we encourage you to take your nausea medication as prescribed.   If you develop nausea and vomiting that is not controlled by your nausea medication, call the clinic.   BELOW ARE SYMPTOMS THAT SHOULD BE REPORTED IMMEDIATELY:  *FEVER GREATER THAN 100.5 F  *CHILLS WITH OR WITHOUT FEVER  NAUSEA AND VOMITING THAT IS NOT CONTROLLED WITH YOUR NAUSEA MEDICATION  *UNUSUAL SHORTNESS OF BREATH  *UNUSUAL BRUISING OR BLEEDING  TENDERNESS IN MOUTH AND THROAT WITH OR WITHOUT PRESENCE OF ULCERS  *URINARY PROBLEMS  *BOWEL PROBLEMS  UNUSUAL RASH Items with * indicate a potential emergency and should be followed up as soon as possible.  Feel free to call the clinic you have any questions or concerns. The clinic phone number is [phone].  Please show the CHEMO ALERT CARD at check-in to the Emergency Department and triage nurse.   

## 2016-12-10 NOTE — Telephone Encounter (Signed)
Follow up appointments scheduled per 12/10/16 los. A copy of the AVS report and appointment schedule was given to the patient, per 12/10/16 los. ° °

## 2016-12-10 NOTE — Progress Notes (Signed)
Brief nutrition follow-up completed with patient being treated for esophageal cancer. Weight stable and documented as 70.8 pounds December 12 decreased slightly from 71.2 pounds, December 4. Patient reports dysphasia has improved slightly and she is able to eat more than before. She continues to drink vanilla boost plus and reports she has plenty at home. She requests coupons. She continues to tolerate pudding and yogurt.  Nutrition diagnosis: Malnutrition continues.  Intervention: Recommended patient continue boost +4 times a day along with other soft foods as tolerated. Provided patient with coupons. Provided additional suggestions to patient for soft food options. Questions were answered and teach back method was used.  Monitoring, evaluation, goals: Tuesday, December 19, during infusion.  **Disclaimer: This note was dictated with voice recognition software. Similar sounding words can inadvertently be transcribed and this note may contain transcription errors which may not have been corrected upon publication of note.**

## 2016-12-10 NOTE — Progress Notes (Addendum)
  [facility] OFFICE PROGRESS NOTE   Diagnosis: Esophagus cancer   INTERVAL HISTORY:   Ms. Bowlen returns as scheduled. She continues radiation. She completed cycle 2 weekly Taxol/carboplatin 12/03/2016. She continues to have mild intermittent nausea. No mouth sores. No constipation. She notes improvement in the dysphagia. No significant odynophagia. She noted fatigue and "achiness" following the last chemotherapy. Over the weekend she noted a rash at the upper back.  Objective:  Vital signs in last 24 hours:  Blood pressure 130/77, pulse 86, resp. rate 17, height 5\' 1"  (1.549 m), weight 70 lb 12.8 oz (32.1 kg), SpO2 99 %.    HEENT: No thrush or ulcers. Resp: Lungs clear bilaterally. Cardio: Regular rate and rhythm. GI: No hepatomegaly. Vascular: No leg edema. Skin: Mid upper back with mild erythema and small scattered skin lesions. The lesions do not appear vesicular.   Lab Results:  Lab Results  Component Value Date   WBC 5.6 12/03/2016   HGB 13.2 12/03/2016   HCT 38.0 12/03/2016   MCV 89.8 12/03/2016   PLT 149 12/03/2016   NEUTROABS 4.2 12/03/2016    Imaging:  No results found.  Medications: I have reviewed the patient's current medications.  Assessment/Plan: 1. Squamous cell carcinoma the midesophagus ? Staging CT scans 10/29/2016 with no evidence of metastatic disease; nonspecific liver lesions, right hydronephrosis, retroperitoneal lymph nodes, and sclerotic right seventh rib lesion ? Staging PET scan 11/13/2016-low left jugular/supraclavicular node measuring 5 mm and SUV max of 2.6; left paratracheal node measuring8 mm and SUV max 5.8; periaorticnodes including a 6 mm node SUV max 4.4; right retrocrural hypermetabolic node measures 8 mm and SUV max of 9.4;hypermetabolism corresponding to the mid esophageal primary SUV max 15.6; no right hepatic lobe hypermetabolism; no hypermetabolism to correspond to the retroperitoneal mild adenopathy on prior  exam. ? Initiation of radiation 11/25/2016 and concurrent weekly Taxol/carboplatin 11/26/2016  2. COPD  3. 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 Right hydronephrosis on the CT 10/29/2016  5. Dysphagia/weight loss secondary to #1  6.   Rash upper back 12/10/2016. Question etiology.   Disposition: Ms. Porten appears stable. She continues radiation. Plan to proceed with cycle 3 weekly Taxol/carboplatin today as scheduled.  Etiology of the upper back rash is unclear. She will treat symptomatically for now. She will contact the office if the rash worsens.  She will return for a follow-up visit on 12/24/2016. She will contact the office in the interim as outlined above or with any problems.    Patient seen with Dr. Benay Spice.   Ned Card ANP/GNP-BC   12/10/2016  9:58 AM  This was a shared visit with Ned Card. Ms. Pendley was interviewed and examined. The etiology of the rash is unclear. This has an unusual appearance and distribution for a radiation rash.  Julieanne Manson, M.D.

## 2016-12-11 ENCOUNTER — Ambulatory Visit
Admission: RE | Admit: 2016-12-11 | Discharge: 2016-12-11 | Disposition: A | Discharge status: Home / Self Care | Payer: Medicare Other | Source: Ambulatory Visit | Attending: Radiation Oncology | Admitting: Radiation Oncology

## 2016-12-11 DIAGNOSIS — Z510 Encounter for antineoplastic radiation therapy: Secondary | ICD-10-CM | POA: Diagnosis not present

## 2016-12-12 ENCOUNTER — Ambulatory Visit
Admission: RE | Admit: 2016-12-12 | Discharge: 2016-12-12 | Disposition: A | Discharge status: Home / Self Care | Payer: Medicare Other | Source: Ambulatory Visit | Attending: Radiation Oncology | Admitting: Radiation Oncology

## 2016-12-12 ENCOUNTER — Encounter: Payer: Self-pay | Admitting: Radiation Oncology

## 2016-12-12 VITALS — BP 121/76 | HR 93 | Temp 97.6°F | Resp 18 | Ht 61.0 in | Wt 71.0 lb

## 2016-12-12 DIAGNOSIS — C154 Malignant neoplasm of middle third of esophagus: Secondary | ICD-10-CM

## 2016-12-12 DIAGNOSIS — Z510 Encounter for antineoplastic radiation therapy: Secondary | ICD-10-CM | POA: Diagnosis not present

## 2016-12-12 NOTE — Progress Notes (Signed)
Weekly rad txs ,  Esophagus, c/o poor appetite. REeorts having nausea did not need to take compazine. T aking the liquid carafate, complained of a sore throat and back pain 10/10 taking Tylenol.   Constipation has been better the past week having a bowel movement daily except for yesterday.  Reports feeling very weak and fatigue.  Has a rash upper back burning, itching and stinging using Sonafine cream daily to bid. Wt Readings from Last 3 Encounters:  12/12/16 71 lb (32.2 kg)  12/10/16 70 lb 12.8 oz (32.1 kg)  12/03/16 70 lb 14.4 oz (32.2 kg)  BP 121/76   Pulse 93   Temp 97.6 F (36.4 C) (Oral)   Resp 18   Ht 5\' 1"  (1.549 m)   Wt 71 lb (32.2 kg)   SpO2 100%   BMI 13.42 kg/m

## 2016-12-12 NOTE — Progress Notes (Signed)
Department of Radiation Oncology  Phone:  [phone] Fax:        [phone]  Weekly Treatment Note    Name: Karen Osborn Date: 12/12/2016 MRN: [id number] DOB: Jan 01, 1941   Diagnosis:     ICD-9-CM ICD-10-CM   1. 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 acetaminophen (TYLENOL) 500 MG tablet Take 500 mg by mouth every 6 (six) hours as needed.    . docusate sodium (COLACE) 100 MG capsule Take 200 mg by mouth daily.    . prochlorperazine (COMPAZINE) 5 MG tablet Take 1 tablet (5 mg total) by mouth every 6 (six) hours as needed for nausea or vomiting. 60 tablet 1  . sucralfate (CARAFATE) 1 g tablet Take 1 tablet (1 g total) by mouth 4 (four) times daily. 120 tablet 2  . albuterol (PROAIR HFA) 108 (90 Base) MCG/ACT inhaler 2 puffs every 4 hours as needed only  if your can't catch your breath (Patient not taking: Reported on 12/12/2016) 1 Inhaler 11  . magnesium hydroxide (PHILLIPS MILK OF MAGNESIA) 400 MG/5ML suspension Take 30 mLs by mouth daily as needed for mild constipation.    . mineral oil (FLEET MINERAL OIL) enema Place 1 enema rectally once.    . polyethylene glycol (MIRALAX / GLYCOLAX) packet Take 17 g by mouth daily as needed for moderate constipation.    Marland Kitchen SPIRIVA RESPIMAT 2.5 MCG/ACT AERS INL 2 PFS INTO THE LUNGS D  5   No current facility-administered medications for this encounter.      ALLERGIES: Codeine   LABORATORY DATA:  Lab Results  Component Value Date   WBC 2.7 (L) 12/10/2016   HGB 13.2 12/10/2016   HCT 40.2 12/10/2016   MCV 93.1 12/10/2016   PLT 232 12/10/2016   Lab Results  Component Value Date   NA 134 (L) 12/10/2016   K 3.8 12/10/2016   CL 96 (L) 12/01/2016   CO2 27 12/10/2016   Lab Results  Component Value Date   ALT 12 12/10/2016   AST 13 12/10/2016   ALKPHOS 95  12/10/2016   BILITOT 0.41 12/10/2016     NARRATIVE: Karen Osborn was seen today for weekly treatment management. The chart was checked and the patient's films were reviewed.  She returns for weekly radiation treatment to the esophagus. She complains of poor appetite. Reports having nausea, but did not need to take compazine. Reports chronic back pain which has worsened in the last week. This back pain is described as 10/10 for which she is taking Tylenol. She is taking liquid carafate and complains of a sore throat. Her constipation has been better the past week, having a bowel movement daily except for yesterday. Reports feeling very weak and fatigued. She has a burning rash on her upper back, which itches and stings, she is using Sonafine cream daily bid. Reports the last couple of days she has had difficulty swallowing, attributing it to chemotherapy.   PHYSICAL EXAMINATION: height is 5\' 1"  (1.549 m) and weight is 71 lb (32.2 kg). Her oral temperature is 97.6 F (36.4 C). Her blood pressure is 121/76 and her pulse is 93. Her respiration is 18 and oxygen saturation is 100%.      Alert and in no acute distress.  ASSESSMENT: The patient is doing satisfactorily with treatment. Weight stable.  PLAN: We will  continue with the patient's radiation treatment as planned.    ------------------------------------------------  Jodelle Gross, MD, PhD  This document serves as a record of services personally performed by Kyung Rudd, MD. It was created on his behalf by Arlyce Harman, a trained medical scribe. The creation of this record is based on the scribe's personal observations and the provider's statements to them. This document has been checked and approved by the attending provider.

## 2016-12-13 ENCOUNTER — Ambulatory Visit
Admission: RE | Admit: 2016-12-13 | Discharge: 2016-12-13 | Disposition: A | Discharge status: Home / Self Care | Payer: Medicare Other | Source: Ambulatory Visit | Attending: Radiation Oncology | Admitting: Radiation Oncology

## 2016-12-13 DIAGNOSIS — Z510 Encounter for antineoplastic radiation therapy: Secondary | ICD-10-CM | POA: Diagnosis not present

## 2016-12-14 ENCOUNTER — Ambulatory Visit: Payer: Medicare Other

## 2016-12-15 ENCOUNTER — Other Ambulatory Visit: Payer: Self-pay | Admitting: Oncology

## 2016-12-16 ENCOUNTER — Ambulatory Visit
Admission: RE | Admit: 2016-12-16 | Discharge: 2016-12-16 | Disposition: A | Discharge status: Home / Self Care | Payer: Medicare Other | Source: Ambulatory Visit | Attending: Radiation Oncology | Admitting: Radiation Oncology

## 2016-12-16 ENCOUNTER — Other Ambulatory Visit: Payer: Self-pay | Admitting: *Deleted

## 2016-12-16 DIAGNOSIS — C154 Malignant neoplasm of middle third of esophagus: Secondary | ICD-10-CM

## 2016-12-16 DIAGNOSIS — Z510 Encounter for antineoplastic radiation therapy: Secondary | ICD-10-CM | POA: Diagnosis not present

## 2016-12-17 ENCOUNTER — Other Ambulatory Visit (HOSPITAL_BASED_OUTPATIENT_CLINIC_OR_DEPARTMENT_OTHER): Payer: Medicare Other

## 2016-12-17 ENCOUNTER — Ambulatory Visit (HOSPITAL_BASED_OUTPATIENT_CLINIC_OR_DEPARTMENT_OTHER): Payer: Medicare Other

## 2016-12-17 ENCOUNTER — Telehealth: Payer: Self-pay | Admitting: *Deleted

## 2016-12-17 ENCOUNTER — Ambulatory Visit: Payer: Medicare Other | Admitting: Nutrition

## 2016-12-17 ENCOUNTER — Ambulatory Visit
Admission: RE | Admit: 2016-12-17 | Discharge: 2016-12-17 | Disposition: A | Discharge status: Home / Self Care | Payer: Medicare Other | Source: Ambulatory Visit | Attending: Radiation Oncology | Admitting: Radiation Oncology

## 2016-12-17 VITALS — BP 120/72 | HR 98 | Temp 97.8°F | Resp 18 | Wt <= 1120 oz

## 2016-12-17 DIAGNOSIS — Z510 Encounter for antineoplastic radiation therapy: Secondary | ICD-10-CM | POA: Diagnosis not present

## 2016-12-17 DIAGNOSIS — C154 Malignant neoplasm of middle third of esophagus: Secondary | ICD-10-CM

## 2016-12-17 DIAGNOSIS — Z5111 Encounter for antineoplastic chemotherapy: Secondary | ICD-10-CM | POA: Diagnosis present

## 2016-12-17 LAB — COMPREHENSIVE METABOLIC PANEL
ALT: 16 U/L (ref 0–55)
AST: 14 U/L (ref 5–34)
Albumin: 3.4 g/dL — ABNORMAL LOW (ref 3.5–5.0)
Alkaline Phosphatase: 92 U/L (ref 40–150)
Anion Gap: 10 mEq/L (ref 3–11)
BUN: 20.7 mg/dL (ref 7.0–26.0)
CALCIUM: 9.7 mg/dL (ref 8.4–10.4)
CHLORIDE: 102 meq/L (ref 98–109)
CO2: 27 mEq/L (ref 22–29)
CREATININE: 0.6 mg/dL (ref 0.6–1.1)
EGFR: >90 mL/min/{1.73_m2} (ref >90)
Glucose: 103 mg/dl (ref 70–140)
Potassium: 3.6 mEq/L (ref 3.5–5.1)
Sodium: 139 mEq/L (ref 136–145)
Total Bilirubin: 0.39 mg/dL (ref 0.20–1.20)
Total Protein: 6.8 g/dL (ref 6.4–8.3)

## 2016-12-17 LAB — CBC WITH DIFFERENTIAL/PLATELET
BASO%: 0.9 % (ref 0.0–2.0)
Basophils Absolute: 0 10*3/uL (ref 0.0–0.1)
EOS%: 1.1 % (ref 0.0–7.0)
Eosinophils Absolute: 0 10*3/uL (ref 0.0–0.5)
HEMATOCRIT: 40.5 % (ref 34.8–46.6)
HEMOGLOBIN: 13.4 g/dL (ref 11.6–15.9)
LYMPH#: 0.2 10*3/uL — AB (ref 0.9–3.3)
LYMPH%: 7.5 % — ABNORMAL LOW (ref 14.0–49.7)
MCH: 30.5 pg (ref 25.1–34.0)
MCHC: 33 g/dL (ref 31.5–36.0)
MCV: 92.2 fL (ref 79.5–101.0)
MONO#: 0.4 10*3/uL (ref 0.1–0.9)
MONO%: 12.9 % (ref 0.0–14.0)
NEUT#: 2.5 10*3/uL (ref 1.5–6.5)
NEUT%: 77.6 % — ABNORMAL HIGH (ref 38.4–76.8)
Platelets: 229 10*3/uL (ref 145–400)
RBC: 4.39 10*6/uL (ref 3.70–5.45)
RDW: 13.2 % (ref 11.2–14.5)
WBC: 3.2 10*3/uL — ABNORMAL LOW (ref 3.9–10.3)

## 2016-12-17 MED ORDER — LORAZEPAM 0.5 MG PO TABS
0.2500 mg | ORAL_TABLET | Freq: Once | ORAL | Status: DC
  Filled 2016-12-17: qty 0.5

## 2016-12-17 MED ORDER — PALONOSETRON HCL INJECTION 0.25 MG/5ML
0.2500 mg | Freq: Once | INTRAVENOUS | Status: AC
  Administered 2016-12-17: 0.25 mg via INTRAVENOUS

## 2016-12-17 MED ORDER — PALONOSETRON HCL INJECTION 0.25 MG/5ML
INTRAVENOUS | Status: AC
  Filled 2016-12-17: qty 5

## 2016-12-17 MED ORDER — FAMOTIDINE IN NACL 20-0.9 MG/50ML-% IV SOLN
20.0000 mg | Freq: Once | INTRAVENOUS | Status: AC
  Administered 2016-12-17: 20 mg via INTRAVENOUS

## 2016-12-17 MED ORDER — SODIUM CHLORIDE 0.9 % IV SOLN
50.0000 mg/m2 | Freq: Once | INTRAVENOUS | Status: AC
  Administered 2016-12-17: 60 mg via INTRAVENOUS
  Filled 2016-12-17: qty 10

## 2016-12-17 MED ORDER — DEXAMETHASONE SODIUM PHOSPHATE 10 MG/ML IJ SOLN
10.0000 mg | Freq: Once | INTRAMUSCULAR | Status: AC
  Administered 2016-12-17: 10 mg via INTRAVENOUS

## 2016-12-17 MED ORDER — DEXAMETHASONE SODIUM PHOSPHATE 10 MG/ML IJ SOLN
INTRAMUSCULAR | Status: AC
  Filled 2016-12-17: qty 1

## 2016-12-17 MED ORDER — SODIUM CHLORIDE 0.9 % IV SOLN
101.0000 mg | Freq: Once | INTRAVENOUS | Status: AC
  Administered 2016-12-17: 100 mg via INTRAVENOUS
  Filled 2016-12-17: qty 10

## 2016-12-17 MED ORDER — SODIUM CHLORIDE 0.9 % IV SOLN
Freq: Once | INTRAVENOUS | Status: AC
  Administered 2016-12-17: 10:00:00 via INTRAVENOUS

## 2016-12-17 MED ORDER — FAMOTIDINE IN NACL 20-0.9 MG/50ML-% IV SOLN
INTRAVENOUS | Status: AC
  Filled 2016-12-17: qty 50

## 2016-12-17 NOTE — Progress Notes (Signed)
Pt complaint of nausea and difficulty eating/drinking since last Thursday (12/12/16). Lisa at chairside end of infusion to discuss home medications for nausea and need to push supplements and fluids at home. Pt to receive 1L IVF over 2 hours the next three days following her 0840 radiation appts. Pt and husband verbalized understanding. Passed information to infusion room secretary, Sharyn Lull.   Nutritionist, Carlin, also at chairside midway through infusion to discuss nutrition with pt. Pt weight requested by Judson Roch, RN which was acquired before pt d/c.

## 2016-12-17 NOTE — Progress Notes (Signed)
Nutrition follow-up completed with patient receiving chemotherapy for esophageal cancer. Last weight documented December 14.  Patient weighed 71 pounds. Patient reports she has increased dysphasia since Thursday of last week. She reports nausea and vomiting have increased and states she has had nausea every day since last week. Reports vomiting 2 times. Reports only takes Compazine once or twice since last week. Reports she ate Jell-O, half a cup of pudding and a half of a boost yesterday.   Patient continues to refuse feeding tube placement. Labs reviewed.  Nutrition diagnosis: Malnutrition continues.  Intervention:  I educated patient to take nausea medication as written to minimize nausea and increase oral intake. Reminded patient she needs about 4 boost plus a day to meet nutritional guidelines. Suggested she drink them with a cup and lid. Again recommended feeding tube placement. Patient will be weighed today. Spoke with nurse practitioner regarding patient who will evaluate need for additional medications and IVF. Spoke with pharmacist who agrees to write scheduled medications for patient. Questions answered and teachback method used.  Monitoring, evaluation, goals: Patient will tolerate increased oral intake and fluids to minimize further weight loss and prevent dehydration.  Next visit: I will continue to follow patient as needed.  **Disclaimer: This note was dictated with voice recognition software. Similar sounding words can inadvertently be transcribed and this note may contain transcription errors which may not have been corrected upon publication of note.**

## 2016-12-17 NOTE — Patient Instructions (Signed)
[facility] Discharge Instructions for Patients Receiving Chemotherapy  Today you received the following chemotherapy agents Taxol/Carboplatin To help prevent nausea and vomiting after your treatment, we encourage you to take your nausea medication as prescribed.   If you develop nausea and vomiting that is not controlled by your nausea medication, call the clinic.   BELOW ARE SYMPTOMS THAT SHOULD BE REPORTED IMMEDIATELY:  *FEVER GREATER THAN 100.5 F  *CHILLS WITH OR WITHOUT FEVER  NAUSEA AND VOMITING THAT IS NOT CONTROLLED WITH YOUR NAUSEA MEDICATION  *UNUSUAL SHORTNESS OF BREATH  *UNUSUAL BRUISING OR BLEEDING  TENDERNESS IN MOUTH AND THROAT WITH OR WITHOUT PRESENCE OF ULCERS  *URINARY PROBLEMS  *BOWEL PROBLEMS  UNUSUAL RASH Items with * indicate a potential emergency and should be followed up as soon as possible.  Feel free to call the clinic you have any questions or concerns. The clinic phone number is [phone].  Please show the CHEMO ALERT CARD at check-in to the Emergency Department and triage nurse.   

## 2016-12-17 NOTE — Telephone Encounter (Signed)
Per chemo RN I have scheduled ivf appts for the rest of the week. I have called and left message with appats.

## 2016-12-18 ENCOUNTER — Ambulatory Visit
Admission: RE | Admit: 2016-12-18 | Discharge: 2016-12-18 | Disposition: A | Discharge status: Home / Self Care | Payer: Medicare Other | Source: Ambulatory Visit | Attending: Radiation Oncology | Admitting: Radiation Oncology

## 2016-12-18 ENCOUNTER — Ambulatory Visit (HOSPITAL_BASED_OUTPATIENT_CLINIC_OR_DEPARTMENT_OTHER): Payer: Medicare Other

## 2016-12-18 ENCOUNTER — Other Ambulatory Visit: Payer: Self-pay | Admitting: Nurse Practitioner

## 2016-12-18 DIAGNOSIS — N1330 Unspecified hydronephrosis: Secondary | ICD-10-CM

## 2016-12-18 DIAGNOSIS — C154 Malignant neoplasm of middle third of esophagus: Secondary | ICD-10-CM

## 2016-12-18 DIAGNOSIS — Z510 Encounter for antineoplastic radiation therapy: Secondary | ICD-10-CM | POA: Diagnosis not present

## 2016-12-18 MED ORDER — SODIUM CHLORIDE 0.9 % IV SOLN
INTRAVENOUS | Status: AC
  Administered 2016-12-18: 10:00:00 via INTRAVENOUS

## 2016-12-18 NOTE — Progress Notes (Signed)
Patient reports temperature of 101.0 this morning around 4am. She states this is normal for her following chemo. She has generalized body aches. No vomiting or respiratory concerns. She has been nauseated. She is afebrile today.

## 2016-12-19 ENCOUNTER — Ambulatory Visit (HOSPITAL_BASED_OUTPATIENT_CLINIC_OR_DEPARTMENT_OTHER): Payer: Medicare Other

## 2016-12-19 ENCOUNTER — Other Ambulatory Visit: Payer: Self-pay | Admitting: *Deleted

## 2016-12-19 ENCOUNTER — Ambulatory Visit
Admission: RE | Admit: 2016-12-19 | Discharge: 2016-12-19 | Disposition: A | Discharge status: Home / Self Care | Payer: Medicare Other | Source: Ambulatory Visit | Attending: Radiation Oncology | Admitting: Radiation Oncology

## 2016-12-19 VITALS — BP 101/61 | HR 84 | Temp 98.4°F | Resp 17

## 2016-12-19 DIAGNOSIS — E860 Dehydration: Secondary | ICD-10-CM

## 2016-12-19 DIAGNOSIS — C154 Malignant neoplasm of middle third of esophagus: Secondary | ICD-10-CM

## 2016-12-19 DIAGNOSIS — N1330 Unspecified hydronephrosis: Secondary | ICD-10-CM

## 2016-12-19 DIAGNOSIS — Z510 Encounter for antineoplastic radiation therapy: Secondary | ICD-10-CM | POA: Diagnosis not present

## 2016-12-19 MED ORDER — SODIUM CHLORIDE 0.9 % IV SOLN
1000.0000 mL | Freq: Once | INTRAVENOUS | Status: AC
  Administered 2016-12-19: 1000 mL via INTRAVENOUS

## 2016-12-19 MED ORDER — SODIUM CHLORIDE 0.9 % IV SOLN
Freq: Once | INTRAVENOUS | Status: AC
  Administered 2016-12-19: 12:00:00 via INTRAVENOUS

## 2016-12-19 NOTE — Patient Instructions (Signed)
Dehydration, Adult Dehydration is when there is not enough fluid or water in your body. This happens when you lose more fluids than you take in. Dehydration can range from mild to very bad. It should be treated right away to keep it from getting very bad. Symptoms of mild dehydration may include:   Thirst.  Dry lips.  Slightly dry mouth.  Dry, warm skin.  Dizziness. Symptoms of moderate dehydration may include:   Very dry mouth.  Muscle cramps.  Dark pee (urine). Pee may be the color of tea.  Your body making less pee.  Your eyes making fewer tears.  Heartbeat that is uneven or faster than normal (palpitations).  Headache.  Light-headedness, especially when you stand up from sitting.  Fainting (syncope). Symptoms of very bad dehydration may include:   Changes in skin, such as:  Cold and clammy skin.  Blotchy (mottled) or pale skin.  Skin that does not quickly return to normal after being lightly pinched and let go (poor skin turgor).  Changes in body fluids, such as:  Feeling very thirsty.  Your eyes making fewer tears.  Not sweating when body temperature is high, such as in hot weather.  Your body making very little pee.  Changes in vital signs, such as:  Weak pulse.  Pulse that is more than 100 beats a minute when you are sitting still.  Fast breathing.  Low blood pressure.  Other changes, such as:  Sunken eyes.  Cold hands and feet.  Confusion.  Lack of energy (lethargy).  Trouble waking up from sleep.  Short-term weight loss.  Unconsciousness. Follow these instructions at home:  If told by your doctor, drink an ORS:  Make an ORS by using instructions on the package.  Start by drinking small amounts, about  cup (120 mL) every 5-10 minutes.  Slowly drink more until you have had the amount that your doctor said to have.  Drink enough clear fluid to keep your pee clear or pale yellow. If you were told to drink an ORS, finish the  ORS first, then start slowly drinking clear fluids. Drink fluids such as:  Water. Do not drink only water by itself. Doing that can make the salt (sodium) level in your body get too low (hyponatremia).  Ice chips.  Fruit juice that you have added water to (diluted).  Low-calorie sports drinks.  Avoid:  Alcohol.  Drinks that have a lot of sugar. These include high-calorie sports drinks, fruit juice that does not have water added, and soda.  Caffeine.  Foods that are greasy or have a lot of fat or sugar.  Take over-the-counter and prescription medicines only as told by your doctor.  Do not take salt tablets. Doing that can make the salt level in your body get too high (hypernatremia).  Eat foods that have minerals (electrolytes). Examples include bananas, oranges, potatoes, tomatoes, and spinach.  Keep all follow-up visits as told by your doctor. This is important. Contact a doctor if:  You have belly (abdominal) pain that:  Gets worse.  Stays in one area (localizes).  You have a rash.  You have a stiff neck.  You get angry or annoyed more easily than normal (irritability).  You are more sleepy than normal.  You have a harder time waking up than normal.  You feel:  Weak.  Dizzy.  Very thirsty.  You have peed (urinated) only a small amount of very dark pee during 6-8 hours. Get help right away if:  You   have symptoms of very bad dehydration.  You cannot drink fluids without throwing up (vomiting).  Your symptoms get worse with treatment.  You have a fever.  You have a very bad headache.  You are throwing up or having watery poop (diarrhea) and it:  Gets worse.  Does not go away.  You have blood or something green (bile) in your throw-up.  You have blood in your poop (stool). This may cause poop to look black and tarry.  You have not peed in 6-8 hours.  You pass out (faint).  Your heart rate when you are sitting still is more than 100 beats a  minute.  You have trouble breathing. This information is not intended to replace advice given to you by your health care provider. Make sure you discuss any questions you have with your health care provider. Document Released: 10/12/2009 Document Revised: 07/05/2016 Document Reviewed: 02/09/2016 Elsevier Interactive Patient Education  2017 Elsevier Inc.  

## 2016-12-19 NOTE — Progress Notes (Signed)
Pt BP 97/59 at 1120 post IVF. Informed Ned Card, NP. Order added for an additional 558mL IVF today.

## 2016-12-20 ENCOUNTER — Other Ambulatory Visit: Payer: Self-pay | Admitting: Nurse Practitioner

## 2016-12-20 ENCOUNTER — Ambulatory Visit
Admission: RE | Admit: 2016-12-20 | Discharge: 2016-12-20 | Disposition: A | Discharge status: Home / Self Care | Payer: Medicare Other | Source: Ambulatory Visit | Attending: Radiation Oncology | Admitting: Radiation Oncology

## 2016-12-20 ENCOUNTER — Telehealth: Payer: Self-pay | Admitting: Radiation Oncology

## 2016-12-20 ENCOUNTER — Encounter: Payer: Self-pay | Admitting: Radiation Oncology

## 2016-12-20 ENCOUNTER — Other Ambulatory Visit: Payer: Self-pay | Admitting: Medical Oncology

## 2016-12-20 ENCOUNTER — Ambulatory Visit (HOSPITAL_BASED_OUTPATIENT_CLINIC_OR_DEPARTMENT_OTHER): Payer: Medicare Other

## 2016-12-20 VITALS — BP 138/88 | HR 110 | Temp 97.6°F | Resp 16 | Wt 72.8 lb

## 2016-12-20 DIAGNOSIS — C154 Malignant neoplasm of middle third of esophagus: Secondary | ICD-10-CM

## 2016-12-20 DIAGNOSIS — E876 Hypokalemia: Secondary | ICD-10-CM | POA: Diagnosis present

## 2016-12-20 DIAGNOSIS — Z510 Encounter for antineoplastic radiation therapy: Secondary | ICD-10-CM | POA: Diagnosis not present

## 2016-12-20 MED ORDER — SODIUM CHLORIDE 0.9 % IV SOLN
INTRAVENOUS | Status: DC
  Administered 2016-12-20: 11:00:00 via INTRAVENOUS

## 2016-12-20 NOTE — Progress Notes (Addendum)
Weekly rad tx s esophagus  20/28 completed,  Dry desquamation rash on back and stomach and thighs , uses  sonafine daily when she remembers , fells yucjy and has started having chills this am,  Ate pudding this am, takes carafte only 3x day ,stated cannot do 4x, energy poor appetite poor, will  Start eating pop sicles to get fluids in 9:21 AM BP 138/88 (BP Location: Right Arm, Patient Position: Sitting, Cuff Size: Small)   Pulse (!) 110   Temp 97.6 F (36.4 C) (Oral)   Resp 16   Wt 72 lb 12.8 oz (33 kg)   BMI 13.76 kg/m   Wt Readings from Last 3 Encounters:  12/20/16 72 lb 12.8 oz (33 kg)  12/17/16 69 lb 14.2 oz (31.7 kg)  12/12/16 71 lb (32.2 kg)

## 2016-12-20 NOTE — Progress Notes (Signed)
Department of Radiation Oncology  Phone:  [phone] Fax:        [phone]  Weekly Treatment Note    Name: Karen Osborn Date: 12/20/2016 MRN: [id number] DOB: 08-Oct-1941   Diagnosis:     ICD-9-CM ICD-10-CM   1. Malignant neoplasm of middle third of esophagus ([place]) 150.4 C15.4 IR GASTROSTOMY TUBE MOD SED     Ambulatory referral to [place]     Current dose: 36 Gy  Current fraction: 20   MEDICATIONS: Current Outpatient Prescriptions  Medication Sig Dispense Refill  . acetaminophen (TYLENOL) 500 MG tablet Take 500 mg by mouth every 6 (six) hours as needed.    Marland Kitchen albuterol (PROAIR HFA) 108 (90 Base) MCG/ACT inhaler 2 puffs every 4 hours as needed only  if your can't catch your breath 1 Inhaler 11  . docusate sodium (COLACE) 100 MG capsule Take 200 mg by mouth daily.    . magnesium hydroxide (PHILLIPS MILK OF MAGNESIA) 400 MG/5ML suspension Take 30 mLs by mouth daily as needed for mild constipation.    . mineral oil (FLEET MINERAL OIL) enema Place 1 enema rectally once.    . polyethylene glycol (MIRALAX / GLYCOLAX) packet Take 17 g by mouth daily as needed for moderate constipation.    . prochlorperazine (COMPAZINE) 5 MG tablet Take 1 tablet (5 mg total) by mouth every 6 (six) hours as needed for nausea or vomiting. 60 tablet 1  . SPIRIVA RESPIMAT 2.5 MCG/ACT AERS INL 2 PFS INTO THE LUNGS D  5  . sucralfate (CARAFATE) 1 g tablet Take 1 tablet (1 g total) by mouth 4 (four) times daily. 120 tablet 2  . Wound Dressings (SONAFINE EX) Apply 1 application topically daily.     No current facility-administered medications for this encounter.    Facility-Administered Medications Ordered in Other Encounters  Medication Dose Route Frequency Provider Last Rate Last Dose  . 0.9 %  sodium chloride infusion   Intravenous Continuous Owens Shark, NP 500 mL/hr at 12/20/16 1030       ALLERGIES: Codeine   LABORATORY DATA:  Lab Results  Component Value Date   WBC 3.2 (L)  12/17/2016   HGB 13.4 12/17/2016   HCT 40.5 12/17/2016   MCV 92.2 12/17/2016   PLT 229 12/17/2016   Lab Results  Component Value Date   NA 139 12/17/2016   K 3.6 12/17/2016   CL 96 (L) 12/01/2016   CO2 27 12/17/2016   Lab Results  Component Value Date   ALT 16 12/17/2016   AST 14 12/17/2016   ALKPHOS 92 12/17/2016   BILITOT 0.39 12/17/2016     NARRATIVE: Karen Osborn was seen today for weekly treatment management. The chart was checked and the patient's films were reviewed.  The patient has completed 20/28 fractions to the esophagus. The patient reports feeling "yucky" and has started having chills this morning. She reports poor energy levels. She ate pudding this morning as her appetite is very poor. She takes Carafate x 3, and is unable to take any more per day. Per nursing, the patient has dry desquamation on stomach and thighs. The patient reports using Sonafine daily when she remembers. The patient received IV fluids yesterday.  PHYSICAL EXAMINATION: weight is 72 lb 12.8 oz (33 kg). Her oral temperature is 97.6 F (36.4 C). Her blood pressure is 138/88 and her pulse is 110 (abnormal). Her respiration is 16.  Alert and in no acute distress.   ASSESSMENT: The patient is doing  satisfactorily with treatment. Weight stable.  PLAN: We will continue with the patient's radiation treatment as planned. We discussed the reality that the patient may need to have a feeding tube placed to increase her weight and nutrition; the patient is in agreement and I will refer her to radiology for discussion of this procedure.  ------------------------------------------------   Tyler Pita, MD [place] Director and Director of Stereotactic Radiosurgery Direct Dial: [phone]  Fax: [phone] [email or web address]  Skype  [email or web address]  This document serves as a record of services personally performed by Tyler Pita, MD and Shona Simpson, PA. It was  created on his behalf by Maryla Morrow, a trained medical scribe. The creation of this record is based on the scribe's personal observations and the provider's statements to them. This document has been checked and approved by the attending provider.

## 2016-12-20 NOTE — Telephone Encounter (Signed)
I called the patient's daughter Leonia Reeves to try and communicate our discussion this morning about a feeding tube placement. I LM and encouraged her to call back if she has any questions or concerns regarding this.

## 2016-12-21 ENCOUNTER — Ambulatory Visit (HOSPITAL_BASED_OUTPATIENT_CLINIC_OR_DEPARTMENT_OTHER): Payer: Medicare Other

## 2016-12-21 DIAGNOSIS — C154 Malignant neoplasm of middle third of esophagus: Secondary | ICD-10-CM | POA: Diagnosis present

## 2016-12-21 MED ORDER — SODIUM CHLORIDE 0.9 % IV SOLN
INTRAVENOUS | Status: AC
  Administered 2016-12-21: 10:00:00 via INTRAVENOUS

## 2016-12-23 ENCOUNTER — Ambulatory Visit: Payer: Medicare Other

## 2016-12-23 ENCOUNTER — Other Ambulatory Visit: Payer: Self-pay | Admitting: Oncology

## 2016-12-24 ENCOUNTER — Ambulatory Visit
Admission: RE | Admit: 2016-12-24 | Discharge: 2016-12-24 | Disposition: A | Discharge status: Home / Self Care | Payer: Medicare Other | Source: Ambulatory Visit | Attending: Radiation Oncology | Admitting: Radiation Oncology

## 2016-12-24 ENCOUNTER — Other Ambulatory Visit (HOSPITAL_BASED_OUTPATIENT_CLINIC_OR_DEPARTMENT_OTHER): Payer: Medicare Other

## 2016-12-24 ENCOUNTER — Ambulatory Visit (HOSPITAL_BASED_OUTPATIENT_CLINIC_OR_DEPARTMENT_OTHER): Payer: Medicare Other

## 2016-12-24 ENCOUNTER — Ambulatory Visit (HOSPITAL_BASED_OUTPATIENT_CLINIC_OR_DEPARTMENT_OTHER): Payer: Medicare Other | Admitting: Oncology

## 2016-12-24 ENCOUNTER — Encounter: Payer: Self-pay | Admitting: *Deleted

## 2016-12-24 ENCOUNTER — Telehealth: Payer: Self-pay | Admitting: Oncology

## 2016-12-24 VITALS — BP 121/69 | HR 96

## 2016-12-24 VITALS — BP 126/75 | HR 100 | Temp 98.5°F | Resp 18 | Ht 61.0 in | Wt 72.2 lb

## 2016-12-24 DIAGNOSIS — R634 Abnormal weight loss: Secondary | ICD-10-CM | POA: Diagnosis not present

## 2016-12-24 DIAGNOSIS — C50011 Malignant neoplasm of nipple and areola, right female breast: Secondary | ICD-10-CM

## 2016-12-24 DIAGNOSIS — C154 Malignant neoplasm of middle third of esophagus: Secondary | ICD-10-CM | POA: Diagnosis not present

## 2016-12-24 DIAGNOSIS — Z510 Encounter for antineoplastic radiation therapy: Secondary | ICD-10-CM | POA: Diagnosis not present

## 2016-12-24 DIAGNOSIS — E876 Hypokalemia: Secondary | ICD-10-CM

## 2016-12-24 DIAGNOSIS — J449 Chronic obstructive pulmonary disease, unspecified: Secondary | ICD-10-CM

## 2016-12-24 LAB — COMPREHENSIVE METABOLIC PANEL
ALBUMIN: 3 g/dL — AB (ref 3.5–5.0)
ALK PHOS: 72 U/L (ref 40–150)
ALT: 17 U/L (ref 0–55)
AST: 17 U/L (ref 5–34)
Anion Gap: 9 mEq/L (ref 3–11)
BILIRUBIN TOTAL: 0.51 mg/dL (ref 0.20–1.20)
BUN: 9.5 mg/dL (ref 7.0–26.0)
CO2: 33 mEq/L — ABNORMAL HIGH (ref 22–29)
CREATININE: 0.6 mg/dL (ref 0.6–1.1)
Calcium: 9 mg/dL (ref 8.4–10.4)
Chloride: 100 mEq/L (ref 98–109)
EGFR: >90 mL/min/{1.73_m2} (ref >90)
GLUCOSE: 126 mg/dL (ref 70–140)
Potassium: 2.5 mEq/L — CL (ref 3.5–5.1)
SODIUM: 142 meq/L (ref 136–145)
TOTAL PROTEIN: 5.5 g/dL — AB (ref 6.4–8.3)

## 2016-12-24 LAB — CBC WITH DIFFERENTIAL/PLATELET
BASO%: 0.4 % (ref 0.0–2.0)
BASOS ABS: 0 10*3/uL (ref 0.0–0.1)
EOS ABS: 0 10*3/uL (ref 0.0–0.5)
EOS%: 1.1 % (ref 0.0–7.0)
HEMATOCRIT: 39.8 % (ref 34.8–46.6)
HEMOGLOBIN: 13.5 g/dL (ref 11.6–15.9)
LYMPH%: 11.1 % — ABNORMAL LOW (ref 14.0–49.7)
MCH: 30.8 pg (ref 25.1–34.0)
MCHC: 33.9 g/dL (ref 31.5–36.0)
MCV: 90.9 fL (ref 79.5–101.0)
MONO#: 0.3 10*3/uL (ref 0.1–0.9)
MONO%: 12.3 % (ref 0.0–14.0)
NEUT%: 75.1 % (ref 38.4–76.8)
NEUTROS ABS: 2 10*3/uL (ref 1.5–6.5)
Platelets: 153 10*3/uL (ref 145–400)
RBC: 4.38 10*6/uL (ref 3.70–5.45)
RDW: 13.6 % (ref 11.2–14.5)
WBC: 2.6 10*3/uL — AB (ref 3.9–10.3)
lymph#: 0.3 10*3/uL — ABNORMAL LOW (ref 0.9–3.3)

## 2016-12-24 MED ORDER — SODIUM CHLORIDE 0.9 % IV SOLN: Freq: Once | INTRAVENOUS | Status: DC

## 2016-12-24 MED ORDER — SODIUM CHLORIDE 0.9 % IV SOLN
Freq: Once | INTRAVENOUS | Status: AC
  Administered 2016-12-24: 11:00:00 via INTRAVENOUS
  Filled 2016-12-24: qty 500

## 2016-12-24 MED ORDER — SODIUM CHLORIDE 0.9 % IV SOLN
INTRAVENOUS | Status: DC
  Administered 2016-12-24: 11:00:00 via INTRAVENOUS

## 2016-12-24 NOTE — Patient Instructions (Signed)
Hypokalemia Hypokalemia means that the amount of potassium in the blood is lower than normal.Potassium is a chemical that helps regulate the amount of fluid in the body (electrolyte). It also stimulates muscle tightening (contraction) and helps nerves work properly.Normally, most of the body's potassium is inside of cells, and only a very small amount is in the blood. Because the amount in the blood is so small, minor changes to potassium levels in the blood can be life-threatening. What are the causes? This condition may be caused by:  Antibiotic medicine.  Diarrhea or vomiting. Taking too much of a medicine that helps you have a bowel movement (laxative) can cause diarrhea and lead to hypokalemia.  Chronic kidney disease (CKD).  Medicines that help the body get rid of excess fluid (diuretics).  Eating disorders, such as bulimia.  Low magnesium levels in the body.  Sweating a lot. What are the signs or symptoms? Symptoms of this condition include:  Weakness.  Constipation.  Fatigue.  Muscle cramps.  Mental confusion.  Skipped heartbeats or irregular heartbeat (palpitations).  Tingling or numbness. How is this diagnosed? This condition is diagnosed with a blood test. How is this treated? Hypokalemia can be treated by taking potassium supplements by mouth or adjusting the medicines that you take. Treatment may also include eating more foods that contain a lot of potassium. If your potassium level is very low, you may need to get potassium through an IV tube in one of your veins and be monitored in the hospital. Follow these instructions at home:  Take over-the-counter and prescription medicines only as told by your health care provider. This includes vitamins and supplements.  Eat a healthy diet. A healthy diet includes fresh fruits and vegetables, whole grains, healthy fats, and lean proteins.  If instructed, eat more foods that contain a lot of potassium, such  as:  Nuts, such as peanuts and pistachios.  Seeds, such as sunflower seeds and pumpkin seeds.  Peas, lentils, and lima beans.  Whole grain and bran cereals and breads.  Fresh fruits and vegetables, such as apricots, avocado, bananas, cantaloupe, kiwi, oranges, tomatoes, asparagus, and potatoes.  Orange juice.  Tomato juice.  Red meats.  Yogurt.  Keep all follow-up visits as told by your health care provider. This is important. Contact a health care provider if:  You have weakness that gets worse.  You feel your heart pounding or racing.  You vomit.  You have diarrhea.  You have diabetes (diabetes mellitus) and you have trouble keeping your blood sugar (glucose) in your target range. Get help right away if:  You have chest pain.  You have shortness of breath.  You have vomiting or diarrhea that lasts for more than 2 days.  You faint. This information is not intended to replace advice given to you by your health care provider. Make sure you discuss any questions you have with your health care provider. Document Released: 12/16/2005 Document Revised: 08/03/2016 Document Reviewed: 08/03/2016 Elsevier Interactive Patient Education  2017 Elsevier Inc.  

## 2016-12-24 NOTE — Progress Notes (Signed)
Oncology Nurse Navigator Documentation  Oncology Nurse Navigator Flowsheets 12/24/2016  Navigator Location [facility]  Referral date to RadOnc/MedOnc -  Navigator Encounter Type Follow-up Appt  Abnormal Finding Date -  Confirmed Diagnosis Date -  [facility] Clinic Date -  Treatment Initiated Date -  Patient Visit Type MedOnc  Treatment Phase Active Tx--RT with weekly Taxol/Carbo--hold chemo this week and give IV fluids and K+  Barriers/Navigation Needs Education;Coordination of Care  Education Pain/ Symptom Management--swelling in feet--elevation and need to increase her protein level. Strongly encouraged her to follow through on the feeding tube  Interventions Coordination of Care;Education  Coordination of Care Other--message to dietician regarding feeding tube orders for 12/28 procedure.  Education Method Teach-back  Support Groups/Services -  Acuity -  Time Spent with Patient 15

## 2016-12-24 NOTE — Progress Notes (Signed)
1100: Per Lorriane Shire RN per Dr. Benay Spice No treatment today, pt to receive IVF with Potassium.  1555: Pt and VS stable at discharge.

## 2016-12-24 NOTE — Progress Notes (Signed)
  Karen Osborn OFFICE PROGRESS NOTE   Diagnosis: Esophagus cancer  INTERVAL HISTORY:   Karen Osborn returns as scheduled. She continues daily radiation. She completed week 4 of Taxol/carboplatin on 12/17/2016.she will receive intravenous fluids on 3 days last week secondary to limited oral intake. She reports persistent dysphagia and odynophagia.she takes and less than 1 can of "boost "daily. She is scheduled for placement of a feeding tube on 12/26/2016.  Objective:  Vital signs in last 24 hours:  Blood pressure 126/75, pulse 100, temperature 98.5 F (36.9 C), temperature source Oral, resp. rate 18, height 5\' 1"  (1.549 m), weight 72 lb 3.2 oz (32.7 kg), SpO2 96 %.    HEENT: no thrush or ulcers, the mucous membranes are mo Resp: distant breath sounds, scattered wheeze, no respiratory distress Cardio: regular rate and rhythm GI: no hepatosplenomegaly, nontender Vascular: pitting edema at the ankle and foot bilaterally Skin: Flat light pink macular rash over the lower trunk and thighs    Lab Results:  Lab Results  Component Value Date   WBC 2.6 (L) 12/24/2016   HGB 13.5 12/24/2016   HCT 39.8 12/24/2016   MCV 90.9 12/24/2016   PLT 153 12/24/2016   NEUTROABS 2.0 12/24/2016  potassium 2.5, creatinine 0.6, albumin 3.0   Medications: I have reviewed the patient's current medications.  Assessment/Plan: 1. Squamous cell carcinoma the midesophagus ? Staging CT scans 10/29/2016 with no evidence of metastatic disease; nonspecific liver lesions, right hydronephrosis, retroperitoneal lymph nodes, and sclerotic right seventh rib lesion ? Staging PET scan 11/13/2016-low left jugular/supraclavicular node measuring 5 mm and SUV max of 2.6; left paratracheal node measuring8 mm and SUV max 5.8; periaorticnodes including a 6 mm node SUV max 4.4; right retrocrural hypermetabolic node measures 8 mm and SUV max of 9.4;hypermetabolism corresponding to the mid esophageal primary  SUV max 15.6; no right hepatic lobe hypermetabolism; no hypermetabolism to correspond to the retroperitoneal mild adenopathy on prior exam. ? Initiation of radiation 11/25/2016 and concurrent weekly Taxol/carboplatin 11/26/2016  2. COPD  3. 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 Right hydronephrosis on the CT 10/29/2016  5. Dysphagia/weight loss secondary to #1  6.   Rash upper back 12/10/2016. Question etiology.    Disposition:  Karen Osborn continues to have symptoms related to the esophagus mass and radiation. She has limited oral intake. The potassium level is low. She will receive intravenous fluids with potassium today. We will check a potassium level on 12/26/2016.  The final week of chemotherapy will be placed on hold. She will return for an office visit with the plan to complete week 5 of Taxol/carboplatin next week.  We will arrange for a nutrition appointment. She will undergo placement of feeding tube on 12/26/2016.  Betsy Coder, MD  12/24/2016  9:51 AM

## 2016-12-24 NOTE — Telephone Encounter (Signed)
Lab and follow up appointments scheduled per 12/24/16 los. Patient's husband was given a copy of the appointment schedule, per 12/24/16 los.

## 2016-12-25 ENCOUNTER — Other Ambulatory Visit: Payer: Self-pay | Admitting: General Surgery

## 2016-12-25 ENCOUNTER — Ambulatory Visit
Admission: RE | Admit: 2016-12-25 | Discharge: 2016-12-25 | Disposition: A | Discharge status: Home / Self Care | Payer: Medicare Other | Source: Ambulatory Visit | Attending: Radiation Oncology | Admitting: Radiation Oncology

## 2016-12-25 ENCOUNTER — Ambulatory Visit: Payer: Medicare Other

## 2016-12-25 DIAGNOSIS — Z510 Encounter for antineoplastic radiation therapy: Secondary | ICD-10-CM | POA: Diagnosis not present

## 2016-12-26 ENCOUNTER — Ambulatory Visit (HOSPITAL_COMMUNITY)
Admission: RE | Admit: 2016-12-26 | Discharge: 2016-12-26 | Disposition: A | Discharge status: Home / Self Care | Payer: Medicare Other | Source: Ambulatory Visit | Attending: Radiation Oncology | Admitting: Radiation Oncology

## 2016-12-26 ENCOUNTER — Encounter: Payer: Self-pay | Admitting: *Deleted

## 2016-12-26 ENCOUNTER — Ambulatory Visit: Payer: Medicare Other

## 2016-12-26 ENCOUNTER — Ambulatory Visit
Admission: RE | Admit: 2016-12-26 | Discharge: 2016-12-26 | Disposition: A | Discharge status: Home / Self Care | Payer: Medicare Other | Source: Ambulatory Visit | Attending: Radiation Oncology | Admitting: Radiation Oncology

## 2016-12-26 ENCOUNTER — Encounter (HOSPITAL_COMMUNITY): Payer: Self-pay

## 2016-12-26 ENCOUNTER — Ambulatory Visit: Payer: Medicare Other | Admitting: Radiation Oncology

## 2016-12-26 ENCOUNTER — Other Ambulatory Visit: Payer: Medicare Other

## 2016-12-26 DIAGNOSIS — Z9221 Personal history of antineoplastic chemotherapy: Secondary | ICD-10-CM | POA: Diagnosis not present

## 2016-12-26 DIAGNOSIS — K219 Gastro-esophageal reflux disease without esophagitis: Secondary | ICD-10-CM | POA: Insufficient documentation

## 2016-12-26 DIAGNOSIS — C154 Malignant neoplasm of middle third of esophagus: Secondary | ICD-10-CM

## 2016-12-26 DIAGNOSIS — Z510 Encounter for antineoplastic radiation therapy: Secondary | ICD-10-CM | POA: Diagnosis not present

## 2016-12-26 DIAGNOSIS — Z8249 Family history of ischemic heart disease and other diseases of the circulatory system: Secondary | ICD-10-CM | POA: Diagnosis not present

## 2016-12-26 DIAGNOSIS — Z853 Personal history of malignant neoplasm of breast: Secondary | ICD-10-CM | POA: Insufficient documentation

## 2016-12-26 DIAGNOSIS — Z803 Family history of malignant neoplasm of breast: Secondary | ICD-10-CM | POA: Diagnosis not present

## 2016-12-26 DIAGNOSIS — F17210 Nicotine dependence, cigarettes, uncomplicated: Secondary | ICD-10-CM | POA: Insufficient documentation

## 2016-12-26 DIAGNOSIS — J449 Chronic obstructive pulmonary disease, unspecified: Secondary | ICD-10-CM | POA: Diagnosis not present

## 2016-12-26 DIAGNOSIS — Z808 Family history of malignant neoplasm of other organs or systems: Secondary | ICD-10-CM | POA: Diagnosis not present

## 2016-12-26 DIAGNOSIS — R1310 Dysphagia, unspecified: Secondary | ICD-10-CM | POA: Diagnosis not present

## 2016-12-26 DIAGNOSIS — E46 Unspecified protein-calorie malnutrition: Secondary | ICD-10-CM | POA: Diagnosis not present

## 2016-12-26 DIAGNOSIS — Z681 Body mass index (BMI) 19 or less, adult: Secondary | ICD-10-CM | POA: Insufficient documentation

## 2016-12-26 DIAGNOSIS — C159 Malignant neoplasm of esophagus, unspecified: Secondary | ICD-10-CM | POA: Insufficient documentation

## 2016-12-26 DIAGNOSIS — Z923 Personal history of irradiation: Secondary | ICD-10-CM | POA: Insufficient documentation

## 2016-12-26 DIAGNOSIS — Z885 Allergy status to narcotic agent status: Secondary | ICD-10-CM | POA: Diagnosis not present

## 2016-12-26 HISTORY — PX: IR GENERIC HISTORICAL: IMG1180011

## 2016-12-26 LAB — CBC
HCT: 38.1 % (ref 36.0–46.0)
HEMOGLOBIN: 12.9 g/dL (ref 12.0–15.0)
MCH: 31.1 pg (ref 26.0–34.0)
MCHC: 33.9 g/dL (ref 30.0–36.0)
MCV: 91.8 fL (ref 78.0–100.0)
Platelets: 158 10*3/uL (ref 150–400)
RBC: 4.15 MIL/uL (ref 3.87–5.11)
RDW: 14.2 % (ref 11.5–15.5)
WBC: 2.4 10*3/uL — ABNORMAL LOW (ref 4.0–10.5)

## 2016-12-26 LAB — PROTIME-INR
INR: 0.95
PROTHROMBIN TIME: 12.7 s (ref 11.4–15.2)

## 2016-12-26 MED ORDER — ONDANSETRON HCL 4 MG/2ML IJ SOLN: 4.0000 mg | INTRAMUSCULAR | Status: DC | PRN

## 2016-12-26 MED ORDER — IOPAMIDOL (ISOVUE-300) INJECTION 61%
50.0000 mL | Freq: Once | INTRAVENOUS | Status: AC | PRN
  Administered 2016-12-26: 10 mL via INTRAVENOUS

## 2016-12-26 MED ORDER — FENTANYL CITRATE (PF) 100 MCG/2ML IJ SOLN
INTRAMUSCULAR | Status: AC
  Filled 2016-12-26: qty 4

## 2016-12-26 MED ORDER — HYDROCODONE-ACETAMINOPHEN 7.5-325 MG/15ML PO SOLN
10.0000 mL | ORAL | Status: DC | PRN
  Administered 2016-12-26: 10 mL via ORAL
  Filled 2016-12-26 (×2): qty 15

## 2016-12-26 MED ORDER — MIDAZOLAM HCL 2 MG/2ML IJ SOLN
INTRAMUSCULAR | Status: AC | PRN
  Administered 2016-12-26: 0.5 mg via INTRAVENOUS
  Administered 2016-12-26 (×2): 1 mg via INTRAVENOUS

## 2016-12-26 MED ORDER — GLUCAGON HCL RDNA (DIAGNOSTIC) 1 MG IJ SOLR
INTRAMUSCULAR | Status: AC | PRN
  Administered 2016-12-26: 1 mg via INTRAVENOUS

## 2016-12-26 MED ORDER — FENTANYL CITRATE (PF) 100 MCG/2ML IJ SOLN
INTRAMUSCULAR | Status: AC | PRN
  Administered 2016-12-26: 25 ug via INTRAVENOUS
  Administered 2016-12-26: 50 ug via INTRAVENOUS
  Administered 2016-12-26: 25 ug via INTRAVENOUS

## 2016-12-26 MED ORDER — SODIUM CHLORIDE 0.9 % IV SOLN
INTRAVENOUS | Status: DC
  Administered 2016-12-26: 13:00:00 via INTRAVENOUS

## 2016-12-26 MED ORDER — HYDROMORPHONE HCL 1 MG/ML IJ SOLN: 0.5000 mg | INTRAMUSCULAR | Status: DC | PRN

## 2016-12-26 MED ORDER — GLUCAGON HCL RDNA (DIAGNOSTIC) 1 MG IJ SOLR
INTRAMUSCULAR | Status: AC
  Filled 2016-12-26: qty 1

## 2016-12-26 MED ORDER — CEFAZOLIN SODIUM-DEXTROSE 2-4 GM/100ML-% IV SOLN
2.0000 g | INTRAVENOUS | Status: AC
  Administered 2016-12-26: 2 g via INTRAVENOUS
  Filled 2016-12-26: qty 100

## 2016-12-26 MED ORDER — HYDROCODONE-ACETAMINOPHEN 5-325 MG PO TABS: 1.0000 | ORAL_TABLET | ORAL | Status: DC | PRN

## 2016-12-26 MED ORDER — LIDOCAINE HCL 1 % IJ SOLN
INTRAMUSCULAR | Status: AC
  Filled 2016-12-26: qty 20

## 2016-12-26 MED ORDER — LIDOCAINE HCL 1 % IJ SOLN
INTRAMUSCULAR | Status: AC | PRN
  Administered 2016-12-26: 10 mL

## 2016-12-26 MED ORDER — MIDAZOLAM HCL 2 MG/2ML IJ SOLN
INTRAMUSCULAR | Status: AC
  Filled 2016-12-26: qty 4

## 2016-12-26 MED ORDER — IOPAMIDOL (ISOVUE-300) INJECTION 61%
INTRAVENOUS | Status: AC
  Filled 2016-12-26: qty 50

## 2016-12-26 NOTE — Procedures (Signed)
Interventional Radiology Procedure Note  Procedure: Placement of percutaneous 20F pull-through gastrostomy tube. Complications: None Recommendations: - NPO except for sips and chips remainder of today and overnight - Maintain G-tube to LWS until tomorrow morning  - May advance diet as tolerated and begin using tube tomorrow morning  Signed,  Hanley Rispoli K. Eliska Hamil, MD   

## 2016-12-26 NOTE — H&P (Signed)
Chief Complaint: esophageal cancer  Referring Physician:Dr. Izola Price. Sherrill  Supervising Physician: Jacqulynn Cadet  Patient Status: [facility] - Out-pt  HPI: Karen Osborn is an 75 y.o. female who was diagnosed with esophageal cancer in October.  She has started chemotherapy along with radiation therapy.  She had lost weight secondary to dysphagia and odynophagia.  Due to malnutrition, we have been asked to place a g-tube for nutritional support.  She presents today for this procedure.  She was found to have a potassium of 2.5 on 12/26, but received IV K while at treatment.    Past Medical History:  Past Medical History:  Diagnosis Date  . Breast cancer ([place])    left  . COPD (chronic obstructive pulmonary disease) ([place])   . Dyspnea   . GERD (gastroesophageal reflux disease)     Past Surgical History:  Past Surgical History:  Procedure Laterality Date  . BREAST LUMPECTOMY Left 1/132006  . ESOPHAGOGASTRODUODENOSCOPY (EGD) WITH PROPOFOL N/A 10/24/2016   Procedure: ESOPHAGOGASTRODUODENOSCOPY (EGD) WITH PROPOFOL;  Surgeon: Wilford Corner, MD;  Location: [facility] ENDOSCOPY;  Service: Endoscopy;  Laterality: N/A;    Family History:  Family History  Problem Relation Age of Onset  . Heart attack Mother   . Heart attack Father   . Arrhythmia Sister   . Cancer Sister     Breast Cancer  . COPD Brother   . Cancer Sister     Skin Cancer, Breast Cancer    Social History:  reports that she has been smoking Cigarettes.  She has a 27.00 pack-year smoking history. She has never used smokeless tobacco. She reports that she does not drink alcohol or use drugs.  Allergies:  Allergies  Allergen Reactions  . Benadryl [Diphenhydramine Hcl] Other (See Comments)    Jittery, patient states "legs bouncing up and down"   . Codeine Nausea And Vomiting    Medications: Medications reviewed in epic.  Please HPI for pertinent positives, otherwise complete 10 system ROS negative.  Mallampati  Score: MD Evaluation Airway: WNL Heart: WNL Abdomen: WNL Chest/ Lungs: WNL ASA  Classification: 3 Mallampati/Airway Score: One  Physical Exam: BP 107/70 (BP Location: Right Arm)   Pulse (!) 103   Temp 97.8 F (36.6 C) (Oral)   Resp 18   Ht 5\' 1"  (1.549 m)   Wt 70 lb 9.6 oz (32 kg)   SpO2 96%   BMI 13.34 kg/m  Body mass index is 13.34 kg/m. General: pleasant, frail white female who is laying in bed in NAD HEENT: head is normocephalic, atraumatic.  Sclera are noninjected.  PERRL.  Ears and nose without any masses or lesions.  Mouth is pink and moist Heart: regular rhythm, slightly tachycardic.  Normal s1,s2. No obvious murmurs, gallops, or rubs noted.  Palpable radial and pedal pulses bilaterally Lungs: CTAB, no wheezes, rhonchi, or rales noted.  Respiratory effort nonlabored Abd: soft, skinny, NT, ND, +BS, no masses, hernias, or organomegaly Psych: A&Ox3 with an appropriate affect.   Labs: Results for orders placed or performed during the hospital encounter of 12/26/16 (from the past 48 hour(s))  CBC     Status: Abnormal   Collection Time: 12/26/16  1:08 PM  Result Value Ref Range   WBC 2.4 (L) 4.0 - 10.5 K/uL   RBC 4.15 3.87 - 5.11 MIL/uL   Hemoglobin 12.9 12.0 - 15.0 g/dL   HCT 38.1 36.0 - 46.0 %   MCV 91.8 78.0 - 100.0 fL   MCH 31.1 26.0 - 34.0  pg   MCHC 33.9 30.0 - 36.0 g/dL   RDW 14.2 11.5 - 15.5 %   Platelets 158 150 - 400 K/uL  Protime-INR     Status: None   Collection Time: 12/26/16  1:08 PM  Result Value Ref Range   Prothrombin Time 12.7 11.4 - 15.2 seconds   INR 0.95     Imaging: No results found.  Assessment/Plan 1. Esophageal cancer -we will plan to place a g-tube today to assist the patient with nutritional support. -labs and vitals reviewed -Risks and Benefits discussed with the patient including, but not limited to the need for a barium enema during the procedure, bleeding, infection, peritonitis, or damage to adjacent structures. All of the  patient's questions were answered, patient is agreeable to proceed. Consent signed and in chart.   Thank you for this interesting consult.  I greatly enjoyed meeting Karen Osborn and look forward to participating in their care.  A copy of this report was sent to the requesting provider on this date.  Electronically Signed: Henreitta Cea 12/26/2016, 1:38 PM   I spent a total of  30 Minutes   in face to face in clinical consultation, greater than 50% of which was counseling/coordinating care for esophageal cancer

## 2016-12-26 NOTE — Progress Notes (Addendum)
Oncology Nurse Navigator Documentation  Met Karen Osborn [facility] Short Stay prior to PEG placement to provide PEG education.  Her husband Karen daughter were at the bedside.  Using  PEG teaching device   Karen Teach Back, provided education for PEG use Karen care, including: hand hygiene, gravity bolus administration of daily water flushes, nutritional supplement, fluids Karen medications; care of tube insertion site including daily dressing change Karen cleaning; S&S of infection.  [name] Osborn correctly provided return demonstration of  gravity administration of water, verbalized dressing change Karen cleaning procedures.  Karen Osborn provided accurate return demonstration for PEG cleaning Karen dressing change.   Provided Karen reviewed written guidance for the above.  Provided patient the following PEG supplies:  Split gauze (ca. 10)  Paper tape (1 roll)  Mesh briefs (package of 2)  60 mL syringe (2)  They understand Karen Osborn, RD, will provide nutritional supplement Karen further guidance tomorrow during scheduled appt.. I will be available for ongoing PEG educational support as will Karen Osborn, RD.  Karen [place], RN, BSN, [place] Oncology Nurse [place] [facility] [place] [phone]

## 2016-12-26 NOTE — Discharge Instructions (Signed)
Gastrostomy Tube Home Guide, Adult A gastrostomy tube is a tube that is surgically placed into the stomach. It is also called a G-tube. G-tubes are used when a person is unable to eat and drink enough on their own to stay healthy. The tube is inserted into the stomach through a small cut (incision) in the skin. This tube is used for:  Feeding.  Giving medication. Gastrostomy tube care  Wash your hands with soap and water.  Remove the old dressing (if any). Some styles of G-tubes may need a dressing inserted between the skin and the G-tube. Other types of G-tubes do not require a dressing. Ask your health care provider if a dressing is needed.  Check the area where the tube enters the skin (insertion site) for redness, swelling, or pus-like (purulent) drainage. A small amount of clear or tan liquid drainage is normal. Check to make sure scar tissue (skin) is not growing around the insertion site. This could have a raised, bumpy appearance.  A cotton swab can be used to clean the skin around the tube:  When the G-tube is first put in, a normal saline solution or water can be used to clean the skin.  Mild soap and warm water can be used when the skin around the G-tube site has healed.  Roll the cotton swab around the G-tube insertion site to remove any drainage or crusting at the insertion site. Stomach residuals Feeding tube residuals are the amount of liquids that are in the stomach at any given time. Residuals may be checked before giving feedings, medications, or as instructed by your health care provider.  Ask your health care provider if there are instances when you would not start tube feedings depending on the amount or type of contents withdrawn from the stomach.  Check residuals by attaching a syringe to the G-tube and pulling back on the syringe plunger. Note the amount, and return the residual back into the stomach. Flushing the G-tube  The G-tube should be periodically  flushed with clean warm water to keep it from clogging.  Flush the G-tube after feedings or medications. Draw up 30 mL of warm water in a syringe. Connect the syringe to the G-tube and slowly push the water into the tube.  Do not push feedings, medications, or flushes rapidly. Flush the G-tube gently and slowly.  Only use syringes made for G-tubes to flush medications or feedings.  Your health care provider may want the G-tube flushed more often or with more water. If this is the case, follow your health care provider's instructions. Feedings Your health care provider will determine whether feedings are given as a bolus (a certain amount given at one time and at scheduled times) or whether feedings will be given continuously on a feeding pump.  Formulas should be given at room temperature.  If feedings are continuous, no more than 4 hours worth of feedings should be placed in the feeding bag. This helps prevent spoilage or accidental excess infusion.  Cover and place unused formula in the refrigerator.  If feedings are continuous, stop the feedings when medications or flushes are given. Be sure to restart the feedings.  Feeding bags and syringes should be replaced as instructed by your health care provider. Giving medication  In general, it is best if all medications are in a liquid form for G-tube administration. Liquid medications are less likely to clog the G-tube.  Mix the liquid medication with 30 mL (or amount recommended by your health  care provider) of warm water.  Draw up the medication into the syringe.  Attach the syringe to the G-tube and slowly push the mixture into the G-tube.  After giving the medication, draw up 30 mL of warm water in the syringe and slowly flush the G-tube.  For pills or capsules, check with your health care provider first before crushing medications. Some pills are not effective if they are crushed. Some capsules are sustained-release  medications.  If appropriate, crush the pill or capsule and mix with 30 mL of warm water. Using the syringe, slowly push the medication through the tube, then flush the tube with another 30 mL of tap water. G-tube problems G-tube was pulled out.  Cause: May have been pulled out accidentally.  Solutions: Cover the opening with clean dressing and tape. Call your health care provider right away. The G-tube should be put in as soon as possible (within 4 hours) so the G-tube opening (tract) does not close. The G-tube needs to be put in at a health care setting. An X-ray needs to be done to confirm placement before the G-tube can be used again. Redness, irritation, soreness, or foul odor around the gastrostomy site.  Cause: May be caused by leakage or infection.  Solutions: Call your health care provider right away. Large amount of leakage of fluid or mucus-like liquid present (a large amount means it soaks clothing).  Cause: Many reasons could cause the G-tube to [place].  Solutions: Call your health care provider to discuss the amount of leakage. Skin or scar tissue appears to be growing where tube enters skin.  Cause: Tissue growth may develop around the insertion site if the G-tube is moved or pulled on excessively.  Solutions: Secure tube with tape so that excess movement does not occur. Call your health care provider. G-tube is clogged.  Cause: Thick formula or medication.  Solutions: Try to slowly push warm water into the tube with a large syringe. Never try to push any object into the tube to unclog it. Do not force fluid into the G-tube. If you are unable to unclog the tube, call your health care provider right away. Tips  Head of bed (HOB) position refers to the upright position of a person's upper body.  When giving medications or a feeding bolus, keep the [facility] up as told by your health care provider. Do this during the feeding and for 1 hour after the feeding or medication  administration.  If continuous feedings are being given, it is best to keep the [facility] up as told by your health care provider. When ADLs (activities of daily living) are performed and the [facility] needs to be flat, be sure to turn the feeding pump off. Restart the feeding pump when the [facility] is returned to the recommended height.  Do not pull or put tension on the tube.  To prevent fluid backflow, kink the G-tube before removing the cap or disconnecting a syringe.  Check the G-tube length every day. Measure from the insertion site to the end of the G-tube. If the length is longer than previous measurements, the tube may be coming out. Call your health care provider if you notice increasing G-tube length.  Oral care, such as brushing teeth, must be continued.  You may need to remove excess air (vent) from the G-tube. Your health care provider will tell you if this is needed.  Always call your health care provider if you have questions or problems with the G-tube. Get help  right away if:  You have severe abdominal pain, tenderness, or abdominal bloating (distension).  You have nausea or vomiting.  You are constipated or have problems moving your bowels.  The G-tube insertion site is red, swollen, has a foul smell, or has yellow or brown drainage.  You have difficulty breathing or shortness of breath.  You have a fever.  You have a large amount of feeding tube residuals.  The G-tube is clogged and cannot be flushed. This information is not intended to replace advice given to you by your health care provider. Make sure you discuss any questions you have with your health care provider. Document Released: 02/24/2002 Document Revised: 05/23/2016 Document Reviewed: 08/23/2013 Elsevier Interactive Patient Education  2017 [place]. Moderate Conscious Sedation, Adult, Care After These instructions provide you with information about caring for yourself after your procedure. Your health care  provider may also give you more specific instructions. Your treatment has been planned according to current medical practices, but problems sometimes occur. Call your health care provider if you have any problems or questions after your procedure. What can I expect after the procedure? After your procedure, it is common:  To feel sleepy for several hours.  To feel clumsy and have poor balance for several hours.  To have poor judgment for several hours.  To vomit if you eat too soon. Follow these instructions at home: For at least 24 hours after the procedure:   Do not:  Participate in activities where you could fall or become injured.  Drive.  Use heavy machinery.  Drink alcohol.  Take sleeping pills or medicines that cause drowsiness.  Make important decisions or sign legal documents.  Take care of children on your own.  Rest. Eating and drinking  Follow the diet recommended by your health care provider.  If you vomit:  Drink water, juice, or soup when you can drink without vomiting.  Make sure you have little or no nausea before eating solid foods. General instructions  Have a responsible adult stay with you until you are awake and alert.  Take over-the-counter and prescription medicines only as told by your health care provider.  If you smoke, do not smoke without supervision.  Keep all follow-up visits as told by your health care provider. This is important. Contact a health care provider if:  You keep feeling nauseous or you keep vomiting.  You feel light-headed.  You develop a rash.  You have a fever. Get help right away if:  You have trouble breathing. This information is not intended to replace advice given to you by your health care provider. Make sure you discuss any questions you have with your health care provider. Document Released: 10/06/2013 Document Revised: 05/20/2016 Document Reviewed: 04/06/2016 Elsevier Interactive Patient Education   2017 [employer].

## 2016-12-26 NOTE — Progress Notes (Signed)
Patient is experiencing pain. Oral pain medications were ordered. Patient perfers pain medications in a liquid form. Order for hycet entered.

## 2016-12-26 NOTE — Sedation Documentation (Signed)
Patient is resting comfortably. 

## 2016-12-27 ENCOUNTER — Other Ambulatory Visit: Payer: Self-pay | Admitting: *Deleted

## 2016-12-27 ENCOUNTER — Ambulatory Visit: Payer: Medicare Other

## 2016-12-27 ENCOUNTER — Ambulatory Visit: Payer: Medicare Other | Admitting: Nutrition

## 2016-12-27 ENCOUNTER — Ambulatory Visit: Payer: Medicare Other | Admitting: Radiation Oncology

## 2016-12-27 ENCOUNTER — Ambulatory Visit
Admission: RE | Admit: 2016-12-27 | Discharge: 2016-12-27 | Disposition: A | Discharge status: Home / Self Care | Payer: Medicare Other | Source: Ambulatory Visit | Attending: Radiation Oncology | Admitting: Radiation Oncology

## 2016-12-27 ENCOUNTER — Ambulatory Visit (HOSPITAL_BASED_OUTPATIENT_CLINIC_OR_DEPARTMENT_OTHER): Payer: Medicare Other

## 2016-12-27 ENCOUNTER — Encounter: Payer: Self-pay | Admitting: Radiation Oncology

## 2016-12-27 VITALS — BP 123/85 | HR 90 | Temp 97.5°F | Resp 18 | Wt 71.6 lb

## 2016-12-27 DIAGNOSIS — C154 Malignant neoplasm of middle third of esophagus: Secondary | ICD-10-CM

## 2016-12-27 DIAGNOSIS — Z510 Encounter for antineoplastic radiation therapy: Secondary | ICD-10-CM | POA: Diagnosis not present

## 2016-12-27 LAB — COMPREHENSIVE METABOLIC PANEL
ALT: 26 U/L (ref 0–55)
AST: 25 U/L (ref 5–34)
Albumin: 3.3 g/dL — ABNORMAL LOW (ref 3.5–5.0)
Alkaline Phosphatase: 82 U/L (ref 40–150)
Anion Gap: 10 mEq/L (ref 3–11)
BUN: 12.9 mg/dL (ref 7.0–26.0)
CHLORIDE: 99 meq/L (ref 98–109)
CO2: 32 meq/L — AB (ref 22–29)
CREATININE: 0.6 mg/dL (ref 0.6–1.1)
Calcium: 9.2 mg/dL (ref 8.4–10.4)
EGFR: >90 mL/min/{1.73_m2} (ref >90)
GLUCOSE: 158 mg/dL — AB (ref 70–140)
Potassium: 2.9 mEq/L — CL (ref 3.5–5.1)
SODIUM: 142 meq/L (ref 136–145)
Total Bilirubin: 0.58 mg/dL (ref 0.20–1.20)
Total Protein: 6 g/dL — ABNORMAL LOW (ref 6.4–8.3)

## 2016-12-27 LAB — MAGNESIUM: Magnesium: 2 mg/dl (ref 1.5–2.5)

## 2016-12-27 MED ORDER — POTASSIUM CHLORIDE 40 MEQ/15ML (20%) PO SOLN: 20.0000 meq | Freq: Two times a day (BID) | ORAL | 0 refills | Status: DC

## 2016-12-27 MED ORDER — BIAFINE EX EMUL
Freq: Two times a day (BID) | CUTANEOUS | Status: DC
  Administered 2016-12-27: 09:00:00 via TOPICAL

## 2016-12-27 MED ORDER — OSMOLITE 1.5 CAL PO LIQD: ORAL | 0 refills | Status: DC

## 2016-12-27 NOTE — Progress Notes (Addendum)
Weekly rad txs esophagus, difficulty swallowing, at 2 am ate p nut butter cracker and ginger ale  2 oz, , at 530 am   3 nibbles nutra grain, with 3 sips coffee, patient only been taking  carafte 1-2 x this past week ,throws up after taking, no appetite, c/o chills, no fever, room air sats good 99%,  Tongue pink and moist, chest pink   And back rash clearing  Gave another sonafine cream using bid, sees Barbar neff at 945 this am  patient had peg tube placed yesterday 9:15 AM BP 123/85 (BP Location: Right Arm, Patient Position: Sitting, Cuff Size: Small)   Pulse 90   Temp 97.5 F (36.4 C) (Oral)   Resp 18   Wt 71 lb 9.6 oz (32.5 kg)   SpO2 99% Comment: room air  BMI 13.53 kg/m   Wt Readings from Last 3 Encounters:  12/27/16 71 lb 9.6 oz (32.5 kg)  12/26/16 70 lb 9.6 oz (32 kg)  12/24/16 72 lb 3.2 oz (32.7 kg)

## 2016-12-27 NOTE — Telephone Encounter (Signed)
Patient notified per order of Dr. Benay Spice that prescription for liquid potassium has been sent to [facility] in [place] for her to take twice a day via g tube d/t K-2.9 today.  Patient states that she met with dietician today and feels comfortable with tube feedings and administering medications via tube.  Patient has no questions at this time.

## 2016-12-27 NOTE — Progress Notes (Signed)
Patient is status post feeding tube placement yesterday secondary to esophageal cancer. Current weight documented as 71 pounds. Patient has gradually decreased oral intake secondary to dysphasia. Labs today include potassium 2.9, magnesium 2.0, glucose 158.  Phosphorus is pending.  Nutrition diagnosis: Malnutrition continues.  Intervention: Patient was educated to begin one bottle Osmolite 1.5 4 times a day with 30 cc free water before and after bolus feeding. In addition patient encouraged to drink or flush feeding tube with 240 cc of free water twice a day. Patient will increase to one bottle Osmolite 1.5 4 times a day as tolerated. Patient demonstrated bolus tube feeding and teach back method was used. Written instructions were provided. [facility] notified of new orders. Patient will have labs drawn to monitor for refeeding syndrome.  Estimated nutrition needs: 1200-1400 calories, 48-60 grams protein, 1.4 L fluid.  Monitoring, evaluation, goals:  Patient will tolerate bolus tube feeding advancement to promote healing and weight gain without showing signs of refeeding syndrome.  Next visit: Tuesday, January 2.  **Disclaimer: This note was dictated with voice recognition software. Similar sounding words can inadvertently be transcribed and this note may contain transcription errors which may not have been corrected upon publication of note.**

## 2016-12-27 NOTE — Progress Notes (Addendum)
Call from dietician that patient needs baseline labs today due to new tube feeding to begin today. Orders entered and notified radiation oncology tech on [employer] to inform patient she sees dietician and then labs today after seeing Dr. Lisbeth Renshaw. Labs added to 12/31/16 visit as well. High priority LOS entered for scheduling.

## 2016-12-27 NOTE — Progress Notes (Signed)
Department of Radiation Oncology  Phone:  [phone] Fax:        [phone]  Weekly Treatment Note    Name: Karen Osborn Date: 12/27/2016 MRN: [id number] DOB: [birth year]   Diagnosis:     ICD-9-CM ICD-10-CM   1. 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 acetaminophen (TYLENOL) 500 MG tablet Take 500 mg by mouth every 6 (six) hours as needed.    Marland Kitchen albuterol (PROAIR HFA) 108 (90 Base) MCG/ACT inhaler 2 puffs every 4 hours as needed only  if your can't catch your breath 1 Inhaler 11  . docusate sodium (COLACE) 100 MG capsule Take 200 mg by mouth daily.    . magnesium hydroxide (PHILLIPS MILK OF MAGNESIA) 400 MG/5ML suspension Take 30 mLs by mouth daily as needed for mild constipation.    . mineral oil (FLEET MINERAL OIL) enema Place 1 enema rectally once.    . polyethylene glycol (MIRALAX / GLYCOLAX) packet Take 17 g by mouth daily as needed for moderate constipation.    . prochlorperazine (COMPAZINE) 5 MG tablet Take 1 tablet (5 mg total) by mouth every 6 (six) hours as needed for nausea or vomiting. 60 tablet 1  . SPIRIVA RESPIMAT 2.5 MCG/ACT AERS INL 2 PFS INTO THE LUNGS D  5  . sucralfate (CARAFATE) 1 g tablet Take 1 tablet (1 g total) by mouth 4 (four) times daily. 120 tablet 2  . Wound Dressings (SONAFINE EX) Apply 1 application topically daily.    . Nutritional Supplements (FEEDING SUPPLEMENT, OSMOLITE 1.5 CAL,) LIQD Begin 1/2 bottle Osmolite 1.5 via PEG 4 times daily with 30 cc free water before and after bolus feeding. Increase to 1 bottle Osmolite 1.5 4 times daily as tolerated. Give 240 cc free water 2 times daily via PEG. 4 Bottle 0  . Potassium Chloride 40 MEQ/15ML (20%) SOLN Take 20 mEq by mouth 2 (two) times daily. Via g tube 100 mL 0   Current Facility-Administered  Medications  Medication Dose Route Frequency Provider Last Rate Last Dose  . topical emolient (BIAFINE) emulsion   Topical BID Hayden Pedro, PA-C         ALLERGIES: Benadryl [diphenhydramine hcl] and Codeine   LABORATORY DATA:  Lab Results  Component Value Date   WBC 2.4 (L) 12/26/2016   HGB 12.9 12/26/2016   HCT 38.1 12/26/2016   MCV 91.8 12/26/2016   PLT 158 12/26/2016   Lab Results  Component Value Date   NA 142 12/27/2016   K 2.9 (LL) 12/27/2016   CL 96 (L) 12/01/2016   CO2 32 (H) 12/27/2016   Lab Results  Component Value Date   ALT 26 12/27/2016   AST 25 12/27/2016   ALKPHOS 82 12/27/2016   BILITOT 0.58 12/27/2016     NARRATIVE: Karen Osborn was seen today for weekly treatment management. The chart was checked and the patient's films were reviewed.  She returns for weekly radiation treatment to the esophagus. The patient reports difficulty swallowing, and has only taken Carafate x 1-2 this past week because she vomits after taking. She reports eating a peanut butter cracker at 2 am this morning and drinking 2 oz of ginger ale. At 530 am she had 3 small bites of nutragrain with 3 sips of coffee. The patient is scheduled ot meet with  Ernestene Kiel this morning. The patient had a feeding tube placed yesterday.  PHYSICAL EXAMINATION: weight is 71 lb 9.6 oz (32.5 kg). Her oral temperature is 97.5 F (36.4 C). Her blood pressure is 123/85 and her pulse is 90. Her respiration is 18 and oxygen saturation is 99%.  Alert and in no acute distress.  ASSESSMENT: The patient is doing satisfactorily with treatment. Weight stable.  PLAN: We will continue with the patient's radiation treatment as planned.    ------------------------------------------------  Jodelle Gross, MD, PhD  This document serves as a record of services personally performed by Kyung Rudd, MD. It was created on his behalf by Maryla Morrow, a trained medical scribe. The creation of this record is  based on the scribe's personal observations and the provider's statements to them. This document has been checked and approved by the attending provider.

## 2016-12-28 LAB — PHOSPHORUS: PHOSPHORUS: 3.3 mg/dL (ref 2.5–4.5)

## 2016-12-31 ENCOUNTER — Ambulatory Visit: Payer: Medicare Other

## 2016-12-31 ENCOUNTER — Ambulatory Visit (HOSPITAL_BASED_OUTPATIENT_CLINIC_OR_DEPARTMENT_OTHER): Payer: Medicare Other

## 2016-12-31 ENCOUNTER — Ambulatory Visit
Admission: RE | Admit: 2016-12-31 | Discharge: 2016-12-31 | Disposition: A | Discharge status: Home / Self Care | Payer: Medicare Other | Source: Ambulatory Visit | Attending: Radiation Oncology | Admitting: Radiation Oncology

## 2016-12-31 ENCOUNTER — Telehealth: Payer: Self-pay | Admitting: Nurse Practitioner

## 2016-12-31 ENCOUNTER — Ambulatory Visit (HOSPITAL_BASED_OUTPATIENT_CLINIC_OR_DEPARTMENT_OTHER): Payer: Medicare Other | Admitting: Nurse Practitioner

## 2016-12-31 ENCOUNTER — Encounter: Payer: Medicare Other | Admitting: Nutrition

## 2016-12-31 ENCOUNTER — Other Ambulatory Visit (HOSPITAL_BASED_OUTPATIENT_CLINIC_OR_DEPARTMENT_OTHER): Payer: Medicare Other

## 2016-12-31 VITALS — BP 135/84 | HR 84 | Temp 97.6°F | Resp 16 | Ht 61.0 in | Wt 71.8 lb

## 2016-12-31 DIAGNOSIS — J449 Chronic obstructive pulmonary disease, unspecified: Secondary | ICD-10-CM | POA: Diagnosis not present

## 2016-12-31 DIAGNOSIS — E876 Hypokalemia: Secondary | ICD-10-CM

## 2016-12-31 DIAGNOSIS — Z5111 Encounter for antineoplastic chemotherapy: Secondary | ICD-10-CM | POA: Diagnosis not present

## 2016-12-31 DIAGNOSIS — C154 Malignant neoplasm of middle third of esophagus: Secondary | ICD-10-CM | POA: Diagnosis not present

## 2016-12-31 DIAGNOSIS — Z86000 Personal history of in-situ neoplasm of breast: Secondary | ICD-10-CM

## 2016-12-31 LAB — CBC WITH DIFFERENTIAL/PLATELET
BASO%: 0.9 % (ref 0.0–2.0)
BASOS ABS: 0 10*3/uL (ref 0.0–0.1)
EOS ABS: 0.1 10*3/uL (ref 0.0–0.5)
EOS%: 2.3 % (ref 0.0–7.0)
HCT: 38.6 % (ref 34.8–46.6)
HGB: 13 g/dL (ref 11.6–15.9)
LYMPH%: 5.3 % — AB (ref 14.0–49.7)
MCH: 31.4 pg (ref 25.1–34.0)
MCHC: 33.7 g/dL (ref 31.5–36.0)
MCV: 93 fL (ref 79.5–101.0)
MONO#: 0.5 10*3/uL (ref 0.1–0.9)
MONO%: 13.8 % (ref 0.0–14.0)
NEUT#: 2.8 10*3/uL (ref 1.5–6.5)
NEUT%: 77.7 % — AB (ref 38.4–76.8)
Platelets: 199 10*3/uL (ref 145–400)
RBC: 4.15 10*6/uL (ref 3.70–5.45)
RDW: 14.2 % (ref 11.2–14.5)
WBC: 3.6 10*3/uL — ABNORMAL LOW (ref 3.9–10.3)
lymph#: 0.2 10*3/uL — ABNORMAL LOW (ref 0.9–3.3)

## 2016-12-31 LAB — COMPREHENSIVE METABOLIC PANEL
ALT: 21 U/L (ref 0–55)
ANION GAP: 9 meq/L (ref 3–11)
AST: 17 U/L (ref 5–34)
Albumin: 3.1 g/dL — ABNORMAL LOW (ref 3.5–5.0)
Alkaline Phosphatase: 75 U/L (ref 40–150)
BILIRUBIN TOTAL: 0.3 mg/dL (ref 0.20–1.20)
BUN: 8.2 mg/dL (ref 7.0–26.0)
CALCIUM: 9.1 mg/dL (ref 8.4–10.4)
CO2: 32 mEq/L — ABNORMAL HIGH (ref 22–29)
Chloride: 99 mEq/L (ref 98–109)
Creatinine: 0.5 mg/dL — ABNORMAL LOW (ref 0.6–1.1)
EGFR: >90 mL/min/{1.73_m2} (ref >90)
Glucose: 120 mg/dl (ref 70–140)
Potassium: 3.2 mEq/L — ABNORMAL LOW (ref 3.5–5.1)
SODIUM: 140 meq/L (ref 136–145)
TOTAL PROTEIN: 5.9 g/dL — AB (ref 6.4–8.3)

## 2016-12-31 LAB — MAGNESIUM: MAGNESIUM: 2 mg/dL (ref 1.5–2.5)

## 2016-12-31 MED ORDER — PACLITAXEL CHEMO INJECTION 300 MG/50ML
50.0000 mg/m2 | Freq: Once | INTRAVENOUS | Status: AC
  Administered 2016-12-31: 60 mg via INTRAVENOUS
  Filled 2016-12-31: qty 10

## 2016-12-31 MED ORDER — PALONOSETRON HCL INJECTION 0.25 MG/5ML
0.2500 mg | Freq: Once | INTRAVENOUS | Status: AC
  Administered 2016-12-31: 0.25 mg via INTRAVENOUS

## 2016-12-31 MED ORDER — POTASSIUM CHLORIDE 40 MEQ/15ML (20%) PO SOLN: 20.0000 meq | Freq: Every day | ORAL | 0 refills | Status: DC

## 2016-12-31 MED ORDER — DEXAMETHASONE SODIUM PHOSPHATE 10 MG/ML IJ SOLN
10.0000 mg | Freq: Once | INTRAMUSCULAR | Status: AC
  Administered 2016-12-31: 10 mg via INTRAVENOUS

## 2016-12-31 MED ORDER — CARBOPLATIN CHEMO INJECTION 450 MG/45ML
101.0000 mg | Freq: Once | INTRAVENOUS | Status: AC
  Administered 2016-12-31: 100 mg via INTRAVENOUS
  Filled 2016-12-31: qty 10

## 2016-12-31 MED ORDER — FAMOTIDINE IN NACL 20-0.9 MG/50ML-% IV SOLN
INTRAVENOUS | Status: AC
  Filled 2016-12-31: qty 50

## 2016-12-31 MED ORDER — PALONOSETRON HCL INJECTION 0.25 MG/5ML
INTRAVENOUS | Status: AC
  Filled 2016-12-31: qty 5

## 2016-12-31 MED ORDER — DEXAMETHASONE SODIUM PHOSPHATE 10 MG/ML IJ SOLN
INTRAMUSCULAR | Status: AC
  Filled 2016-12-31: qty 1

## 2016-12-31 MED ORDER — SODIUM CHLORIDE 0.9 % IV SOLN
Freq: Once | INTRAVENOUS | Status: AC
  Administered 2016-12-31: 12:00:00 via INTRAVENOUS

## 2016-12-31 MED ORDER — FAMOTIDINE IN NACL 20-0.9 MG/50ML-% IV SOLN
20.0000 mg | Freq: Once | INTRAVENOUS | Status: AC
  Administered 2016-12-31: 20 mg via INTRAVENOUS

## 2016-12-31 MED FILL — POTASSIUM CL 10% (20 MEQ/15: 20 MEQ/15ML | 4 days supply | Qty: 50 | Fill #0

## 2016-12-31 NOTE — Telephone Encounter (Signed)
Appointments scheduled per 1/2 LOS. Patient given AVS report and calendars with future scheduled appointments. °

## 2016-12-31 NOTE — Patient Instructions (Signed)
[facility] Discharge Instructions for Patients Receiving Chemotherapy  Today you received the following chemotherapy agents Taxol and Carboplatin  To help prevent nausea and vomiting after your treatment, we encourage you to take your nausea medication as directed. No Zofran for 3 days. Take Compazine instead.   If you develop nausea and vomiting that is not controlled by your nausea medication, call the clinic.   BELOW ARE SYMPTOMS THAT SHOULD BE REPORTED IMMEDIATELY:  *FEVER GREATER THAN 100.5 F  *CHILLS WITH OR WITHOUT FEVER  NAUSEA AND VOMITING THAT IS NOT CONTROLLED WITH YOUR NAUSEA MEDICATION  *UNUSUAL SHORTNESS OF BREATH  *UNUSUAL BRUISING OR BLEEDING  TENDERNESS IN MOUTH AND THROAT WITH OR WITHOUT PRESENCE OF ULCERS  *URINARY PROBLEMS  *BOWEL PROBLEMS  UNUSUAL RASH Items with * indicate a potential emergency and should be followed up as soon as possible.  Feel free to call the clinic you have any questions or concerns. The clinic phone number is [phone].  Please show the CHEMO ALERT CARD at check-in to the Emergency Department and triage nurse.   

## 2016-12-31 NOTE — Progress Notes (Signed)
Called in liquid potassium to [place], [facility]. Pt aware

## 2016-12-31 NOTE — Progress Notes (Signed)
  [place] OFFICE PROGRESS NOTE   Diagnosis:  Esophagus cancer  INTERVAL HISTORY:   Karen Osborn returns as scheduled. She continues radiation. She completed week 4 Taxol/carboplatin 12/17/2016. Chemotherapy was held on 12/24/2016. She underwent placement of a feeding tube 12/26/2016. Tube feedings were initiated 12/27/2016. She reports she is feeling better. She notes less dysphagia and odynophagia. She reports she is tolerating the tube feedings well. She continues to note lower extremity edema.  Objective:  Vital signs in last 24 hours:  Blood pressure 135/84, pulse 84, temperature 97.6 F (36.4 C), temperature source Oral, resp. rate 16, height 5\' 1"  (1.549 m), weight 71 lb 12.8 oz (32.6 kg), SpO2 95 %.    HEENT: No thrush or ulcers. Resp: Distant breath sounds. Cardio: Regular rate and rhythm. GI: Abdomen soft and nontender. No organomegaly. Left abdomen feeding tube. Vascular: Pitting edema at the ankles and feet bilaterally. Skin: Center of mid back with area of erythema.   Lab Results:  Lab Results  Component Value Date   WBC 3.6 (L) 12/31/2016   HGB 13.0 12/31/2016   HCT 38.6 12/31/2016   MCV 93.0 12/31/2016   PLT 199 12/31/2016   NEUTROABS 2.8 12/31/2016    Imaging:  No results found.  Medications: I have reviewed the patient's current medications.  Assessment/Plan: 1. Squamous cell carcinoma the midesophagus ? Staging CT scans 10/29/2016 with no evidence of metastatic disease; nonspecific liver lesions, right hydronephrosis, retroperitoneal lymph nodes, and sclerotic right seventh rib lesion ? Staging PET scan 11/13/2016-low left jugular/supraclavicular node measuring 5 mm and SUV max of 2.6; left paratracheal node measuring8 mm and SUV max 5.8; periaorticnodes including a 6 mm node SUV max 4.4; right retrocrural hypermetabolic node measures 8 mm and SUV max of 9.4;hypermetabolism corresponding to the mid esophageal primary SUV max 15.6;  no right hepatic lobe hypermetabolism; no hypermetabolism to correspond to the retroperitoneal mild adenopathy on prior exam. ? Initiation of radiation 11/25/2016 and concurrent weekly Taxol/carboplatin 11/26/2016; week 5 Taxol/carboplatin 12/31/2016  2. COPD  3. 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 Right hydronephrosis on the CT 10/29/2016  5. Dysphagia/weight loss secondary to #1  6. Rash upper back 12/10/2016. Question etiology.  7.   Feeding tube placement 12/26/2016   Disposition: [name] appears stable. She continues radiation. She will complete the fifth and final weekly Taxol/carboplatin today. She is scheduled to complete the course of radiation 01/03/2017. She will return for a follow-up visit in 2 weeks. She will contact the office in the interim with any problems.  She has mild hypokalemia on labs today. We sent a prescription to her pharmacy for liquid potassium 20 mEq daily. We will repeat a basic metabolic panel when she returns in 2 weeks.  Plan reviewed with Dr. Benay Spice.  Ned Card ANP/GNP-BC   12/31/2016  11:11 AM

## 2017-01-01 ENCOUNTER — Ambulatory Visit
Admission: RE | Admit: 2017-01-01 | Discharge: 2017-01-01 | Disposition: A | Discharge status: Home / Self Care | Payer: Medicare Other | Source: Ambulatory Visit | Attending: Radiation Oncology | Admitting: Radiation Oncology

## 2017-01-01 ENCOUNTER — Ambulatory Visit: Payer: Medicare Other

## 2017-01-01 ENCOUNTER — Telehealth: Payer: Self-pay | Admitting: *Deleted

## 2017-01-01 LAB — PHOSPHORUS: Phosphorus, Ser: 3.3 mg/dL (ref 2.5–4.5)

## 2017-01-01 NOTE — Telephone Encounter (Signed)
  Oncology Nurse Navigator Documentation  Navigator Location: [facility] (01/01/17 1506)   )Navigator Encounter Type: Telephone (01/01/17 1506) Telephone: [place] Call (01/01/17 1506)   Called to follow up on how her tube feedings are going. She reports getting 3-4 cans/day in with the free water flushes as well. She thinks it is going well at this time.

## 2017-01-02 ENCOUNTER — Encounter: Payer: Self-pay | Admitting: Radiation Oncology

## 2017-01-02 ENCOUNTER — Ambulatory Visit
Admission: RE | Admit: 2017-01-02 | Discharge: 2017-01-02 | Disposition: A | Discharge status: Home / Self Care | Payer: Medicare Other | Source: Ambulatory Visit | Attending: Radiation Oncology | Admitting: Radiation Oncology

## 2017-01-02 ENCOUNTER — Telehealth: Payer: Self-pay | Admitting: *Deleted

## 2017-01-02 NOTE — Progress Notes (Signed)
  Radiation Oncology         [phone] ________________________________  Name: Karen Osborn MRN: [id number]  Date: 12/04/2016  DOB: December 01, 1941  COMPLEX SIMULATION  NOTE  Diagnosis: Esophageal cancer  Narrative The patient has initially been planned to receive a course of radiation treatment to a dose of 45 gray in 25 fractions at 1.8 gray per fraction. The patient will now receive a boost to the high risk target volume for an additional 5.4 gray. This will be delivered in 3 fractions at 1.8 gray per fraction and a cone down boost technique will be utilized. To accomplish this, an additional IMRT boost plan has been requested. This is necessary given the extent and location of the patient's extensive disease within the chest and its continued proximity in terms of the high dose region to critical normal structures including the lungs, spinal cord, and heart..  The patient's final total dose therefore will be 50.4 gray.   ________________________________ ------------------------------------------------  Jodelle Gross, MD, PhD

## 2017-01-02 NOTE — Progress Notes (Addendum)
Weekly rad txs esophagus, difficulty swallowing at times when her mouth is dry,  taking  carafte 1-2 x  A day not throwing up as much after taking the carafate,  Appetite as increased, c/o chills says she is cold natured, no fever, room air sats good 97%,  Tongue pink and moist, chest pink  and back rash clearing skin dry.  Using sonafine cream using bid.   one bottle Osmolite 1.5 4 times a day with 30 cc free water before and after bolus feeding. In addition patient encouraged to drink or flush feeding tube with 240 cc of free water twice a day. Patient will increase to one bottle Osmolite 1.5 4 times a day as tolerated Estimated nutrition needs: 1200-1400 calories, 48-60 grams protein, 1.4 L fluid. EOT instructions given for skin care continue to use sonafine bid to chest and back, when you are out of the sonafine use a lotion with vitamin E.   Asked to continue tube feeding Osmolite per instructions from nutritionist one month follow up card given to see Shona Simpson, PA-C.  Has gained two pounds Wt Readings from Last 3 Encounters:  01/02/17 73 lb 9.6 oz (33.4 kg)  12/31/16 71 lb 12.8 oz (32.6 kg)  12/27/16 71 lb 9.6 oz (32.5 kg)  BP 91/63   Pulse (!) 101   Temp 97.5 F (36.4 C) (Oral)   Resp 18   Ht 5\' 1"  (1.549 m)   Wt 73 lb 9.6 oz (33.4 kg)   SpO2 97%   BMI 13.91 kg/m sitting right arm B/P 90/*66 101 930 am standing right arm

## 2017-01-02 NOTE — Telephone Encounter (Signed)
CALLED PATIENT TO INFORM OF GENETICS APPT. WITH KAREN POWELL ON 03-10-17 - ARRIVAL TIME - 12:30 PM, LVM FOR A RETURN CALL

## 2017-01-02 NOTE — Progress Notes (Addendum)
  Radiation Oncology         [phone] ________________________________  Name: Karen Osborn MRN: [id number]  Date: 11/04/2016  DOB: 02/03/1941  SIMULATION AND TREATMENT PLANNING NOTE  DIAGNOSIS:     ICD-9-CM ICD-10-CM   1. Malignant neoplasm of middle third of esophagus ([place]) 150.4 C15.4      Site:  Esophagus  NARRATIVE:  The patient was brought to the [place].  Identity was confirmed.  All relevant records and images related to the planned course of therapy were reviewed.   Written consent to proceed with treatment was confirmed which was freely given after reviewing the details related to the planned course of therapy had been reviewed with the patient.  Then, the patient was set-up in a stable reproducible  supine position for radiation therapy.  CT images were obtained.  Surface markings were placed.    Medically necessary complex treatment device(s) for immobilization:  Customized vac lock bag.   The CT images were loaded into the planning software.  Then the target and avoidance structures were contoured.  Treatment planning then occurred.  The radiation prescription was entered and confirmed.  I have requested : Intensity Modulated Radiotherapy (IMRT) is medically necessary for this case for the following reason:  Avoidance of critical normal structures including the heart, spinal cord and lungs. The patient has also received previous radiation treatment for breast cancer in this region.  The patient will undergo daily image guidance to ensure accurate localization of the target, and adequate minimize dose to the normal surrounding structures in close proximity to the target. Marland Kitchen   PLAN:  The patient will receive 45 Gy in 25 fractions initially. The patient will then receive it is anticipated a 5.4 gray boost to yield a total dose of 50.4 gray.   Special treatment procedure The patient will also receive concurrent chemotherapy during the treatment. The  patient may therefore experience increased toxicity or side effects and the patient will be monitored for such problems. This may require extra lab work as necessary. This therefore constitutes a special treatment procedure.  ________________________________   Jodelle Gross, MD, PhD

## 2017-01-03 ENCOUNTER — Other Ambulatory Visit: Payer: Self-pay

## 2017-01-03 ENCOUNTER — Ambulatory Visit
Admission: RE | Admit: 2017-01-03 | Discharge: 2017-01-03 | Disposition: A | Discharge status: Home / Self Care | Payer: Medicare Other | Source: Ambulatory Visit | Attending: Radiation Oncology | Admitting: Radiation Oncology

## 2017-01-03 DIAGNOSIS — C154 Malignant neoplasm of middle third of esophagus: Secondary | ICD-10-CM

## 2017-01-03 MED ORDER — POTASSIUM CHLORIDE 40 MEQ/15ML (20%) PO SOLN: 20.0000 meq | Freq: Every day | ORAL | 0 refills | Status: DC

## 2017-01-03 MED FILL — POTASSIUM CL 10% (20 MEQ/15: 20 MEQ/15ML | 14 days supply | Qty: 210 | Fill #0

## 2017-01-03 NOTE — Telephone Encounter (Signed)
Patients husband came in to office, states he read the potassium bottle wrong and pulled up 8ml of potassium and then discarded it because he saw it was supposed to be 81ml. Pt husband worried he doesn't have enough now. Reordered 2 week supply of potassium and educated pt husband on dosage. Pt husband verbalized understanding.

## 2017-01-08 ENCOUNTER — Encounter: Payer: Self-pay | Admitting: Radiation Oncology

## 2017-01-08 NOTE — Progress Notes (Signed)
°  Radiation Oncology         [phone] ________________________________  Name: Karen Osborn MRN: [id number]  Date: 01/08/2017  DOB: 11-Mar-1941  End of Treatment Note  Diagnosis:   Malignant neoplasm of middle third of esophagus     Indication for treatment:  Curative       Radiation treatment dates:   11/25/16 - 01/03/17  Site/dose:   Esophagus treated to 45 Gy in 25 fractions of 1.8 Gy. The Esophagus was then boosted to 50.4 Gy in 3 fractions of 1.8 Gy.  Beams/energy:   IMRT  //  6X  Narrative: The patient tolerated radiation treatment relatively well. The patient experienced difficulty swallowing, which was not usually relieved with Carafate because it induced vomiting. She had a feeding tube placed during treatment.  Plan: The patient has completed radiation treatment. The patient will return to radiation oncology clinic for routine followup in one month. I advised them to call or return sooner if they have any questions or concerns related to their recovery or treatment.  ------------------------------------------------  Jodelle Gross, MD, PhD  This document serves as a record of services personally performed by Kyung Rudd, MD. It was created on his behalf by Maryla Morrow, a trained medical scribe. The creation of this record is based on the scribe's personal observations and the provider's statements to them. This document has been checked and approved by the attending provider.

## 2017-01-14 ENCOUNTER — Ambulatory Visit (HOSPITAL_BASED_OUTPATIENT_CLINIC_OR_DEPARTMENT_OTHER): Payer: Medicare Other | Admitting: Oncology

## 2017-01-14 ENCOUNTER — Other Ambulatory Visit (HOSPITAL_BASED_OUTPATIENT_CLINIC_OR_DEPARTMENT_OTHER): Payer: Medicare Other

## 2017-01-14 ENCOUNTER — Telehealth: Payer: Self-pay | Admitting: Oncology

## 2017-01-14 ENCOUNTER — Ambulatory Visit: Payer: Medicare Other | Admitting: Nutrition

## 2017-01-14 VITALS — BP 98/65 | HR 108 | Temp 98.0°F | Resp 17 | Ht 61.0 in | Wt <= 1120 oz

## 2017-01-14 DIAGNOSIS — R1310 Dysphagia, unspecified: Secondary | ICD-10-CM | POA: Diagnosis not present

## 2017-01-14 DIAGNOSIS — J449 Chronic obstructive pulmonary disease, unspecified: Secondary | ICD-10-CM | POA: Diagnosis not present

## 2017-01-14 DIAGNOSIS — C154 Malignant neoplasm of middle third of esophagus: Secondary | ICD-10-CM

## 2017-01-14 LAB — CBC WITH DIFFERENTIAL/PLATELET
BASO%: 0.7 % (ref 0.0–2.0)
Basophils Absolute: 0 10*3/uL (ref 0.0–0.1)
EOS ABS: 0.1 10*3/uL (ref 0.0–0.5)
EOS%: 3.5 % (ref 0.0–7.0)
HCT: 39.6 % (ref 34.8–46.6)
HGB: 13.1 g/dL (ref 11.6–15.9)
LYMPH#: 0.1 10*3/uL — AB (ref 0.9–3.3)
LYMPH%: 4.9 % — ABNORMAL LOW (ref 14.0–49.7)
MCH: 31.3 pg (ref 25.1–34.0)
MCHC: 33.1 g/dL (ref 31.5–36.0)
MCV: 94.7 fL (ref 79.5–101.0)
MONO#: 0.5 10*3/uL (ref 0.1–0.9)
MONO%: 18.5 % — ABNORMAL HIGH (ref 0.0–14.0)
NEUT%: 72.4 % (ref 38.4–76.8)
NEUTROS ABS: 2.1 10*3/uL (ref 1.5–6.5)
PLATELETS: 218 10*3/uL (ref 145–400)
RBC: 4.18 10*6/uL (ref 3.70–5.45)
RDW: 16.2 % — ABNORMAL HIGH (ref 11.2–14.5)
WBC: 2.9 10*3/uL — AB (ref 3.9–10.3)

## 2017-01-14 LAB — BASIC METABOLIC PANEL
Anion Gap: 9 mEq/L (ref 3–11)
BUN: 17.8 mg/dL (ref 7.0–26.0)
CHLORIDE: 96 meq/L — AB (ref 98–109)
CO2: 30 meq/L — AB (ref 22–29)
CREATININE: 0.6 mg/dL (ref 0.6–1.1)
Calcium: 9.5 mg/dL (ref 8.4–10.4)
EGFR: >90 mL/min/{1.73_m2} (ref >90)
GLUCOSE: 136 mg/dL (ref 70–140)
POTASSIUM: 3.9 meq/L (ref 3.5–5.1)
Sodium: 135 mEq/L — ABNORMAL LOW (ref 136–145)

## 2017-01-14 MED ORDER — MEGESTROL ACETATE 400 MG/10ML PO SUSP: 200.0000 mg | Freq: Two times a day (BID) | ORAL | 1 refills | Status: DC

## 2017-01-14 MED ORDER — OSMOLITE 1.5 CAL PO LIQD: ORAL | 0 refills | Status: DC

## 2017-01-14 MED FILL — MEGESTROL ACET 40 MG/ML SUS: 40 | 24 days supply | Qty: 240 | Fill #0

## 2017-01-14 NOTE — Progress Notes (Signed)
  [place] OFFICE PROGRESS NOTE   Diagnosis: Esophagus cancer  INTERVAL HISTORY:   Karen Osborn returns as scheduled. She complains of anorexia. The dysphagia has improved, but her appetite is very poor. She is not eating and drinks very little. She is using the feeding tube. She reports chest "tightness at the left lower anterior chest for the past several days. No associated symptoms.  Objective:  Vital signs in last 24 hours:  Blood pressure 98/65, pulse (!) 108, temperature 98 F (36.7 C), temperature source Oral, resp. rate 17, height 5\' 1"  (1.549 m), weight 68 lb 9.6 oz (31.1 kg), SpO2 97 %.    HEENT: No thrush Resp: Distant breath sounds, no respiratory distress Cardio: Regular rate and rhythm GI: No hepatomegaly Vascular: No leg edema Skin: Area of skin breakdown at the mid back with an overlying eschar     Lab Results:  Lab Results  Component Value Date   WBC 2.9 (L) 01/14/2017   HGB 13.1 01/14/2017   HCT 39.6 01/14/2017   MCV 94.7 01/14/2017   PLT 218 01/14/2017   NEUTROABS 2.1 01/14/2017   Potassium-3.9   Medications: I have reviewed the patient's current medications.  Assessment/Plan: 1. Squamous cell carcinoma the midesophagus ? Staging CT scans 10/29/2016 with no evidence of metastatic disease; nonspecific liver lesions, right hydronephrosis, retroperitoneal lymph nodes, and sclerotic right seventh rib lesion ? Staging PET scan 11/13/2016-low left jugular/supraclavicular node measuring 5 mm and SUV max of 2.6; left paratracheal node measuring8 mm and SUV max 5.8; periaorticnodes including a 6 mm node SUV max 4.4; right retrocrural hypermetabolic node measures 8 mm and SUV max of 9.4;hypermetabolism corresponding to the mid esophageal primary SUV max 15.6; no right hepatic lobe hypermetabolism; no hypermetabolism to correspond to the retroperitoneal mild adenopathy on prior exam. ? Initiation of radiation 11/25/2016 and concurrent weekly  Taxol/carboplatin 11/26/2016; week 5 Taxol/carboplatin 12/31/2016  2. COPD  3. 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 Right hydronephrosis on the CT 10/29/2016  5. Dysphagia/weight loss secondary to #1  6. Rash upper back 12/10/2016. Question etiology.  7.   Feeding tube placement 12/26/2016     Disposition:  Karen Osborn has completed treatment for esophagus cancer. Dysphagia has improved, but she continues to lose weight. She is losing weight despite 4 cans of nutrition supplement via the feeding tube daily. She has anorexia. She will begin an appetite stimulant. She will start Megace. We reviewed the increased risk of thrombosis associated with Megace.   We will arrange for an appointment with the [place] nutritionist today. I encouraged her to increase her fluid intake by mouth and via the tube.  Karen Osborn will return for an office visit on 01/23/2017.  The chest discomfort is most likely related to the esophagus tumor and radiation. She will try Maalox.  Betsy Coder, MD  01/14/2017  8:55 AM

## 2017-01-14 NOTE — Progress Notes (Signed)
Nutrition follow up completed with patient s/p treatment for esophageal cancer. Weight decreased and documented as 68.6 pounds, decreased from 73.6 pounds on Jan 4. Patient reports she is tolerating Osmolite 1.5 4 times a day with 60 cc free water before and 90 cc free water after each bolus feeding. She is not able to eat by mouth and reports nausea with food smells. Labs noted sodium 135, glucose 136 on January 16. Patient complains of weakness and fatigue. She is not putting additional free water in her feeding tube.  Nutrition diagnosis: Malnutrition continues.  Intervention: Educated patient to increase Osmolite 1.5 to one bottle 5 times a day with 90 cc free water before and after bolus feedings. Patient will get tube feeding every 3 hours. Tube feeding regimen was written for patient. Questions were answered.  Teach back method used.  Patient is agreeable to plan.  5 bottles Osmolite 1.5 will provide 1775 cal, 74.5 g protein, 1805 mL free water  Monitoring, evaluation, goals: Patient will tolerate tube feeding regimen and achieve weight gain.  Next visit: Scheduled with M.D. visit in 1-1/2 weeks.  **Disclaimer: This note was dictated with voice recognition software. Similar sounding words can inadvertently be transcribed and this note may contain transcription errors which may not have been corrected upon publication of note.**

## 2017-01-14 NOTE — Telephone Encounter (Signed)
Appointments scheduled per 1/16 LOS. Patient given AVS report and calendars with future scheduled appointments. °

## 2017-01-14 NOTE — Addendum Note (Signed)
Addended by: Rosalio Macadamia C on: 01/14/2017 11:30 AM   Modules accepted: Orders

## 2017-01-15 ENCOUNTER — Encounter: Payer: Self-pay | Admitting: *Deleted

## 2017-01-15 NOTE — Progress Notes (Signed)
Signed orders faxed to [facility] at [phone] for enteral therapy.

## 2017-01-17 ENCOUNTER — Telehealth: Payer: Self-pay | Admitting: *Deleted

## 2017-01-17 ENCOUNTER — Ambulatory Visit (HOSPITAL_COMMUNITY)
Admission: RE | Admit: 2017-01-17 | Discharge: 2017-01-17 | Disposition: A | Discharge status: Home / Self Care | Payer: Medicare Other | Source: Ambulatory Visit | Attending: Oncology | Admitting: Oncology

## 2017-01-17 ENCOUNTER — Telehealth (HOSPITAL_COMMUNITY): Payer: Self-pay | Admitting: *Deleted

## 2017-01-17 ENCOUNTER — Telehealth: Payer: Self-pay

## 2017-01-17 ENCOUNTER — Encounter (HOSPITAL_COMMUNITY): Payer: Self-pay | Admitting: *Deleted

## 2017-01-17 ENCOUNTER — Other Ambulatory Visit: Payer: Self-pay | Admitting: Oncology

## 2017-01-17 DIAGNOSIS — C154 Malignant neoplasm of middle third of esophagus: Secondary | ICD-10-CM

## 2017-01-17 HISTORY — PX: IR GENERIC HISTORICAL: IMG1180011

## 2017-01-17 MED ORDER — AMOXICILLIN 250 MG/5ML PO SUSR: 500.0000 mg | Freq: Two times a day (BID) | ORAL | 0 refills | Status: DC

## 2017-01-17 NOTE — Addendum Note (Signed)
Addended by: Janace Hoard on: 01/17/2017 11:04 AM   Modules accepted: Orders

## 2017-01-17 NOTE — Telephone Encounter (Signed)
Called pt's daughter to clarify: antibiotic was called in for UTI symptoms. If pt is having dysuria/ flank pain urgency or hesitancy- take the antibiotic.  She voiced understanding, will check in with pt tonight and clarify instructions.

## 2017-01-17 NOTE — Telephone Encounter (Signed)
Daughter called RN on call last night. Pt has been experiencing pain on urination for a few days. The on call RN called in septra liquid. The pharmacy was closed d/t weather. Amber will contact pharmacy to see if they are open today.  The symptoms are pain on urination, no blood in urine, no frequency, no fever. Her mother's tone of voice is miserable. No other symptoms or problems reported. Should they go ahead with the septra or come to [facility] today? Please call daughter Luetta Nutting [phone].

## 2017-01-17 NOTE — Telephone Encounter (Signed)
Amber called [facility] in [place] and they will not have septra liquid in until Monday. They were not willing to find if another [facility] has it. His insurance is making him use [facility]. The husband is adamant for a liquid antibiotic. Amber is willing to use whatever pharmacy is necessary.  S/w Dr Benay Spice and he said to select another antibiotic they do have in stock. The pt does not need to be seen at [facility] unless she feels it is necessary.   Amber stated her mom does not feel need to come to [facility]. Worked with [facility] and gave Rx for amoxicillin equivalent to the original Septra order. Which is 500 mg q 12 hours for 5 days.

## 2017-01-17 NOTE — Telephone Encounter (Signed)
Informed pt's daughter that megace script is ready for pick up at [facility]. She will make pt aware.

## 2017-01-17 NOTE — Procedures (Signed)
Patient came in with complaint of severe pain at G tube insertion site.  After evaluating the site, it was determined that the g tube retention bumper was too tight.  The bumper was retracted and repositioned with the final placement at marker level 3.  The patient was able to move and cough without the previous pain.  She was happy and said her pain issues were resolved.  She did inform me that Dr Benay Spice had called in a prescription for antibiotics anticipating the g tube was infected.  Keven Allred PAC evaluated the site and determined that it was not infected and advised the patient the antibiotics were not necessary.  The patient had no other concerns for myself or Lennette Bihari.  She was advised to call if she has any further issues.

## 2017-01-17 NOTE — Telephone Encounter (Signed)
"  This is [employer] calling for my mom.  My dad put the Boost and pink antibiotic in the g-tube.  She has yellow greenish drainage from the tube.  I told her to get dressed to be seen for evaluation." Asked if this is pouring out or small amount of drainage but Amber is at work.  Instructed to wipe area clean with water as drainage is normal.  Amber expressed concern of infection.  This nurse will call for further evaluation.  "My Dad is hard of hearing.  (On hold while Amber called.)  It's more than small amount and she has pain at the insertion site.  With the weekend, I know my parents and she needs this evaluated. "   Verbal order received and read back from Dr. Benay Spice for radiology evaluation for tube if patient wants to come in.  Per daughter Luetta Nutting this evaluation is wanted.  Spoke with [facility] with Interventional radiology.  Patient can come in at 3:00 to register for radiology evaluation at 3:30 pm.  Amber given this information.  Asked who mom would see, Dr. Benay Spice or radiation.  Neither will need to be seen.  Proceed to radiology at 3:00 pm for IR evaluation.  Orders placed.           This nurse called spouse to confirm she needs to go to the xray department at [facility] at 3:00 pm.  Verbalized understanding.

## 2017-01-20 ENCOUNTER — Telehealth: Payer: Self-pay | Admitting: Oncology

## 2017-01-20 NOTE — Telephone Encounter (Signed)
Per 1/16 sch msg add nutrition appointment after f/u with LT. Left message for BN asking if she would be able to see patient 1/25 after LT - no availability on BN's schedule.

## 2017-01-21 ENCOUNTER — Telehealth: Payer: Self-pay | Admitting: Oncology

## 2017-01-21 NOTE — Telephone Encounter (Signed)
Added appointment with BN after 1/25 f/u w/LT per schedule message. Per BN ok to add, however there may be a short delay with her getting to patient. Comment re delay added to appointment and message left for patient.

## 2017-01-23 ENCOUNTER — Ambulatory Visit: Payer: Medicare Other | Admitting: Nutrition

## 2017-01-23 ENCOUNTER — Ambulatory Visit (HOSPITAL_BASED_OUTPATIENT_CLINIC_OR_DEPARTMENT_OTHER): Payer: Medicare Other | Admitting: Nurse Practitioner

## 2017-01-23 ENCOUNTER — Telehealth: Payer: Self-pay | Admitting: Oncology

## 2017-01-23 VITALS — BP 113/67 | HR 109 | Temp 98.0°F | Resp 18 | Ht 61.0 in | Wt <= 1120 oz

## 2017-01-23 DIAGNOSIS — J449 Chronic obstructive pulmonary disease, unspecified: Secondary | ICD-10-CM | POA: Diagnosis not present

## 2017-01-23 DIAGNOSIS — R634 Abnormal weight loss: Secondary | ICD-10-CM

## 2017-01-23 DIAGNOSIS — C154 Malignant neoplasm of middle third of esophagus: Secondary | ICD-10-CM

## 2017-01-23 NOTE — Progress Notes (Signed)
  [facility] OFFICE PROGRESS NOTE   Diagnosis:  Esophagus cancer  INTERVAL HISTORY:   Karen Osborn returns as scheduled. Main complaint continues to be a poor energy level. She noted no improvement in appetite with Megace. She continues tube feedings. She is tolerating some solids. No nausea or vomiting. No constipation or diarrhea. No significant chest pain.  Objective:  Vital signs in last 24 hours:  Blood pressure 113/67, pulse (!) 109, temperature 98 F (36.7 C), temperature source Oral, resp. rate 18, height 5\' 1"  (1.549 m), weight 69 lb 4.8 oz (31.4 kg), SpO2 99 %.    HEENT: No thrush or ulcers. Resp: Distant breath sounds. No respiratory distress. Cardio: Regular rate and rhythm. GI: No hepatomegaly. No erythema at the feeding tube site. Vascular: No leg edema.    Lab Results:  Lab Results  Component Value Date   WBC 2.9 (L) 01/14/2017   HGB 13.1 01/14/2017   HCT 39.6 01/14/2017   MCV 94.7 01/14/2017   PLT 218 01/14/2017   NEUTROABS 2.1 01/14/2017    Imaging:  No results found.  Medications: I have reviewed the patient's current medications.  Assessment/Plan: 1. Squamous cell carcinoma the midesophagus ? Staging CT scans 10/29/2016 with no evidence of metastatic disease; nonspecific liver lesions, right hydronephrosis, retroperitoneal lymph nodes, and sclerotic right seventh rib lesion ? Staging PET scan 11/13/2016-low left jugular/supraclavicular node measuring 5 mm and SUV max of 2.6; left paratracheal node measuring8 mm and SUV max 5.8; periaorticnodes including a 6 mm node SUV max 4.4; right retrocrural hypermetabolic node measures 8 mm and SUV max of 9.4;hypermetabolism corresponding to the mid esophageal primary SUV max 15.6; no right hepatic lobe hypermetabolism; no hypermetabolism to correspond to the retroperitoneal mild adenopathy on prior exam. ? Initiation of radiation 11/25/2016 and concurrent weekly Taxol/carboplatin 11/26/2016;  week 5 Taxol/carboplatin 12/31/2016; radiation completed 01/03/2017  2. COPD  3. 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 Right hydronephrosis on the CT 10/29/2016  5. Dysphagia/weight loss secondary to #1  6. Rash upper back 12/10/2016. Question etiology.  7. Feeding tube placement 12/26/2016     Disposition: Karen Osborn appears unchanged. She is approximately 3 weeks out from completing radiation/chemotherapy. Her weight is stable. She continues the tube feedings. Her performance status will hopefully improve over the next few weeks. We will see her back on 02/06/2017 to reevaluate. She will contact the office in the interim with any problems.  Plan reviewed with Dr. Benay Spice.    Ned Card ANP/GNP-BC   01/23/2017  10:14 AM

## 2017-01-23 NOTE — Telephone Encounter (Signed)
Appointment scheduled per 01/23/17 los. Patient was given a copy of the AVS report and appointment schedule per 01/23/17 los. °

## 2017-01-23 NOTE — Progress Notes (Signed)
Nutrition follow-up completed with patient and husband status post treatment for esophageal cancer. Weight increased slightly documented as 69.3 pounds increased from 68.6 pounds January 16. Patient reports she is tolerating one can Osmolite 1.5 with 120 cc free water before and after bolus feedings 5 times daily. She is able to eat a little peanutbutter and crackers and sips of ginger ale, but nothing else. Continues to complain of fatigue and weakness.  Denies vomiting, diarrhea or constipation. Reports she takes nausea medication every morning.  Estimated nutrition needs: 1200-1400 calories, 48-60 grams protein, 1.4 L fluid.  Nutrition diagnosis: Malnutrition continues.  Intervention: Patient will continue Osmolite 1.5 with 120 cc free water before and after 5 times daily. Tube feeding is providing 1775 cal, 74.5 g protein, and 2105 mL free water.  This meets greater than 100% of estimated nutrition needs. Questions were answered.  Teach back method used.  Monitoring, evaluation, goals: Patient will tolerate current tube feeding regimen.  Continue to promote weight gain.  Next visit: I will follow as needed.  **Disclaimer: This note was dictated with voice recognition software. Similar sounding words can inadvertently be transcribed and this note may contain transcription errors which may not have been corrected upon publication of note.**

## 2017-01-28 ENCOUNTER — Telehealth: Payer: Self-pay | Admitting: *Deleted

## 2017-01-28 NOTE — Telephone Encounter (Signed)
CALLED PATIENT TO ALTER FU ON 02-12-17 PER ALISON PERKINS REQUEST, APPT. MOVED TO 9:15 AM ON 02-12-17, PATIENT AGREED TO THIS NEW TIME

## 2017-01-30 ENCOUNTER — Encounter: Payer: Self-pay | Admitting: Oncology

## 2017-01-30 NOTE — Progress Notes (Signed)
Received PA request form from [place] for Megace. Gave to RN to complete and return to me.

## 2017-02-03 ENCOUNTER — Encounter (HOSPITAL_COMMUNITY): Payer: Self-pay | Admitting: Oncology

## 2017-02-03 ENCOUNTER — Telehealth: Payer: Self-pay | Admitting: *Deleted

## 2017-02-03 ENCOUNTER — Emergency Department (HOSPITAL_COMMUNITY): Payer: Medicare Other

## 2017-02-03 ENCOUNTER — Inpatient Hospital Stay (HOSPITAL_COMMUNITY)
Admission: EM | Admit: 2017-02-03 | Discharge: 2017-02-05 | DRG: 177 | Disposition: A | Discharge status: Home / Self Care | Payer: Medicare Other | Attending: Internal Medicine | Admitting: Internal Medicine

## 2017-02-03 DIAGNOSIS — Z808 Family history of malignant neoplasm of other organs or systems: Secondary | ICD-10-CM

## 2017-02-03 DIAGNOSIS — Z8249 Family history of ischemic heart disease and other diseases of the circulatory system: Secondary | ICD-10-CM | POA: Diagnosis not present

## 2017-02-03 DIAGNOSIS — R0902 Hypoxemia: Secondary | ICD-10-CM | POA: Diagnosis present

## 2017-02-03 DIAGNOSIS — Z79899 Other long term (current) drug therapy: Secondary | ICD-10-CM

## 2017-02-03 DIAGNOSIS — Z931 Gastrostomy status: Secondary | ICD-10-CM | POA: Diagnosis not present

## 2017-02-03 DIAGNOSIS — E43 Unspecified severe protein-calorie malnutrition: Secondary | ICD-10-CM | POA: Diagnosis present

## 2017-02-03 DIAGNOSIS — Z853 Personal history of malignant neoplasm of breast: Secondary | ICD-10-CM | POA: Diagnosis not present

## 2017-02-03 DIAGNOSIS — Z681 Body mass index (BMI) 19 or less, adult: Secondary | ICD-10-CM | POA: Diagnosis not present

## 2017-02-03 DIAGNOSIS — G4700 Insomnia, unspecified: Secondary | ICD-10-CM | POA: Diagnosis not present

## 2017-02-03 DIAGNOSIS — J189 Pneumonia, unspecified organism: Secondary | ICD-10-CM | POA: Diagnosis present

## 2017-02-03 DIAGNOSIS — K219 Gastro-esophageal reflux disease without esophagitis: Secondary | ICD-10-CM | POA: Diagnosis not present

## 2017-02-03 DIAGNOSIS — Z888 Allergy status to other drugs, medicaments and biological substances status: Secondary | ICD-10-CM

## 2017-02-03 DIAGNOSIS — F17210 Nicotine dependence, cigarettes, uncomplicated: Secondary | ICD-10-CM | POA: Diagnosis not present

## 2017-02-03 DIAGNOSIS — C154 Malignant neoplasm of middle third of esophagus: Secondary | ICD-10-CM | POA: Diagnosis not present

## 2017-02-03 DIAGNOSIS — R1310 Dysphagia, unspecified: Secondary | ICD-10-CM | POA: Diagnosis present

## 2017-02-03 DIAGNOSIS — J690 Pneumonitis due to inhalation of food and vomit: Principal | ICD-10-CM | POA: Diagnosis present

## 2017-02-03 DIAGNOSIS — Z885 Allergy status to narcotic agent status: Secondary | ICD-10-CM

## 2017-02-03 DIAGNOSIS — J449 Chronic obstructive pulmonary disease, unspecified: Secondary | ICD-10-CM | POA: Diagnosis present

## 2017-02-03 LAB — CBC WITH DIFFERENTIAL/PLATELET
BASOS PCT: 0 %
Basophils Absolute: 0 10*3/uL (ref 0.0–0.1)
Eosinophils Absolute: 0.2 10*3/uL (ref 0.0–0.7)
Eosinophils Relative: 3 %
HEMATOCRIT: 37.5 % (ref 36.0–46.0)
Hemoglobin: 12.4 g/dL (ref 12.0–15.0)
LYMPHS ABS: 0.5 10*3/uL — AB (ref 0.7–4.0)
Lymphocytes Relative: 7 %
MCH: 31.9 pg (ref 26.0–34.0)
MCHC: 33.1 g/dL (ref 30.0–36.0)
MCV: 96.4 fL (ref 78.0–100.0)
MONOS PCT: 5 %
Monocytes Absolute: 0.4 10*3/uL (ref 0.1–1.0)
NEUTROS ABS: 6.6 10*3/uL (ref 1.7–7.7)
NEUTROS PCT: 85 %
Platelets: 288 10*3/uL (ref 150–400)
RBC: 3.89 MIL/uL (ref 3.87–5.11)
RDW: 16.7 % — ABNORMAL HIGH (ref 11.5–15.5)
WBC: 7.7 10*3/uL (ref 4.0–10.5)

## 2017-02-03 LAB — COMPREHENSIVE METABOLIC PANEL
ALT: 20 U/L (ref 14–54)
ANION GAP: 8 (ref 5–15)
AST: 20 U/L (ref 15–41)
Albumin: 3.6 g/dL (ref 3.5–5.0)
Alkaline Phosphatase: 67 U/L (ref 38–126)
BUN: 27 mg/dL — ABNORMAL HIGH (ref 6–20)
CHLORIDE: 94 mmol/L — AB (ref 101–111)
CO2: 28 mmol/L (ref 22–32)
Calcium: 8.7 mg/dL — ABNORMAL LOW (ref 8.9–10.3)
Creatinine, Ser: 0.36 mg/dL — ABNORMAL LOW (ref 0.44–1.00)
GFR calc non Af Amer: >60 mL/min (ref >60)
Glucose, Bld: 139 mg/dL — ABNORMAL HIGH (ref 65–99)
Potassium: 4 mmol/L (ref 3.5–5.1)
SODIUM: 130 mmol/L — AB (ref 135–145)
Total Bilirubin: 0.6 mg/dL (ref 0.3–1.2)
Total Protein: 6.4 g/dL — ABNORMAL LOW (ref 6.5–8.1)

## 2017-02-03 LAB — I-STAT CG4 LACTIC ACID, ED
LACTIC ACID, VENOUS: 1.65 mmol/L (ref 0.5–1.9)
LACTIC ACID, VENOUS: 1.44 mmol/L (ref 0.5–1.9)

## 2017-02-03 LAB — I-STAT TROPONIN, ED: Troponin i, poc: 0 ng/mL (ref 0.00–0.08)

## 2017-02-03 LAB — PROTIME-INR
INR: 0.85
Prothrombin Time: 11.6 seconds (ref 11.4–15.2)

## 2017-02-03 LAB — URINALYSIS, ROUTINE W REFLEX MICROSCOPIC
BILIRUBIN URINE: NEGATIVE
Glucose, UA: NEGATIVE mg/dL
Hgb urine dipstick: NEGATIVE
Ketones, ur: NEGATIVE mg/dL
Leukocytes, UA: NEGATIVE
NITRITE: NEGATIVE
PH: 7 (ref 5.0–8.0)
Protein, ur: NEGATIVE mg/dL
Specific Gravity, Urine: 1.038 — ABNORMAL HIGH (ref 1.005–1.030)

## 2017-02-03 MED ORDER — ACETAMINOPHEN 500 MG PO TABS: 500.0000 mg | ORAL_TABLET | Freq: Four times a day (QID) | ORAL | Status: DC | PRN

## 2017-02-03 MED ORDER — SODIUM CHLORIDE 0.9 % IJ SOLN
INTRAMUSCULAR | Status: AC
  Filled 2017-02-03: qty 50

## 2017-02-03 MED ORDER — ALBUTEROL SULFATE (2.5 MG/3ML) 0.083% IN NEBU: 2.5000 mg | INHALATION_SOLUTION | RESPIRATORY_TRACT | Status: DC | PRN

## 2017-02-03 MED ORDER — IOPAMIDOL (ISOVUE-370) INJECTION 76%
INTRAVENOUS | Status: AC
  Administered 2017-02-03: 55 mL
  Filled 2017-02-03: qty 100

## 2017-02-03 MED ORDER — DEXTROSE 5 % IV SOLN
1.0000 g | Freq: Once | INTRAVENOUS | Status: AC
  Administered 2017-02-03: 1 g via INTRAVENOUS
  Filled 2017-02-03: qty 10

## 2017-02-03 MED ORDER — SODIUM CHLORIDE 0.9 % IV BOLUS (SEPSIS)
500.0000 mL | Freq: Once | INTRAVENOUS | Status: AC
  Administered 2017-02-03: 500 mL via INTRAVENOUS

## 2017-02-03 MED ORDER — DEXTROSE 5 % IV SOLN: 500.0000 mg | INTRAVENOUS | Status: DC

## 2017-02-03 MED ORDER — DEXTROSE 5 % IV SOLN: 1.0000 g | INTRAVENOUS | Status: DC

## 2017-02-03 MED ORDER — ENOXAPARIN SODIUM 40 MG/0.4ML ~~LOC~~ SOLN
40.0000 mg | SUBCUTANEOUS | Status: DC
  Administered 2017-02-03: 40 mg via SUBCUTANEOUS
  Filled 2017-02-03: qty 0.4

## 2017-02-03 MED ORDER — MAGNESIUM HYDROXIDE 400 MG/5ML PO SUSP: 30.0000 mL | Freq: Every day | ORAL | Status: DC | PRN

## 2017-02-03 MED ORDER — SODIUM CHLORIDE 0.9 % IV BOLUS (SEPSIS)
1000.0000 mL | Freq: Once | INTRAVENOUS | Status: AC
  Administered 2017-02-03: 1000 mL via INTRAVENOUS

## 2017-02-03 MED ORDER — ALBUTEROL SULFATE (2.5 MG/3ML) 0.083% IN NEBU
5.0000 mg | INHALATION_SOLUTION | Freq: Once | RESPIRATORY_TRACT | Status: AC
  Administered 2017-02-03: 5 mg via RESPIRATORY_TRACT
  Filled 2017-02-03: qty 6

## 2017-02-03 MED ORDER — POLYETHYLENE GLYCOL 3350 17 G PO PACK: 17.0000 g | PACK | Freq: Every day | ORAL | Status: DC | PRN

## 2017-02-03 MED ORDER — DEXTROSE 5 % IV SOLN
500.0000 mg | Freq: Once | INTRAVENOUS | Status: AC
  Administered 2017-02-03: 500 mg via INTRAVENOUS
  Filled 2017-02-03 (×2): qty 500

## 2017-02-03 NOTE — ED Notes (Signed)
RT paged for neb and eval.

## 2017-02-03 NOTE — ED Notes (Signed)
Patient transported to X-ray 

## 2017-02-03 NOTE — Telephone Encounter (Signed)
"  This is [employer] calling for my mother.  She is having trouble breathing.  I think she needs to be seen."   Asked if fever, cough, audible lung sounds, color changes, skin warm and dry, nausea or tube feeding problems.  "I'm at work.  My dad just called me reporting this.  She just told him today that she's having trouble for several days.  I know it must be bad." This nurse will call the home for further assessment.  "He can't hear so they may not answer the phone.  I told him to get her dressed."    Advised report to the ED.  "Can't she see someone there?"  If she's having trouble breathing, she needs higher level care, quickly.  Call 911 if in distress.

## 2017-02-03 NOTE — ED Notes (Signed)
Patient transported to CT 

## 2017-02-03 NOTE — ED Notes (Signed)
RT at bedside.

## 2017-02-03 NOTE — ED Notes (Signed)
Bed: [id number] Expected date:  Expected time:  Means of arrival:  Comments: Triage chemo

## 2017-02-03 NOTE — ED Provider Notes (Signed)
[facility] DEPT Provider Note   CSN: [id number] Arrival date & time: 02/03/17  1515     History   Chief Complaint Chief Complaint  Patient presents with  . Shortness of Breath  . Cancer    HPI Karen Osborn is a 76 y.o. female     The history is provided by the patient and medical records. No language interpreter was used.  Shortness of Breath  This is a recurrent problem. The average Osborn lasts 3 days. The problem occurs continuously.The current Osborn started more than 2 days ago. The problem has not changed since onset.Associated symptoms include a fever, rhinorrhea, cough, sputum production, hemoptysis and wheezing. Pertinent negatives include no headaches, no neck pain, no chest pain, no syncope, no vomiting, no abdominal pain, no rash, no leg pain and no leg swelling. Risk factors: cancer. She has tried nothing for the symptoms. Associated medical issues include COPD.    Past Medical History:  Diagnosis Date  . Breast cancer ([place])    left  . COPD (chronic obstructive pulmonary disease) ([place])   . Dyspnea   . GERD (gastroesophageal reflux disease)     Patient Active Problem List   Diagnosis Date Noted  . Malignant neoplasm of middle third of esophagus ([place]) 10/30/2016  . Difficulty in swallowing 10/24/2016  . COPD  GOLD III still smoking  04/24/2016  . Cigarette smoker 04/24/2016  . Breast cancer ([place]) 02/18/2013  . 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 BREAST LUMPECTOMY Left 1/132006  . ESOPHAGOGASTRODUODENOSCOPY (EGD) WITH PROPOFOL N/A 10/24/2016   Procedure: ESOPHAGOGASTRODUODENOSCOPY (EGD) WITH PROPOFOL;  Surgeon: Wilford Corner, MD;  Location[facility] ENDOSCOPY;  Service: Endoscopy;  Laterality: N/A;  . IR GENERIC HISTORICAL  12/26/2016   IR GASTROSTOMY TUBE MOD SED 12/26/2016 Jacqulynn Cadet, MD [facility]-INTERV RAD  . IR GENERIC HISTORICAL  01/17/2017   IR PATIENT EVAL TECH 0-60 MINS [facility]-INTERV RAD    OB History     No data available       Home Medications    Prior to Admission medications   Medication Sig Start Date End Date Taking? Authorizing Provider  acetaminophen (TYLENOL) 500 MG tablet Take 500 mg by mouth every 6 (six) hours as needed.    Historical Provider, MD  albuterol (PROAIR HFA) 108 (90 Base) MCG/ACT inhaler 2 puffs every 4 hours as needed only  if your can't catch your breath Patient not taking: Reported on 01/14/2017 09/26/16   Tanda Rockers, MD  amoxicillin (AMOXIL) 250 MG/5ML suspension Take 10 mLs (500 mg total) by mouth 2 (two) times daily. For 5 days 01/17/17   Ladell Pier, MD  docusate sodium (COLACE) 100 MG capsule Take 200 mg by mouth daily.    Historical Provider, MD  magnesium hydroxide (PHILLIPS MILK OF MAGNESIA) 400 MG/5ML suspension Take 30 mLs by mouth daily as needed for mild constipation.    Historical Provider, MD  megestrol (MEGACE) 400 MG/10ML suspension Take 5 mLs (200 mg total) by mouth 2 (two) times daily. 01/14/17   Ladell Pier, MD  mineral oil (FLEET MINERAL OIL) enema Place 1 enema rectally once.    Historical Provider, MD  Nutritional Supplements (FEEDING SUPPLEMENT, OSMOLITE 1.5 CAL,) LIQD Increase osmolite 1.5 to one bottle 5 times daily with 90 cc free water before and after bolus feeding. 01/14/17   Ladell Pier, MD  polyethylene glycol [facility] Floria Raveling) packet Take 17 g by mouth daily as needed for moderate constipation.  Historical Provider, MD  Potassium Chloride 40 MEQ/15ML (20%) SOLN Place 20 mEq into feeding tube daily. Via g tube 01/03/17   Owens Shark, NP  prochlorperazine (COMPAZINE) 5 MG tablet Take 1 tablet (5 mg total) by mouth every 6 (six) hours as needed for nausea or vomiting. 11/01/16   Ladell Pier, MD  SPIRIVA RESPIMAT 2.5 MCG/ACT AERS INL 2 PFS INTO THE LUNGS D 08/30/16   Historical Provider, MD  sucralfate (CARAFATE) 1 g tablet Take 1 tablet (1 g total) by mouth 4 (four) times daily. Patient not taking: Reported on  01/14/2017 11/25/16   Kyung Rudd, MD  Wound Dressings (SONAFINE EX) Apply 1 application topically daily.    Historical Provider, MD    Family History Family History  Problem Relation Age of Onset  . Heart attack Mother   . Heart attack Father   . Arrhythmia Sister   . Cancer Sister     Breast Cancer  . COPD Brother   . Cancer Sister     Skin Cancer, Breast Cancer    Social History Social History  Substance Use Topics  . Smoking status: Current Every Day Smoker    Packs/day: 0.50    Years: 54.00    Types: Cigarettes  . Smokeless tobacco: Never Used  . Alcohol use No     Allergies   Benadryl [diphenhydramine hcl] and Codeine   Review of Systems Review of Systems  Constitutional: Positive for chills, fatigue and fever. Negative for diaphoresis.  HENT: Positive for congestion and rhinorrhea.   Respiratory: Positive for cough, hemoptysis, sputum production, chest tightness, shortness of breath and wheezing. Negative for stridor.   Cardiovascular: Negative for chest pain, leg swelling and syncope.  Gastrointestinal: Negative for abdominal pain, constipation, diarrhea, nausea and vomiting.  Genitourinary: Negative for dysuria and flank pain.  Musculoskeletal: Negative for back pain and neck pain.  Skin: Negative for rash and wound.  Neurological: Negative for weakness, light-headedness and headaches.  Psychiatric/Behavioral: Negative for agitation.  All other systems reviewed and are negative.    Physical Exam Updated Vital Signs BP 129/77 (BP Location: Left Arm)   Pulse 107   Temp 98.4 F (36.9 C) (Oral)   Resp (!) 38   SpO2 95%   Physical Exam  Constitutional: She appears well-developed and well-nourished. No distress.  HENT:  Head: Normocephalic and atraumatic.  Mouth/Throat: Oropharynx is clear and moist. No oropharyngeal exudate.  Eyes: Conjunctivae are normal. Pupils are equal, round, and reactive to light.  Neck: Neck supple.  Cardiovascular: Regular  rhythm and intact distal pulses.  Tachycardia present.   No murmur heard. Pulmonary/Chest: No stridor. Tachypnea noted. No respiratory distress. She has no decreased breath sounds. She has wheezes. She has rhonchi. She has no rales. She exhibits no tenderness.  Abdominal: Soft. There is no tenderness.  Musculoskeletal: She exhibits no edema or tenderness.  Neurological: She is alert. No cranial nerve deficit or sensory deficit. She exhibits normal muscle tone.  Skin: Skin is warm and dry. Capillary refill takes less than 2 seconds. No rash noted. No erythema.  Psychiatric: She has a normal mood and affect.  Nursing note and vitals reviewed.    ED Treatments / Results  Labs (all labs ordered are listed, but only abnormal results are displayed) Labs Reviewed  COMPREHENSIVE METABOLIC PANEL - Abnormal; Notable for the following:       Result Value   Sodium 130 (*)    Chloride 94 (*)    Glucose, Bld 139 (*)  BUN 27 (*)    Creatinine, Ser 0.36 (*)    Calcium 8.7 (*)    Total Protein 6.4 (*)    All other components within normal limits  CBC WITH DIFFERENTIAL/PLATELET - Abnormal; Notable for the following:    RDW 16.7 (*)    Lymphs Abs 0.5 (*)    All other components within normal limits  URINALYSIS, ROUTINE W REFLEX MICROSCOPIC - Abnormal; Notable for the following:    Specific Gravity, Urine 1.038 (*)    All other components within normal limits  CULTURE, BLOOD (ROUTINE X 2)  CULTURE, BLOOD (ROUTINE X 2)  GRAM STAIN  CULTURE, EXPECTORATED SPUTUM-ASSESSMENT  PROTIME-INR  STREP PNEUMONIAE URINARY ANTIGEN  HIV ANTIBODY (ROUTINE TESTING)  I-STAT CG4 LACTIC ACID, ED  I-STAT TROPOININ, ED  I-STAT TROPOININ, ED  I-STAT CG4 LACTIC ACID, ED    EKG  EKG Interpretation  Date/Time:  Monday February 03 2017 15:52:14 EST Ventricular Rate:  103 PR Interval:    QRS Duration: 77 QT Interval:  326 QTC Calculation: 427 R Axis:   79 Text Interpretation:  Sinus tachycardia Anterior  infarct, old Borderline T abnormalities, inferior leads When compared to prior, no significant abnormalities were seen other than slightly sharper T waves in V3, V4 No STEMI Confirmed by Sherry Ruffing MD, Taheerah Guldin [phone]) on 02/03/2017 4:15:26 PM       Radiology Dg Chest 2 View  Result Date: 02/03/2017 CLINICAL DATA:  Three days of coughing and shortness of breath with fever and chills. The patient is undergoing chemotherapy for esophageal malignancy. History of COPD and previous left breast malignancy. EXAM: CHEST  2 VIEW COMPARISON:  Chest x-ray of April 24, 2016 FINDINGS: The lungs remain hyperinflated and hyperlucent. There is no alveolar infiltrate. There is no pleural effusion. No pulmonary parenchymal nodules or masses are observed. The heart and pulmonary vascularity are normal. The mediastinum is normal in width. There is calcification in the wall of the aortic arch. The bony thorax exhibits no acute abnormality. IMPRESSION: Marked hyperinflation consistent with COPD. No acute pneumonia nor CHF. No evidence of metastatic disease. Thoracic aortic atherosclerosis. Electronically Signed   By: David  Martinique M.D.   On: 02/03/2017 16:36   Ct Angio Chest Pe W And/or Wo Contrast  Result Date: 02/03/2017 CLINICAL DATA:  Shortness of breath, cough. History of esophageal cancer. EXAM: CT ANGIOGRAPHY CHEST WITH CONTRAST TECHNIQUE: Multidetector CT imaging of the chest was performed using the standard protocol during bolus administration of intravenous contrast. Multiplanar CT image reconstructions and MIPs were obtained to evaluate the vascular anatomy. CONTRAST:  55 mL of Isovue 370 intravenously. COMPARISON:  CT scan of October 29, 2016. FINDINGS: Cardiovascular: Atherosclerosis of thoracic aorta is noted without aneurysm or dissection. There is no definite evidence of pulmonary embolus. Mediastinum/Nodes: No mediastinal adenopathy is noted. There is thickening of the middle and distal portions of the esophagus  consistent with a history of esophageal cancer. Thyroid gland is unremarkable. Lungs/Pleura: Mild emphysematous disease is noted in the upper lobes bilaterally. Abnormal density is seen in the lower lobe bronchi bilaterally suggesting mucous plugging or aspirated material. No pneumothorax or pleural effusion is noted. Upper Abdomen: No acute abnormality. Musculoskeletal: No chest wall abnormality. No acute or significant osseous findings. Review of the MIP images confirms the above findings. IMPRESSION: Aortic atherosclerosis. No definite evidence of pulmonary embolus. Thickening of the middle and distal portions of the esophagus consistent with history of esophageal cancer. Mild emphysematous disease is noted in the upper lobes bilaterally. Abnormal density  is noted within the lower lobe bronchi bilaterally suggesting mucous plugging or aspirated material. Electronically Signed   By: Marijo Conception, M.D.   On: 02/03/2017 16:54    Procedures Procedures (including critical care time)  Medications Ordered in ED Medications  sodium chloride 0.9 % injection (not administered)  albuterol (PROVENTIL) (2.5 MG/3ML) 0.083% nebulizer solution 2.5 mg (not administered)  acetaminophen (TYLENOL) tablet 500 mg (not administered)  magnesium hydroxide (MILK OF MAGNESIA) suspension 30 mL (not administered)  polyethylene glycol (MIRALAX / GLYCOLAX) packet 17 g (not administered)  ampicillin-sulbactam (UNASYN) 1.5 g in sodium chloride 0.9 % 50 mL IVPB (not administered)  enoxaparin (LOVENOX) injection 20 mg (not administered)  albuterol (PROVENTIL) (2.5 MG/3ML) 0.083% nebulizer solution 5 mg (5 mg Nebulization Given 02/03/17 1550)  sodium chloride 0.9 % bolus 500 mL (0 mLs Intravenous Stopped 02/03/17 1831)  iopamidol (ISOVUE-370) 76 % injection (55 mLs  Contrast Given 02/03/17 1635)  cefTRIAXone (ROCEPHIN) 1 g in dextrose 5 % 50 mL IVPB (1 g Intravenous New Bag/Given 02/03/17 2007)  azithromycin (ZITHROMAX) 500 mg in  dextrose 5 % 250 mL IVPB (500 mg Intravenous Given 02/03/17 2227)  sodium chloride 0.9 % bolus 1,000 mL (1,000 mLs Intravenous New Bag/Given 02/03/17 2006)     Initial Impression / Assessment and Plan / ED Course  I have reviewed the triage vital signs and the nursing notes.  Pertinent labs & imaging results that were available during my care of the patient were reviewed by me and considered in my medical decision making (see chart for details).     MABELINE HINGSON is a 76 y.o. female With a past medical history significant COPD, esophageal reflux, esophageal cancer status post feeding tube/chemotherapy/and radiation Who presents with fevers, chills, congestion, productive cough, and shortness of breath. Patient is accompanied by family who reports that the last several days, her breathing and cough has worsened. She reports feeling bad for the last 2 weeks. She says that her cough is producing phlegm and some hemoptysis. She's unsure if this is from her throat or her lungs. She denies chest pain, nausea, vomiting, abdominal pain, changes in bowel movements, or changes in urination. She denies other complaints on arrival.  On exam, patient is breathing fast, tachycardic, and appears uncomfortable. Lungs revealed wheezing and course breath sounds. Given her vital signs, shortness of breath, and current cancer, in the setting of hemoptysis, CT scan will be performed to look for pulmonary embolism.  CT scan showed no PE but there was evidence of aspiration versus mucus plugging. Given her continued vital sign abnormalities and shortness of breath, patient will be treated for pneumonia and admitted for further management.  Patient admitted for further management without other competition or problem. Patient admitted in stable condition.   Final Clinical Impressions(s) / ED Diagnoses   Final diagnoses:  Community acquired pneumonia, unspecified laterality    New Prescriptions Current Discharge  Medication List      Clinical Impression: 1. Community acquired pneumonia, unspecified laterality     Disposition: Admit to Hospitalist service    Courtney Paris, MD 02/04/17 [phone]

## 2017-02-03 NOTE — ED Notes (Signed)
ED Provider at bedside. 

## 2017-02-03 NOTE — ED Triage Notes (Signed)
Pt has a hx of esophageal CA and states she has had SOB with coughing x 3 days. States she has had fever/chills at home as well. Alert and oriented. Last chemo was beginning of January.

## 2017-02-03 NOTE — H&P (Signed)
History and Physical    Karen Osborn [id number] DOB: 05/16/1941 DOA: 02/03/2017   PCP: Judge Stall, FNP Chief Complaint:  Chief Complaint  Patient presents with  . Shortness of Breath  . Cancer    HPI: Karen Osborn is a 76 y.o. female with medical history significant of esophageal CA, last chemo in early Jan.  Patient had aspiration / difficulty swallowing water about 2 days ago when she was trying to swallow a pill.  States she usually has most trouble with liquids.  Since then she has had wet sounding cough, SOB, subjective fever and chills.  Symptoms are persistent, nothing makes them better.  ED Course: CT chest shows aspirate in BLL of lungs.  WBC 7.7k, no fever, mild tachypnea and tachycardia.  Review of Systems: As per HPI otherwise 10 point review of systems negative.    Past Medical History:  Diagnosis Date  . Breast cancer ([place])    left  . COPD (chronic obstructive pulmonary disease) ([place])   . Dyspnea   . GERD (gastroesophageal reflux disease)     Past Surgical History:  Procedure Laterality Date  . BREAST LUMPECTOMY Left 1/132006  . ESOPHAGOGASTRODUODENOSCOPY (EGD) WITH PROPOFOL N/A 10/24/2016   Procedure: ESOPHAGOGASTRODUODENOSCOPY (EGD) WITH PROPOFOL;  Surgeon: Wilford Corner, MD;  Location: [facility] ENDOSCOPY;  Service: Endoscopy;  Laterality: N/A;  . IR GENERIC HISTORICAL  12/26/2016   IR GASTROSTOMY TUBE MOD SED 12/26/2016 Jacqulynn Cadet, MD [facility]  . IR GENERIC HISTORICAL  01/17/2017   IR PATIENT EVAL TECH 0-60 MINS [facility]     reports that she has been smoking Cigarettes.  She has a 27.00 pack-year smoking history. She has never used smokeless tobacco. She reports that she does not drink alcohol or use drugs.  Allergies  Allergen Reactions  . Benadryl [Diphenhydramine Hcl] Other (See Comments)    Jittery, patient states "legs bouncing up and down"   . Codeine Nausea And Vomiting    Family History  Problem Relation Age of  Onset  . Heart attack Mother   . Heart attack Father   . Arrhythmia Sister   . Cancer Sister     Breast Cancer  . COPD Brother   . Cancer Sister     Skin Cancer, Breast Cancer      Prior to Admission medications   Medication Sig Start Date End Date Taking? Authorizing Provider  albuterol (PROAIR HFA) 108 (90 Base) MCG/ACT inhaler 2 puffs every 4 hours as needed only  if your can't catch your breath 09/26/16  Yes Tanda Rockers, MD  megestrol (MEGACE) 400 MG/10ML suspension Take 5 mLs (200 mg total) by mouth 2 (two) times daily. 01/14/17  Yes Ladell Pier, MD  acetaminophen (TYLENOL) 500 MG tablet Take 500 mg by mouth every 6 (six) hours as needed for moderate pain.     Historical Provider, MD  magnesium hydroxide (PHILLIPS MILK OF MAGNESIA) 400 MG/5ML suspension Take 30 mLs by mouth daily as needed for mild constipation.    Historical Provider, MD  polyethylene glycol (MIRALAX / GLYCOLAX) packet Take 17 g by mouth daily as needed for moderate constipation.    Historical Provider, MD    Physical Exam: Vitals:   02/03/17 1700 02/03/17 1827 02/03/17 1930 02/03/17 2000  BP: 101/59 117/68 99/65 107/68  Pulse: 100 95 96 93  Resp: 24 (!) 34 24 (!) 31  Temp:      TempSrc:      SpO2: 94% 94% 95% 92%  Constitutional: NAD, calm, comfortable Eyes: PERRL, lids and conjunctivae normal ENMT: Mucous membranes are moist. Posterior pharynx clear of any exudate or lesions.Normal dentition.  Neck: normal, supple, no masses, no thyromegaly Respiratory: coarse breath sounds in BLL, no wheezes.  Wet sounding cough.  Cardiovascular: Regular rate and rhythm, no murmurs / rubs / gallops. No extremity edema. 2+ pedal pulses. No carotid bruits.  Abdomen: no tenderness, no masses palpated. No hepatosplenomegaly. Bowel sounds positive.  Musculoskeletal: no clubbing / cyanosis. No joint deformity upper and lower extremities. Good ROM, no contractures. Normal muscle tone.  Skin: no rashes, lesions,  ulcers. No induration Neurologic: CN 2-12 grossly intact. Sensation intact, DTR normal. Strength 5/5 in all 4.  Psychiatric: Normal judgment and insight. Alert and oriented x 3. Normal mood.    Labs on Admission: I have personally reviewed following labs and imaging studies  CBC:  Recent Labs Lab 02/03/17 1540  WBC 7.7  NEUTROABS 6.6  HGB 12.4  HCT 37.5  MCV 96.4  PLT 123XX123   Basic Metabolic Panel:  Recent Labs Lab 02/03/17 1540  NA 130*  K 4.0  CL 94*  CO2 28  GLUCOSE 139*  BUN 27*  CREATININE 0.36*  CALCIUM 8.7*   GFR: Estimated Creatinine Clearance: 31 mL/min (by C-G formula based on SCr of 0.36 mg/dL (L)). Liver Function Tests:  Recent Labs Lab 02/03/17 1540  AST 20  ALT 20  ALKPHOS 67  BILITOT 0.6  PROT 6.4*  ALBUMIN 3.6   No results for input(s): LIPASE, AMYLASE in the last 168 hours. No results for input(s): AMMONIA in the last 168 hours. Coagulation Profile:  Recent Labs Lab 02/03/17 1612  INR 0.85   Cardiac Enzymes: No results for input(s): CKTOTAL, CKMB, CKMBINDEX, TROPONINI in the last 168 hours. BNP (last 3 results) No results for input(s): PROBNP in the last 8760 hours. HbA1C: No results for input(s): HGBA1C in the last 72 hours. CBG: No results for input(s): GLUCAP in the last 168 hours. Lipid Profile: No results for input(s): CHOL, HDL, LDLCALC, TRIG, CHOLHDL, LDLDIRECT in the last 72 hours. Thyroid Function Tests: No results for input(s): TSH, T4TOTAL, FREET4, T3FREE, THYROIDAB in the last 72 hours. Anemia Panel: No results for input(s): VITAMINB12, FOLATE, FERRITIN, TIBC, IRON, RETICCTPCT in the last 72 hours. Urine analysis:    Component Value Date/Time   COLORURINE YELLOW 02/03/2017 1820   APPEARANCEUR CLEAR 02/03/2017 1820   LABSPEC 1.038 (H) 02/03/2017 1820   PHURINE 7.0 02/03/2017 1820   GLUCOSEU NEGATIVE 02/03/2017 1820   HGBUR NEGATIVE 02/03/2017 1820   BILIRUBINUR NEGATIVE 02/03/2017 1820   KETONESUR NEGATIVE  02/03/2017 1820   PROTEINUR NEGATIVE 02/03/2017 1820   NITRITE NEGATIVE 02/03/2017 1820   LEUKOCYTESUR NEGATIVE 02/03/2017 1820   Sepsis Labs: @LABRCNTIP (procalcitonin:4,lacticidven:4) )No results found for this or any previous visit (from the past 240 hour(s)).   Radiological Exams on Admission: Dg Chest 2 View  Result Date: 02/03/2017 CLINICAL DATA:  Three days of coughing and shortness of breath with fever and chills. The patient is undergoing chemotherapy for esophageal malignancy. History of COPD and previous left breast malignancy. EXAM: CHEST  2 VIEW COMPARISON:  Chest x-ray of April 24, 2016 FINDINGS: The lungs remain hyperinflated and hyperlucent. There is no alveolar infiltrate. There is no pleural effusion. No pulmonary parenchymal nodules or masses are observed. The heart and pulmonary vascularity are normal. The mediastinum is normal in width. There is calcification in the wall of the aortic arch. The bony thorax exhibits no acute abnormality.  IMPRESSION: Marked hyperinflation consistent with COPD. No acute pneumonia nor CHF. No evidence of metastatic disease. Thoracic aortic atherosclerosis. Electronically Signed   By: David  Martinique M.D.   On: 02/03/2017 16:36   Ct Angio Chest Pe W And/or Wo Contrast  Result Date: 02/03/2017 CLINICAL DATA:  Shortness of breath, cough. History of esophageal cancer. EXAM: CT ANGIOGRAPHY CHEST WITH CONTRAST TECHNIQUE: Multidetector CT imaging of the chest was performed using the standard protocol during bolus administration of intravenous contrast. Multiplanar CT image reconstructions and MIPs were obtained to evaluate the vascular anatomy. CONTRAST:  55 mL of Isovue 370 intravenously. COMPARISON:  CT scan of October 29, 2016. FINDINGS: Cardiovascular: Atherosclerosis of thoracic aorta is noted without aneurysm or dissection. There is no definite evidence of pulmonary embolus. Mediastinum/Nodes: No mediastinal adenopathy is noted. There is thickening of the  middle and distal portions of the esophagus consistent with a history of esophageal cancer. Thyroid gland is unremarkable. Lungs/Pleura: Mild emphysematous disease is noted in the upper lobes bilaterally. Abnormal density is seen in the lower lobe bronchi bilaterally suggesting mucous plugging or aspirated material. No pneumothorax or pleural effusion is noted. Upper Abdomen: No acute abnormality. Musculoskeletal: No chest wall abnormality. No acute or significant osseous findings. Review of the MIP images confirms the above findings. IMPRESSION: Aortic atherosclerosis. No definite evidence of pulmonary embolus. Thickening of the middle and distal portions of the esophagus consistent with history of esophageal cancer. Mild emphysematous disease is noted in the upper lobes bilaterally. Abnormal density is noted within the lower lobe bronchi bilaterally suggesting mucous plugging or aspirated material. Electronically Signed   By: Marijo Conception, M.D.   On: 02/03/2017 16:54    EKG: Independently reviewed.  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 Aspiration PNA - 1. PNA pathway 2. Cultures pending 3. Rocephin / azithromycin 4. Tele monitor for tachycardia 2. COPD - adult wheeze protocol, but no wheezing today 3. Aspiration - 1. NPO until SLP eval in AM 2. Dietary consult for tube feeds as needed via PEG 4. Esophageal CA - 1. S/p chemo and radiation 2. Left consult in EPIC for oncology.   DVT prophylaxis: Lovenox Code Status: Full Family Communication: Family at bedside Consults called: None, left order in Epic for onc consult. Admission status: Admit to inpatient   Etta Quill DO [facility] Pager [phone] from 7PM-7AM  If 7AM-7PM, please contact the day physician for the  patient [email or web address] Password [facility]  02/03/2017, 8:44 PM

## 2017-02-04 DIAGNOSIS — J690 Pneumonitis due to inhalation of food and vomit: Secondary | ICD-10-CM | POA: Diagnosis not present

## 2017-02-04 DIAGNOSIS — R1310 Dysphagia, unspecified: Secondary | ICD-10-CM

## 2017-02-04 DIAGNOSIS — J449 Chronic obstructive pulmonary disease, unspecified: Secondary | ICD-10-CM

## 2017-02-04 DIAGNOSIS — J189 Pneumonia, unspecified organism: Secondary | ICD-10-CM | POA: Diagnosis not present

## 2017-02-04 DIAGNOSIS — J181 Lobar pneumonia, unspecified organism: Secondary | ICD-10-CM

## 2017-02-04 DIAGNOSIS — C154 Malignant neoplasm of middle third of esophagus: Secondary | ICD-10-CM | POA: Diagnosis not present

## 2017-02-04 LAB — STREP PNEUMONIAE URINARY ANTIGEN: Strep Pneumo Urinary Antigen: NEGATIVE

## 2017-02-04 LAB — GLUCOSE, CAPILLARY
GLUCOSE-CAPILLARY: 120 mg/dL — AB (ref 65–99)
GLUCOSE-CAPILLARY: 156 mg/dL — AB (ref 65–99)
GLUCOSE-CAPILLARY: 101 mg/dL — AB (ref 65–99)
GLUCOSE-CAPILLARY: 104 mg/dL — AB (ref 65–99)

## 2017-02-04 MED ORDER — OSMOLITE 1.5 CAL PO LIQD
237.0000 mL | Freq: Every day | ORAL | Status: DC
  Administered 2017-02-05 (×2): 237 mL
  Filled 2017-02-04 (×7): qty 237

## 2017-02-04 MED ORDER — ONDANSETRON HCL 4 MG/2ML IJ SOLN
4.0000 mg | Freq: Four times a day (QID) | INTRAMUSCULAR | Status: DC | PRN
  Administered 2017-02-04: 4 mg via INTRAVENOUS
  Filled 2017-02-04: qty 2

## 2017-02-04 MED ORDER — JEVITY 1.2 CAL PO LIQD: 1000.0000 mL | ORAL | Status: DC

## 2017-02-04 MED ORDER — OSMOLITE 1.5 CAL PO LIQD: 237.0000 mL | Freq: Every day | ORAL | Status: DC

## 2017-02-04 MED ORDER — OSMOLITE 1.5 CAL PO LIQD
237.0000 mL | Freq: Four times a day (QID) | ORAL | Status: DC
  Administered 2017-02-04: 237 mL
  Filled 2017-02-04 (×3): qty 237

## 2017-02-04 MED ORDER — TRAZODONE HCL 50 MG PO TABS
50.0000 mg | ORAL_TABLET | Freq: Every evening | ORAL | Status: DC | PRN
  Administered 2017-02-04: 50 mg via ORAL
  Filled 2017-02-04: qty 1

## 2017-02-04 MED ORDER — ENOXAPARIN SODIUM 60 MG/0.6ML ~~LOC~~ SOLN
20.0000 mg | SUBCUTANEOUS | Status: DC
  Administered 2017-02-04: 21:00:00 20 mg via SUBCUTANEOUS
  Filled 2017-02-04: qty 0.6

## 2017-02-04 MED ORDER — SODIUM CHLORIDE 0.9 % IV SOLN
1.5000 g | Freq: Four times a day (QID) | INTRAVENOUS | Status: DC
  Administered 2017-02-04 – 2017-02-05 (×4): 1.5 g via INTRAVENOUS
  Filled 2017-02-04 (×5): qty 1.5

## 2017-02-04 NOTE — Progress Notes (Signed)
PROGRESS NOTE    Karen Osborn  [id number] DOB: 30-Dec-1941 DOA: 02/03/2017 PCP: Judge Stall, FNP   Chief Complaint  Patient presents with  . Shortness of Breath  . Cancer    Brief Narrative:  HPI on 02/03/2017 by Dr. Jennette Kettle Karen Osborn is a 76 y.o. female with medical history significant of esophageal CA, last chemo in early Jan.  Patient had aspiration / difficulty swallowing water about 2 days ago when she was trying to swallow a pill.  States she usually has most trouble with liquids.  Since then she has had wet sounding cough, SOB, subjective fever and chills.  Symptoms are persistent, nothing makes them better.  Admitted for asp pneumonia.   Assessment & Plan   Aspiration pneumonia -Patient does have esophageal cancer and does have PEG tube in place. She has had difficulty swallowing. Approximate 2 days ago, she attempted to swallow a stool softener with water and "got choked up" -CTA chest shoed no definite evidence of PE, abnormal density in lower lobe bronchi B/L-?mucous plugging or aspirated material -Speech therapy consulted and appreciated -Initially placed on azithromycin and ceftriaxone -Given aspiration, will place patient on Unasyn -Strep pneumonia urine antigen negative   COPD -Currently stable, no wheezing noted today -Continue albuterol neb PRN  Esophageal cancer -Status post chemotherapy and radiation therapy -Dr. Benay Spice, oncology, made aware of patient's admission  Severe malnutrition -In the context of chronic illness -Nutrition consultation appreciated, continue tube feeds -Megace currently held  DVT Prophylaxis  Lovenox  Code Status: Full  Family Communication: Family at bedside  Disposition Plan: Admitted. Home when stable  Consultants Oncology, Dr. Benay Spice  Procedures  None  Antibiotics   Anti-infectives    Start     Dose/Rate Route Frequency Ordered Stop   02/04/17 2200  azithromycin (ZITHROMAX) 500 mg in  dextrose 5 % 250 mL IVPB  Status:  Discontinued     500 mg 250 mL/hr over 60 Minutes Intravenous Every 24 hours 02/03/17 2041 02/04/17 1020   02/04/17 2000  cefTRIAXone (ROCEPHIN) 1 g in dextrose 5 % 50 mL IVPB  Status:  Discontinued     1 g 100 mL/hr over 30 Minutes Intravenous Every 24 hours 02/03/17 2041 02/04/17 1020   02/04/17 1200  ampicillin-sulbactam (UNASYN) 1.5 g in sodium chloride 0.9 % 50 mL IVPB     1.5 g 100 mL/hr over 30 Minutes Intravenous Every 6 hours 02/04/17 1035     02/03/17 2000  cefTRIAXone (ROCEPHIN) 1 g in dextrose 5 % 50 mL IVPB     1 g 100 mL/hr over 30 Minutes Intravenous  Once 02/03/17 1948 02/03/17 2037   02/03/17 2000  azithromycin (ZITHROMAX) 500 mg in dextrose 5 % 250 mL IVPB     500 mg 250 mL/hr over 60 Minutes Intravenous  Once 02/03/17 1948 02/03/17 2327      Subjective:   Karen Osborn seen and examined today.  Wants to go home.  At first she denied eating or drinking anything and then stated she got "choked on a stool softener and water." She continues to have cough. Denies shortness of breath, chest pain, abdominal pain, N/V/D/C.   Objective:   Vitals:   02/03/17 2130 02/03/17 2200 02/04/17 0520 02/04/17 0737  BP: 118/67 102/61 114/66   Pulse: 92 92 94   Resp: (!) 32 (!) 22 (!) 21   Temp:  98.1 F (36.7 C) 98.1 F (36.7 C)   TempSrc:  Oral Oral   SpO2: 93%  97% 94%   Weight:   31.8 kg (70 lb)   Height:    5\' 1"  (1.549 m)    Intake/Output Summary (Last 24 hours) at 02/04/17 1223 Last data filed at 02/03/17 1831  Gross per 24 hour  Intake              500 ml  Output                0 ml  Net              500 ml   Filed Weights   02/04/17 0520  Weight: 31.8 kg (70 lb)    Exam  General: Well developed, well nourished, NAD, appears stated age  HEENT: NCAT, mucous membranes moist.   Cardiovascular: S1 S2 auscultated, no rubs, murmurs or gallops. Regular rate and rhythm.  Respiratory: Diminished but clear, no wheezing or  rhonchi  Abdomen: Soft, nontender, nondistended, + bowel sounds, peg tube in place  Extremities: warm dry without cyanosis clubbing or edema  Neuro: AAOx3, nonfocal  Psych: Normal affect and demeanor    Data Reviewed: I have personally reviewed following labs and imaging studies  CBC:  Recent Labs Lab 02/03/17 1540  WBC 7.7  NEUTROABS 6.6  HGB 12.4  HCT 37.5  MCV 96.4  PLT 123XX123   Basic Metabolic Panel:  Recent Labs Lab 02/03/17 1540  NA 130*  K 4.0  CL 94*  CO2 28  GLUCOSE 139*  BUN 27*  CREATININE 0.36*  CALCIUM 8.7*   GFR: Estimated Creatinine Clearance: 31.4 mL/min (by C-G formula based on SCr of 0.36 mg/dL (L)). Liver Function Tests:  Recent Labs Lab 02/03/17 1540  AST 20  ALT 20  ALKPHOS 67  BILITOT 0.6  PROT 6.4*  ALBUMIN 3.6   No results for input(s): LIPASE, AMYLASE in the last 168 hours. No results for input(s): AMMONIA in the last 168 hours. Coagulation Profile:  Recent Labs Lab 02/03/17 1612  INR 0.85   Cardiac Enzymes: No results for input(s): CKTOTAL, CKMB, CKMBINDEX, TROPONINI in the last 168 hours. BNP (last 3 results) No results for input(s): PROBNP in the last 8760 hours. HbA1C: No results for input(s): HGBA1C in the last 72 hours. CBG:  Recent Labs Lab 02/04/17 1207  GLUCAP 120*   Lipid Profile: No results for input(s): CHOL, HDL, LDLCALC, TRIG, CHOLHDL, LDLDIRECT in the last 72 hours. Thyroid Function Tests: No results for input(s): TSH, T4TOTAL, FREET4, T3FREE, THYROIDAB in the last 72 hours. Anemia Panel: No results for input(s): VITAMINB12, FOLATE, FERRITIN, TIBC, IRON, RETICCTPCT in the last 72 hours. Urine analysis:    Component Value Date/Time   COLORURINE YELLOW 02/03/2017 1820   APPEARANCEUR CLEAR 02/03/2017 1820   LABSPEC 1.038 (H) 02/03/2017 1820   PHURINE 7.0 02/03/2017 1820   GLUCOSEU NEGATIVE 02/03/2017 1820   HGBUR NEGATIVE 02/03/2017 1820   BILIRUBINUR NEGATIVE 02/03/2017 1820   KETONESUR  NEGATIVE 02/03/2017 1820   PROTEINUR NEGATIVE 02/03/2017 1820   NITRITE NEGATIVE 02/03/2017 1820   LEUKOCYTESUR NEGATIVE 02/03/2017 1820   Sepsis Labs: @LABRCNTIP (procalcitonin:4,lacticidven:4)  )No results found for this or any previous visit (from the past 240 hour(s)).    Radiology Studies: Dg Chest 2 View  Result Date: 02/03/2017 CLINICAL DATA:  Three days of coughing and shortness of breath with fever and chills. The patient is undergoing chemotherapy for esophageal malignancy. History of COPD and previous left breast malignancy. EXAM: CHEST  2 VIEW COMPARISON:  Chest x-ray of April 24, 2016 FINDINGS: The  lungs remain hyperinflated and hyperlucent. There is no alveolar infiltrate. There is no pleural effusion. No pulmonary parenchymal nodules or masses are observed. The heart and pulmonary vascularity are normal. The mediastinum is normal in width. There is calcification in the wall of the aortic arch. The bony thorax exhibits no acute abnormality. IMPRESSION: Marked hyperinflation consistent with COPD. No acute pneumonia nor CHF. No evidence of metastatic disease. Thoracic aortic atherosclerosis. Electronically Signed   By: David  Martinique M.D.   On: 02/03/2017 16:36   Ct Angio Chest Pe W And/or Wo Contrast  Result Date: 02/03/2017 CLINICAL DATA:  Shortness of breath, cough. History of esophageal cancer. EXAM: CT ANGIOGRAPHY CHEST WITH CONTRAST TECHNIQUE: Multidetector CT imaging of the chest was performed using the standard protocol during bolus administration of intravenous contrast. Multiplanar CT image reconstructions and MIPs were obtained to evaluate the vascular anatomy. CONTRAST:  55 mL of Isovue 370 intravenously. COMPARISON:  CT scan of October 29, 2016. FINDINGS: Cardiovascular: Atherosclerosis of thoracic aorta is noted without aneurysm or dissection. There is no definite evidence of pulmonary embolus. Mediastinum/Nodes: No mediastinal adenopathy is noted. There is thickening of the  middle and distal portions of the esophagus consistent with a history of esophageal cancer. Thyroid gland is unremarkable. Lungs/Pleura: Mild emphysematous disease is noted in the upper lobes bilaterally. Abnormal density is seen in the lower lobe bronchi bilaterally suggesting mucous plugging or aspirated material. No pneumothorax or pleural effusion is noted. Upper Abdomen: No acute abnormality. Musculoskeletal: No chest wall abnormality. No acute or significant osseous findings. Review of the MIP images confirms the above findings. IMPRESSION: Aortic atherosclerosis. No definite evidence of pulmonary embolus. Thickening of the middle and distal portions of the esophagus consistent with history of esophageal cancer. Mild emphysematous disease is noted in the upper lobes bilaterally. Abnormal density is noted within the lower lobe bronchi bilaterally suggesting mucous plugging or aspirated material. Electronically Signed   By: Marijo Conception, M.D.   On: 02/03/2017 16:54     Scheduled Meds: . ampicillin-sulbactam (UNASYN) IV  1.5 g Intravenous Q6H  . enoxaparin (LOVENOX) injection  20 mg Subcutaneous Q24H  . feeding supplement (OSMOLITE 1.5 CAL)  237 mL Per Tube QID   Continuous Infusions:   LOS: 1 day   Time Spent in minutes   30 minutes  Norely Schlick D.O. on 02/04/2017 at 12:23 PM  Between 7am to 7pm - Pager - [phone]  After 7pm go to [email or web address] - password [email or web address]  And look for the night coverage person covering for me after hours  [facility] Office  [phone]

## 2017-02-04 NOTE — Progress Notes (Signed)
Pharmacy Antibiotic Note  Karen Osborn is a 76 y.o. female admitted on 02/03/2017 with aspiration pneumonia.  Pharmacy has been consulted for ampicillin/sulbactam dosing. Patient has esophageal cancer and experiencing aspiration with liquids.   Today, 02/04/2017:  Note low TBW  Low SCr  Afebrile  WBC WNL  Plan:  Unasyn 1.5gm IV q6h   Follow renal function and cultures  Height: 5\' 1"  (154.9 cm) Weight: 70 lb (31.8 kg) IBW/kg (Calculated) : 47.8  Temp (24hrs), Avg:98.3 F (36.8 C), Min:98.1 F (36.7 C), Max:98.4 F (36.9 C)   Recent Labs Lab 02/03/17 1540 02/03/17 1554 02/03/17 2023  WBC 7.7  --   --   CREATININE 0.36*  --   --   LATICACIDVEN  --  1.65 1.44    Estimated Creatinine Clearance: 31.4 mL/min (by C-G formula based on SCr of 0.36 mg/dL (L)).    Allergies  Allergen Reactions  . Benadryl [Diphenhydramine Hcl] Other (See Comments)    Jittery, patient states "legs bouncing up and down"   . Codeine Nausea And Vomiting    Antimicrobials this admission: 2/6 BCx:   Dose adjustments this admission:  Microbiology results: 2/6 BCx:  2/6 strep Ag: neg Sputum:    Thank you for allowing pharmacy to be a part of this patient's care.  Doreene Eland, PharmD, BCPS.   Pager: [phone] 02/04/2017 10:29 AM

## 2017-02-04 NOTE — Progress Notes (Signed)
Initial Nutrition Assessment  DOCUMENTATION CODES:   Severe malnutrition in context of chronic illness, Underweight  INTERVENTION:   Continue Home TF regimen of Osmolite 1.5, 4 cans daily. (Administer 0800, 1100, 1400, 1700). Recommend free water flushes of 120 ml before and after each bolus feed (960 ml total). Tube feeding regimen provides 1420 kcal (100% of needs), 60 g protein, and 1684 ml H2O.  RD to continue to monitor for tolerance.  NUTRITION DIAGNOSIS:   Malnutrition related to chronic illness as evidenced by percent weight loss, severe depletion of body fat, severe depletion of muscle mass.  GOAL:   Patient will meet greater than or equal to 90% of their needs  MONITOR:   PO intake, Labs, Weight trends, TF tolerance, I & O's  REASON FOR ASSESSMENT:   Consult Enteral/tube feeding initiation and management  ASSESSMENT:   76 y.o. female with medical history significant of esophageal CA, last chemo in early Jan.  Patient had aspiration / difficulty swallowing water about 2 days ago when she was trying to swallow a pill.  States she usually has most trouble with liquids.  Since then she has had wet sounding cough, SOB, subjective fever and chills.  Symptoms are persistent, nothing makes them better.  Patient in room with daughter at bedside. Pt states her husband usually administers all her feedings. She could not recall the formula she uses. Per chart review, pt is followed by [place] RD, last seen 1/25. Per [place] notes, pt uses Osmolite 1.5 via PEG. [place] has a goal rate of 5 cans of Osmolite 1.5 daily. Pt reports not receiving more than 4 cans a day which meets estimated needs (1420 kcal, 60g protein). Cans are administered every 3 hours beginning at 8am. Pt denies any pain or excessive fullness with bolus feeds PTA.  Pt states she has been snacking on peanut butter crackers PO. Pt does not consume anything else PO. Pt reports receiving 8oz of  water at each bolus feed.   Per chart review, UBW is ~85 lb (last weighed this in May 2017). Pt has lost 15 lb x 9 months, this is 18% wt loss x 9 months which is significant for time frame. Nutrition-Focused physical exam completed. Findings are severe fat depletion, severe muscle depletion, and no edema.   Medications reviewed. Labs reviewed: Low Na  Diet Order:  Diet Heart Room service appropriate? Yes; Fluid consistency: Thin  Skin:  Reviewed, no issues  Last BM:  2/5  Height:   Ht Readings from Last 1 Encounters:  02/04/17 5\' 1"  (1.549 m)    Weight:   Wt Readings from Last 1 Encounters:  02/04/17 70 lb (31.8 kg)    Ideal Body Weight:  47.7 kg  BMI:  Body mass index is 13.23 kg/m.  Estimated Nutritional Needs:   Kcal:  1200-1400  Protein:  55-65g  Fluid:  1.5L/day  EDUCATION NEEDS:   No education needs identified at this time  Clayton Bibles, MS, RD, LDN Pager: [phone] After Hours Pager: [phone]

## 2017-02-04 NOTE — Evaluation (Signed)
Clinical/Bedside Swallow Evaluation Patient Details  Name: Karen Osborn MRN: [id number] Date of Birth: [birth year]  Today's Date: 02/04/2017 Time: SLP Start Time (ACUTE ONLY): 56 SLP Stop Time (ACUTE ONLY): 1133 SLP Time Calculation (min) (ACUTE ONLY): 28 min  Past Medical History:  Past Medical History:  Diagnosis Date  . Breast cancer ([place])    left  . COPD (chronic obstructive pulmonary disease) ([place])   . Dyspnea   . GERD (gastroesophageal reflux disease)    Past Surgical History:  Past Surgical History:  Procedure Laterality Date  . BREAST LUMPECTOMY Left 1/132006  . ESOPHAGOGASTRODUODENOSCOPY (EGD) WITH PROPOFOL N/A 10/24/2016   Procedure: ESOPHAGOGASTRODUODENOSCOPY (EGD) WITH PROPOFOL;  Surgeon: Wilford Corner, MD;  Location: [facility] ENDOSCOPY;  Service: Endoscopy;  Laterality: N/A;  . IR GENERIC HISTORICAL  12/26/2016   IR GASTROSTOMY TUBE MOD SED 12/26/2016 Jacqulynn Cadet, MD [facility]INTER[facility] RAD  . IR GENERIC HISTORICAL  01/17/2017   IR PATIENT EVAL TECH 0-60 MINS [facility]   HPI:  76 y.o.femalewith medical history significant for esophageal CA, last chemo/radiation in early Jan. Patient had aspiration / difficulty swallowing water about 2 days ago when she was trying to swallow a pill. States she usually has most trouble with liquids. Since then she has had wet sounding cough, SOB, subjective fever and chills. Symptoms are persistent, nothing makes them better.  Pt had G-tube placed 12/26/16 for nutritional support.  Per her report, 99% of her nutrition is via PEG; any oral intake is for pleasure only.  She consumes crackers/peanut butter/ice cream in limited quantities.     Assessment / Plan / Recommendation Clinical Impression  Pt presents with post-radiation esophageal dysphagia, with classic symptoms of globus and regurgitation.  Pt receives primary nutrition via PEG.  We discussed the impact of radiation upon the esophagus, including decreased motility, potential  reflux and laryngopharyngeal reflux, the concerns for esophageal accumulation with backflow into larynx/trachea.  Pt derives great pleasure from eating her peanut butter crackers with ice cream.  She was observed consuming these consistencies, as well as water.  Pt self-regulates - takes 1/4 tspn quantities.  She describes intermittent globus but does not appear to have an oropharyngeal dysphagia.  MBS/barium swallow not indicated at this time.  Recommend pt resume POs per her preferences and abilities.  Remain upright 30 min post-POs; use moist foods/liquids to facilitate transfer through esophagus.  Pt/dtr agree - no further SLP f/u is warranted.     Aspiration Risk       Diet Recommendation   regular foods/liquids per pt's preferences  Medication Administration: Via alternative means      Swallow Study   General HPI: 76 y.o.femalewith medical history significant for esophageal CA, last chemo/radiation in early Jan. Patient had aspiration / difficulty swallowing water about 2 days ago when she was trying to swallow a pill. States she usually has most trouble with liquids. Since then she has had wet sounding cough, SOB, subjective fever and chills. Symptoms are persistent, nothing makes them better.  Pt had G-tube placed 12/26/16 for nutritional support.  Per her report, 99% of her nutrition is via PEG; any oral intake is for pleasure only.  She consumes crackers/peanut butter/ice cream in limited quantities.   Type of Study: Bedside Swallow Evaluation Previous Swallow Assessment: no Diet Prior to this Study: Regular;Thin liquids Temperature Spikes Noted: No Respiratory Status: Room air History of Recent Intubation: No Behavior/Cognition: Alert Oral Cavity Assessment: Within Functional Limits Oral Care Completed by SLP: No Oral Cavity - Dentition:  Adequate natural dentition Vision: Functional for self-feeding Self-Feeding Abilities: Able to feed self Patient Positioning: Upright in  bed Baseline Vocal Quality: Normal Volitional Cough: Strong Volitional Swallow: Able to elicit    Oral/Motor/Sensory Function Overall Oral Motor/Sensory Function: Within functional limits   Ice Chips Ice chips: Not tested   Thin Liquid Thin Liquid: Impaired Presentation: Cup Pharyngeal  Phase Impairments: Other (comments) (globus)    Nectar Thick Nectar Thick Liquid: Not tested   Honey Thick Honey Thick Liquid: Not tested   Puree Puree: Within functional limits Presentation: Self Fed;Spoon   Solid   GO   Solid: Impaired Oral Phase Functional Implications: Other (comment) Pharyngeal Phase Impairments: Other (comments) (globus)        Karen Osborn 02/04/2017,11:42 AM

## 2017-02-05 DIAGNOSIS — J189 Pneumonia, unspecified organism: Secondary | ICD-10-CM | POA: Diagnosis not present

## 2017-02-05 DIAGNOSIS — C154 Malignant neoplasm of middle third of esophagus: Secondary | ICD-10-CM | POA: Diagnosis not present

## 2017-02-05 DIAGNOSIS — J690 Pneumonitis due to inhalation of food and vomit: Secondary | ICD-10-CM | POA: Diagnosis not present

## 2017-02-05 DIAGNOSIS — R1310 Dysphagia, unspecified: Secondary | ICD-10-CM | POA: Diagnosis not present

## 2017-02-05 DIAGNOSIS — J449 Chronic obstructive pulmonary disease, unspecified: Secondary | ICD-10-CM | POA: Diagnosis not present

## 2017-02-05 LAB — CBC
HCT: 35.7 % — ABNORMAL LOW (ref 36.0–46.0)
Hemoglobin: 11.7 g/dL — ABNORMAL LOW (ref 12.0–15.0)
MCH: 31.5 pg (ref 26.0–34.0)
MCHC: 32.8 g/dL (ref 30.0–36.0)
MCV: 96.2 fL (ref 78.0–100.0)
PLATELETS: 254 10*3/uL (ref 150–400)
RBC: 3.71 MIL/uL — ABNORMAL LOW (ref 3.87–5.11)
RDW: 16.7 % — AB (ref 11.5–15.5)
WBC: 6 10*3/uL (ref 4.0–10.5)

## 2017-02-05 LAB — BASIC METABOLIC PANEL
Anion gap: 7 (ref 5–15)
BUN: 17 mg/dL (ref 6–20)
CALCIUM: 8.6 mg/dL — AB (ref 8.9–10.3)
CO2: 28 mmol/L (ref 22–32)
Chloride: 100 mmol/L — ABNORMAL LOW (ref 101–111)
Creatinine, Ser: 0.41 mg/dL — ABNORMAL LOW (ref 0.44–1.00)
GFR calc Af Amer: >60 mL/min (ref >60)
GLUCOSE: 89 mg/dL (ref 65–99)
Potassium: 4 mmol/L (ref 3.5–5.1)
Sodium: 135 mmol/L (ref 135–145)

## 2017-02-05 LAB — GLUCOSE, CAPILLARY: Glucose-Capillary: 85 mg/dL (ref 65–99)

## 2017-02-05 LAB — HIV ANTIBODY (ROUTINE TESTING W REFLEX): HIV SCREEN 4TH GENERATION: NONREACTIVE

## 2017-02-05 MED ORDER — OSMOLITE 1.5 CAL PO LIQD: 237.0000 mL | Freq: Every day | ORAL | 0 refills | Status: AC

## 2017-02-05 MED ORDER — AMOXICILLIN-POT CLAVULANATE 250-62.5 MG/5ML PO SUSR
250.0000 mg | Freq: Two times a day (BID) | ORAL | Status: DC
  Filled 2017-02-05: qty 5

## 2017-02-05 MED ORDER — TRAZODONE HCL 50 MG PO TABS: 50.0000 mg | ORAL_TABLET | Freq: Every evening | ORAL | 0 refills | Status: DC | PRN

## 2017-02-05 MED ORDER — AMOXICILLIN-POT CLAVULANATE 250-62.5 MG/5ML PO SUSR: 250.0000 mg | Freq: Two times a day (BID) | ORAL | 0 refills | Status: DC

## 2017-02-05 NOTE — Discharge Summary (Addendum)
Physician Discharge Summary  Karen Osborn [id number] DOB: [birth year] DOA: 02/03/2017  PCP: Judge Stall, FNP  Admit date: 02/03/2017 Discharge date: 02/05/2017  Time spent: 45 minutes  Recommendations for Outpatient Follow-up:  Patient will be discharged to home with oxygen, hospital bed, 3-in1.  Patient will need to follow up with primary care provider within one week of discharge.  Follow with Dr. Benay Spice as needed. Patient should continue medications as prescribed.  Patient should follow aspiration precautions when eating.  Continue tube feeds.   Discharge Diagnoses:  Aspiration pneumonia COPD Esophageal cancer Severe malnutrition  Discharge Condition: Stable  Diet recommendation: Tube feeds  Filed Weights   02/04/17 0520 02/05/17 [id number]  Weight: 31.8 kg (70 lb) 29 kg (63 lb 14.9 oz)    History of present illness:  on 02/03/2017 by Dr. Leonette Most McCollumis a 76 y.o.femalewith medical history significant of esophageal CA, last chemo in early Jan. Patient had aspiration / difficulty swallowing water about 2 days ago when she was trying to swallow a pill. States she usually has most trouble with liquids. Since then she has had wet sounding cough, SOB, subjective fever and chills. Symptoms are persistent, nothing makes them better.  Hospital Course:  Aspiration pneumonia -Patient does have esophageal cancer and does have PEG tube in place. She has had difficulty swallowing. Approximate 2 days ago, she attempted to swallow a stool softener with water and "got choked up" -CTA chest shoed no definite evidence of PE, abnormal density in lower lobe bronchi B/L-?mucous plugging or aspirated material -Speech therapy consulted and appreciated. Recommended regular foods/liquids per pt's preferences -Initially placed on azithromycin and ceftriaxone -Given aspiration, placed patient on Unasyn. Will discharge patient with Augmentin suspension for additional 6 days (7  days total of antibiotics) -Strep pneumonia urine antigen negative  -Explained to patient that aspiration could continue to occur and lead to pneumonia.  She needs to follow precautions and only take in small quantities. -Will order hospital bed for patient given her risk of aspiration    Acute hypoxia  -SpO2 fell to 87% on room air at rest -Will send home on 2L   COPD -Currently stable, no wheezing noted today -Continue albuterol neb PRN  Esophageal cancer -Status post chemotherapy and radiation therapy -Dr. Benay Spice, oncology, made aware of patient's admission  Severe malnutrition -In the context of chronic illness -Nutrition consultation appreciated, continue tube feeds -Megace currently held- continue upon discharge  Insomnia -Trazodone script given -Discuss with PCP  Consultants Oncology, Dr. Benay Spice  Procedures  None  Discharge Exam: Vitals:   02/05/17 0000 02/05/17 0608  BP:  (!) 98/55  Pulse: 90 89  Resp:  20  Temp:  98.4 F (36.9 C)   She continues to have cough. Denies shortness of breath, chest pain, abdominal pain, N/V/D/C. States she would like to go home.   Exam  General: Well developed, thin, elderly,Chronically ill-appearing, NAD  HEENT: NCAT, mucous membranes moist.   Cardiovascular: S1 S2 auscultated, no rubs, murmurs or gallops. Regular rate and rhythm.  Respiratory: Diminished but clear, no wheezing or rhonchi  Abdomen: Soft, nontender, nondistended, + bowel sounds, peg tube in place  Extremities: warm dry without cyanosis clubbing or edema  Neuro: AAOx3, nonfocal  Psych: Normal affect and demeanor  Discharge Instructions Discharge Instructions    Discharge instructions    Complete by:  As directed    Patient will be discharged to home.  Patient will need to follow up with primary care provider within one  week of discharge.  Follow with Dr. Benay Spice as needed. Patient should continue medications as prescribed.  Patient should  follow aspiration precautions when eating.  Continue tube feeds.     Current Discharge Medication List    START taking these medications   Details  amoxicillin-clavulanate (AUGMENTIN) 250-62.5 MG/5ML suspension Take 5 mLs (250 mg total) by mouth every 12 (twelve) hours. Qty: 150 mL, Refills: 0    Nutritional Supplements (FEEDING SUPPLEMENT, OSMOLITE 1.5 CAL,) LIQD Place 237 mLs into feeding tube 5 (five) times daily. Qty: 1000 mL, Refills: 0    traZODone (DESYREL) 50 MG tablet Take 1 tablet (50 mg total) by mouth at bedtime as needed for sleep. Qty: 10 tablet, Refills: 0      CONTINUE these medications which have NOT CHANGED   Details  albuterol (PROAIR HFA) 108 (90 Base) MCG/ACT inhaler 2 puffs every 4 hours as needed only  if your can't catch your breath Qty: 1 Inhaler, Refills: 11   Associated Diagnoses: COPD mixed type (HCC)    megestrol (MEGACE) 400 MG/10ML suspension Take 5 mLs (200 mg total) by mouth 2 (two) times daily. Qty: 240 mL, Refills: 1    acetaminophen (TYLENOL) 500 MG tablet Take 500 mg by mouth every 6 (six) hours as needed for moderate pain.     magnesium hydroxide (PHILLIPS MILK OF MAGNESIA) 400 MG/5ML suspension Take 30 mLs by mouth daily as needed for mild constipation.    polyethylene glycol (MIRALAX / GLYCOLAX) packet Take 17 g by mouth daily as needed for moderate constipation.       Allergies  Allergen Reactions  . Benadryl [Diphenhydramine Hcl] Other (See Comments)    Jittery, patient states "legs bouncing up and down"   . Codeine Nausea And Vomiting   Follow-up Information    STOCK, Fara Chute, FNP. Schedule an appointment as soon as possible for a visit in 1 week(s).   Specialty:  Family Medicine Why:  Hospital follow up Contact information: [place] [address] [phone]        Betsy Coder, MD. Go to.   Specialty:  Oncology Why:  As needed Contact information: [place]  [address] [phone]            The results of significant diagnostics from this hospitalization (including imaging, microbiology, ancillary and laboratory) are listed below for reference.    Significant Diagnostic Studies: Dg Chest 2 View  Result Date: 02/03/2017 CLINICAL DATA:  Three days of coughing and shortness of breath with fever and chills. The patient is undergoing chemotherapy for esophageal malignancy. History of COPD and previous left breast malignancy. EXAM: CHEST  2 VIEW COMPARISON:  Chest x-ray of April 24, 2016 FINDINGS: The lungs remain hyperinflated and hyperlucent. There is no alveolar infiltrate. There is no pleural effusion. No pulmonary parenchymal nodules or masses are observed. The heart and pulmonary vascularity are normal. The mediastinum is normal in width. There is calcification in the wall of the aortic arch. The bony thorax exhibits no acute abnormality. IMPRESSION: Marked hyperinflation consistent with COPD. No acute pneumonia nor CHF. No evidence of metastatic disease. Thoracic aortic atherosclerosis. Electronically Signed   By: David  Martinique M.D.   On: 02/03/2017 16:36   Ct Angio Chest Pe W And/or Wo Contrast  Result Date: 02/03/2017 CLINICAL DATA:  Shortness of breath, cough. History of esophageal cancer. EXAM: CT ANGIOGRAPHY CHEST WITH CONTRAST TECHNIQUE: Multidetector CT imaging of the chest was performed using the standard protocol  during bolus administration of intravenous contrast. Multiplanar CT image reconstructions and MIPs were obtained to evaluate the vascular anatomy. CONTRAST:  55 mL of Isovue 370 intravenously. COMPARISON:  CT scan of October 29, 2016. FINDINGS: Cardiovascular: Atherosclerosis of thoracic aorta is noted without aneurysm or dissection. There is no definite evidence of pulmonary embolus. Mediastinum/Nodes: No mediastinal adenopathy is noted. There is thickening of the middle and distal portions of the esophagus consistent with a history of  esophageal cancer. Thyroid gland is unremarkable. Lungs/Pleura: Mild emphysematous disease is noted in the upper lobes bilaterally. Abnormal density is seen in the lower lobe bronchi bilaterally suggesting mucous plugging or aspirated material. No pneumothorax or pleural effusion is noted. Upper Abdomen: No acute abnormality. Musculoskeletal: No chest wall abnormality. No acute or significant osseous findings. Review of the MIP images confirms the above findings. IMPRESSION: Aortic atherosclerosis. No definite evidence of pulmonary embolus. Thickening of the middle and distal portions of the esophagus consistent with history of esophageal cancer. Mild emphysematous disease is noted in the upper lobes bilaterally. Abnormal density is noted within the lower lobe bronchi bilaterally suggesting mucous plugging or aspirated material. Electronically Signed   By: Marijo Conception, M.D.   On: 02/03/2017 16:54   Ir Patient Eval Tech 0-60 Mins  Result Date: 01/17/2017 Chipper Oman     01/17/2017  4:22 PM Patient came in with complaint of severe pain at G tube insertion site.  After evaluating the site, it was determined that the g tube retention bumper was too tight.  The bumper was retracted and repositioned with the final placement at marker level 3.  The patient was able to move and cough without the previous pain.  She was happy and said her pain issues were resolved.  She did inform me that Dr Benay Spice had called in a prescription for antibiotics anticipating the g tube was infected.  Keven Allred PAC evaluated the site and determined that it was not infected and advised the patient the antibiotics were not necessary.  The patient had no other concerns for myself or Lennette Bihari.  She was advised to call if she has any further issues.   Microbiology: Recent Results (from the past 240 hour(s))  Blood culture (routine x 2)     Status: None (Preliminary result)   Collection Time: 02/03/17  8:05 PM  Result Value Ref Range  Status   Specimen Description BLOOD BLOOD LEFT FOREARM  Final   Special Requests BOTTLES DRAWN AEROBIC AND ANAEROBIC 5CC  Final   Culture   Final    NO GROWTH < 24 HOURS Performed at [facility], [place] [address], [place], [place] [address]    Report Status PENDING  Incomplete  Blood culture (routine x 2)     Status: None (Preliminary result)   Collection Time: 02/03/17  8:05 PM  Result Value Ref Range Status   Specimen Description BLOOD LEFT ANTECUBITAL  Final   Special Requests BOTTLES DRAWN AEROBIC AND ANAEROBIC 5CC  Final   Culture   Final    NO GROWTH < 24 HOURS Performed at [facility], [place] [address], [place], [place] [address]    Report Status PENDING  Incomplete     Labs: Basic Metabolic Panel:  Recent Labs Lab 02/03/17 1540 02/05/17 0502  NA 130* 135  K 4.0 4.0  CL 94* 100*  CO2 28 28  GLUCOSE 139* 89  BUN 27* 17  CREATININE 0.36* 0.41*  CALCIUM 8.7* 8.6*   Liver  Function Tests:  Recent Labs Lab 02/03/17 1540  AST 20  ALT 20  ALKPHOS 67  BILITOT 0.6  PROT 6.4*  ALBUMIN 3.6   No results for input(s): LIPASE, AMYLASE in the last 168 hours. No results for input(s): AMMONIA in the last 168 hours. CBC:  Recent Labs Lab 02/03/17 1540 02/05/17 0502  WBC 7.7 6.0  NEUTROABS 6.6  --   HGB 12.4 11.7*  HCT 37.5 35.7*  MCV 96.4 96.2  PLT 288 254   Cardiac Enzymes: No results for input(s): CKTOTAL, CKMB, CKMBINDEX, TROPONINI in the last 168 hours. BNP: BNP (last 3 results) No results for input(s): BNP in the last 8760 hours.  ProBNP (last 3 results) No results for input(s): PROBNP in the last 8760 hours.  CBG:  Recent Labs Lab 02/04/17 1207 02/04/17 1619 02/04/17 2125 02/04/17 2329 02/05/17 0617  GLUCAP 120* 156* 101* 104* 85       Signed:  Jonnelle Lawniczak  [facility] 02/05/2017, 12:30 PM

## 2017-02-05 NOTE — Progress Notes (Signed)
Chaplain stopped by the room not realizing the patient was being discharged.  The patient was so nice to share about the wonderful care she has received and she also expressed that she is happy to go home no matter how hard it is raining outside.  We chatted just a moment about the rain.  Patient thanked me for stopping by.  Dorna Bloom Resident Pager [phone]   02/05/17 1400  Clinical Encounter Type  Visited With Patient

## 2017-02-05 NOTE — Progress Notes (Signed)
DME Oxygen (portable tank) and 3n1 delivered to room, spoke with patient and her husband and they are fine with discharging hospital before delivery of hospital bed to their home and verbalize understanding that [facility] will contact them with delivery date and time

## 2017-02-05 NOTE — Care Management Note (Signed)
Case Management Note  Patient Details  Name: Karen Osborn MRN: [id number] Date of Birth: 21-Sep-1941  Subjective/Objective: 76 y/o f admitted w/Asp pna. Hx: Esophageal Ca. From home. Qualified for home 02-[facility] dme rep [place]-aware of sats, order,d/c & to deliver home 02 to rm prior d/c.Received call from nurse to discuss d/c plans w/patient/spouse. Spoke to spouse/patient in rm about d/c plans-spouse was concerned about d/c today since  weather outside-raining & getting her home, the need for a potty chair, & hospital bed since she uses a recliner to sleep. Explained that the doctor has placed a d/c order-medically stable for d/c,the insurance will pay for medical reasons for an acute hospital stay, not for non acute(weather,social issues) for a hospital stay. MD notified-Md ordered hospital bed,& 3n1-[facility] rep Kim aware of dme orders-will talk to spouse for set up, & delivery to home.  MD did call into rm to discuss these concerns w/patient.  Patient/spouse voiced understanding. No further CM needs.                  Action/Plan:   Expected Discharge Date:  02/05/17               Expected Discharge Plan:  Home/Self Care  In-House Referral:     Discharge planning Services  CM Consult  Post Acute Care Choice:    Choice offered to:  Patient  DME Arranged:  3-N-1, Hospital bed, Oxygen DME Agency:  [place]:    [facility] Agency:     Status of Service:  Completed, signed off  If discussed at [place] of Stay Meetings, dates discussed:    Additional Comments:  Dessa Phi, RN 02/05/2017, 2:02 PM

## 2017-02-05 NOTE — Progress Notes (Signed)
SATURATION QUALIFICATIONS:   Patient Saturations on Room Air at Rest = 87 %  Patient Saturations on Room Air while Ambulating = did not ambulate pt  Patient Saturations on 2 Liters of oxygen while at rest = 98%  Please briefly explain why patient needs home oxygen: assessed patient in bed while resting Oxygen 87 % on room air. 2 liters oxygen applied saturation increased 98%.

## 2017-02-05 NOTE — Telephone Encounter (Signed)
Called and left message with pt MD wants to r/s appt from 2/8 to 2-3 weeks. Scheduling will call with appt.  scheduling message sent

## 2017-02-05 NOTE — Progress Notes (Signed)
Patient is a&ox4 and ambulatory with assist.No change in am assessment. Patient has been using bedside commode. Equipment delivered, O2 and BSC. Discharge instructions reviewed with pt & spouse. Additional education provided regarding risk for aspiration and s&s to be aware of. Questions concerns denied.

## 2017-02-05 NOTE — Discharge Instructions (Signed)
Aspiration Pneumonia Aspiration pneumonia is an infection in your lungs. It occurs when food, liquid, or stomach contents (vomit) are inhaled (aspirated) into your lungs. When these things get into your lungs, swelling (inflammation) and infection can occur. This can make it difficult for you to breathe. Aspiration pneumonia is a serious condition and can be life threatening. What increases the risk? Aspiration pneumonia is more likely to occur when a person's cough (gag) reflex or ability to swallow has been decreased. Some things that can do this include:  Having a brain injury or disease, such as stroke, seizures, Parkinson's disease, dementia, or amyotrophic lateral sclerosis (ALS).  Being given general anesthetic for procedures.  Being in a coma (unconscious).  Having a narrowing of the tube that carries food to the stomach (esophagus).  Drinking too much alcohol. If a person passes out and vomits, vomit can be swallowed into the lungs.  Taking certain medicines, such as tranquilizers or sedatives. What are the signs or symptoms?  Coughing after swallowing food or liquids.  Breathing problems, such as wheezing or shortness of breath.  Bluish skin. This can be caused by lack of oxygen.  Coughing up food or mucus. The mucus might contain blood, greenish material, or yellowish-white fluid (pus).  Fever.  Chest pain.  Being more tired than usual (fatigue).  Sweating more than usual.  Bad breath. How is this diagnosed? A physical exam will be done. During the exam, the health care provider will listen to your lungs with a stethoscope to check for:  Crackling sounds in the lungs.  Decreased breath sounds.  A rapid heartbeat. Various tests may be ordered. These may include:  Chest X-ray.  CT scan.  Swallowing study. This test looks at how food is swallowed and whether it goes into your breathing tube (trachea) or food pipe (esophagus).  Sputum culture. Saliva and  mucus (sputum) are collected from the lungs or the tubes that carry air to the lungs (bronchi). The sputum is then tested for bacteria.  Bronchoscopy. This test uses a flexible tube (bronchoscope) to see inside the lungs. How is this treated? Treatment will usually include antibiotic medicines. Other medicines may also be used to reduce fever or pain. You may need to be treated in the hospital. In the hospital, your breathing will be carefully monitored. Depending on how well you are breathing, you may need to be given oxygen, or you may need breathing support from a breathing machine (ventilator). For people who fail a swallowing study, a feeding tube might be placed in the stomach, or they may be asked to avoid certain food textures or liquids when they eat. Follow these instructions at home:  Carefully follow any special eating instructions you were given, such as avoiding certain food textures or thickening liquids. This reduces the risk of developing aspiration pneumonia again.  Only take over-the-counter or prescription medicines as directed by your health care provider. Follow the directions carefully.  If you were prescribed antibiotics, take them as directed. Finish them even if you start to feel better.  Rest as instructed by your health care provider.  Keep all follow-up appointments with your health care provider. Contact a health care provider if:  You develop worsening shortness of breath, wheezing, or difficulty breathing.  You develop a fever.  You have chest pain. This information is not intended to replace advice given to you by your health care provider. Make sure you discuss any questions you have with your health care provider. Document Released:  10/13/2009 Document Revised: 05/29/2016 Document Reviewed: 06/03/2013 Elsevier Interactive Patient Education  2017 [employer] Inc[employer] Aspiration Precautions, Adult Aspiration is the breathing in (inhalation) of a liquid or object  into the lungs. Things that can be inhaled into the lungs include:  Food.  Any type of liquid, such as drinks or saliva.  Stomach contents, such as vomit or stomach acid. What are the signs of aspiration? Signs of aspiration include:  Coughing after swallowing food or liquids.  Clearing the throat often while eating.  Trouble breathing. This may include:  Breathing quickly.  Breathing very slowly.  Loud breathing.  Rumbling sounds from the lungs while breathing.  Coughing up phlegm (sputum) that:  Is yellow, tan, or green.  Has pieces of food in it.  Is bad-smelling.  Having a hoarse, barky cough.  Not being able to speak.  A hoarse voice.  Drooling while eating.  A feeling of fullness in the throat or a feeling that something is stuck in the throat.  Choking often.  Having a runny noise while eating.  Coughing when lying down or having to sit up quickly after lying down.  A change in skin color. The skin may look red or blue.  Fever.  Watery eyes.  Pain in the chest or back.  A pained look on the face. What are the complications of aspiration? Complications of aspiration include:  Losing weight because the person is not absorbing needed nutrients.  Loss of enjoyment and the social benefits of eating.  Choking.  Lung irritation, if someone aspirates acidic food or drinks.  Lung infection (pneumonia).  Collection of infected liquid (pus) in the lungs (lung abscess). In serious cases, death can occur. What can I do to prevent aspiration? Caring for someone who has a feeding tube If you are caring for someone who has a feeding tube who cannot eat or drink safely through his or her mouth:  Keep the person in an upright position as much as possible.  Do not lay the person flat if he or she is getting continuous feedings. If you need to lay the person flat for any reason, turn the feeding pump off.  Check feeding tube residuals as told by your  health care provider. Ask your health care provider what residual amount is too high. Caring for someone who can eat and drink safely by mouth If you are caring for someone who can eat and drink safely through his or her mouth:  Have the person sit in an upright position when eating food or drinking fluids. This can be done in two ways:  Have the person sit up in a chair.  If sitting in a chair is not possible, position the person in bed so he or she is upright.  Remind the person to eat slowly and chew well. Make sure the person is awake and alert while eating.  Do not distract the person. This is especially important for people with thinking or memory (cognitive) problems.  Allow foods to cool. Hot foods may be more difficult to swallow.  Provide small meals more frequently, instead of 3 large meals. This may reduce fatigue during eating.  Check the person's mouth thoroughly for leftover food after eating.  Keep the person sitting upright for 30-45 minutes after eating.  Do not serve food or drink during 2 hours or more before bedtime. General instructions Follow these general guidelines to prevent aspiration in someone who can eat and drink safely by mouth:  Never put  food or liquids in the mouth of a person who is not fully alert.  Feed small amounts of food. Do not force feed.  For a person who is on a diet for swallowing difficulty (dysphagia diet), follow the recommended food and drink consistency. For example, in dysphagia diet level 1, thicken liquids to pudding-like consistency.  Use as little water as possible when brushing the person's teeth or cleaning his or her mouth.  Provide oral care before and after meals.  Use adaptive devices such as cut-out cups, straws, or utensils as told by the health care provider.  Crush pills and put them in soft food such as pudding or ice cream. Some pills should not be crushed. Check with the health care provider before crushing  any medicine. Contact a health care provider if:  The person has a feeding tube, and the feeding tube residual amount is too high.  The person has a fever.  The person tries to avoid food or water, such as refusing to eat, drink, or be fed, or is eating less than normal.  The person may have aspirated food or liquid.  You notice warning signs, such as choking or coughing, when the person eats or drinks. Get help right away if:  The person has trouble breathing or starts to breathe quickly.  The person is breathing very slowly or stops breathing.  The person coughs a lot after eating or drinking.  The person has a long-lasting (chronic) cough.  The person coughs up thick, yellow, or tan sputum.  If someone is choking on food or an object, perform the Heimlich maneuver (abdominal thrusts).  The person has symptoms of pneumonia, such as:  Coughing a lot.  Coughing up mucus with a bad smell or blood in it.  Feeling short of breath.  Complaining of chest pain.  Sweating, fever, and chills.  Feeling tired.  Complaining of trouble breathing.  Wheezing.  The person cannot stop choking.  The person is unable to breathe, turns blue, faints, or seems confused. These symptoms may represent a serious problem that is an emergency. Do not wait to see if the symptoms will go away. Get medical help right away. Call your local emergency services (911 in the [place]  Summary  Aspiration is the breathing in (inhalation) of a liquid or object into the lungs. Things that can be inhaled into the lungs include food, liquids, saliva, or stomach contents.  Aspiration can cause pneumonia or choking.  One sign of aspiration is coughing after swallowing food or liquids.  Contact a health care provider if you notice signs of aspiration. This information is not intended to replace advice given to you by your health care provider. Make sure you discuss any questions you have with your health  care provider. Document Released: 01/18/2011 Document Revised: 09/12/2016 Document Reviewed: 09/12/2016 Elsevier Interactive Patient Education  2017 [employer].

## 2017-02-05 NOTE — Care Management Note (Signed)
Case Management Note  Patient Details  Name: Karen Osborn MRN: [id number] Date of Birth: 1941/10/02  Subjective/Objective:75 y/o f admitted w/asp pna. From home.                    Action/Plan:d/c home.   Expected Discharge Date:  02/05/17               Expected Discharge Plan:  Home/Self Care  In-House Referral:     Discharge planning Services  CM Consult  Post Acute Care Choice:    Choice offered to:     DME Arranged:    DME Agency:     HH Arranged:    HH Agency:     Status of Service:  Completed, signed off  If discussed at [employer] of Stay Meetings, dates discussed:    Additional Comments:  Dessa Phi, RN 02/05/2017, 12:03 PM

## 2017-02-05 NOTE — Progress Notes (Addendum)
Mrs. Karen Osborn 76 y.o. woman with Malignant neoplasm of middle third of esophagus radiation completed 01-03-17  one month FU.  Pain:No, just feels bad all over Skin:Normal to chest Nausea/Vomiting:Yes Swallowing problems/pain/swallowing dificulity: Some swallowing problems with food and liquids.  Feels like her throat is closing up. Appetite:Poor    Osmolite 1.5 to one bottle 4 times a day with 90 cc free water before and after bolus feedings.  To  See Ernestene Kiel today. Weight: Wt Readings from Last 3 Encounters:  02/12/17 73 lb (33.1 kg)  02/09/17 68 lb (30.8 kg)  02/05/17 63 lb 14.9 oz (29 kg)                                                                      Fatigue:Having fatigue all of the time. Had recent pneumonia had had antibiotic which she completed on yesterday zithromax afebrile at 98.2 BP 110/82   Pulse 95   Temp 98.2 F (36.8 C) (Oral)   Resp 16   Ht 5\' 1"  (1.549 m)   Wt 73 lb (33.1 kg)   SpO2 97%   BMI 13.79 kg/m

## 2017-02-06 ENCOUNTER — Telehealth: Payer: Self-pay | Admitting: *Deleted

## 2017-02-06 ENCOUNTER — Ambulatory Visit: Payer: Medicare Other | Admitting: Oncology

## 2017-02-06 NOTE — Telephone Encounter (Signed)
Verbal order received and read back from Dr. Benay Spice for patient to contact PCP for evaluation.  Returned call to daughter Museum/gallery conservator.  Order/Instructions given to [employer] at this time.  "I don't have their number.  Is this doctor in [place]?  Not sure when they saw her last.  They did not prescribe the antibiotic, should we call the doctor in the hospital?  (No call Primary Care.)  Do you know this is not going to go over well with my Dad.  (I'll be glad to call your Dad to provide the information.)  They don't know she has a feeding tube or anything but I'll start with them."   Dr. Doug Sou number provided with this call.  No further questions.

## 2017-02-06 NOTE — Telephone Encounter (Signed)
"  This is [employer] calling with concern for my mother.  Need to know what to do.  Discharged from Hospital, aspiration pneumonia.  At 8:00 pm Dad gave the antibiotic through the tube.  (Augmentin susp)  She vomited and diarrhea all night.  It looked like the medicine.  This medicine is not agreeing well with her.  She is down to 68 lbs, has esophageal cancer.  Please mark this urgent.  My return number [phone].

## 2017-02-08 LAB — CULTURE, BLOOD (ROUTINE X 2)
CULTURE: NO GROWTH
Culture: NO GROWTH

## 2017-02-09 ENCOUNTER — Encounter (HOSPITAL_COMMUNITY): Payer: Self-pay

## 2017-02-09 ENCOUNTER — Emergency Department (HOSPITAL_COMMUNITY): Payer: Medicare Other

## 2017-02-09 ENCOUNTER — Emergency Department (HOSPITAL_COMMUNITY)
Admission: EM | Admit: 2017-02-09 | Discharge: 2017-02-09 | Disposition: A | Discharge status: Home / Self Care | Payer: Medicare Other | Attending: Emergency Medicine | Admitting: Emergency Medicine

## 2017-02-09 DIAGNOSIS — Z87891 Personal history of nicotine dependence: Secondary | ICD-10-CM | POA: Diagnosis not present

## 2017-02-09 DIAGNOSIS — Z853 Personal history of malignant neoplasm of breast: Secondary | ICD-10-CM | POA: Insufficient documentation

## 2017-02-09 DIAGNOSIS — Z79899 Other long term (current) drug therapy: Secondary | ICD-10-CM | POA: Insufficient documentation

## 2017-02-09 DIAGNOSIS — E860 Dehydration: Secondary | ICD-10-CM

## 2017-02-09 DIAGNOSIS — Z8501 Personal history of malignant neoplasm of esophagus: Secondary | ICD-10-CM | POA: Insufficient documentation

## 2017-02-09 DIAGNOSIS — J449 Chronic obstructive pulmonary disease, unspecified: Secondary | ICD-10-CM | POA: Insufficient documentation

## 2017-02-09 DIAGNOSIS — R531 Weakness: Secondary | ICD-10-CM | POA: Diagnosis present

## 2017-02-09 LAB — COMPREHENSIVE METABOLIC PANEL
ALBUMIN: 3.6 g/dL (ref 3.5–5.0)
ALK PHOS: 70 U/L (ref 38–126)
ALT: 27 U/L (ref 14–54)
AST: 27 U/L (ref 15–41)
Anion gap: 8 (ref 5–15)
BUN: 16 mg/dL (ref 6–20)
CALCIUM: 8.6 mg/dL — AB (ref 8.9–10.3)
CO2: 28 mmol/L (ref 22–32)
CREATININE: 0.37 mg/dL — AB (ref 0.44–1.00)
Chloride: 93 mmol/L — ABNORMAL LOW (ref 101–111)
GFR calc Af Amer: >60 mL/min (ref >60)
GLUCOSE: 132 mg/dL — AB (ref 65–99)
Potassium: 3.5 mmol/L (ref 3.5–5.1)
Sodium: 129 mmol/L — ABNORMAL LOW (ref 135–145)
TOTAL PROTEIN: 6.3 g/dL — AB (ref 6.5–8.1)

## 2017-02-09 LAB — I-STAT TROPONIN, ED: TROPONIN I, POC: 0.01 ng/mL (ref 0.00–0.08)

## 2017-02-09 LAB — CBC
HCT: 40.2 % (ref 36.0–46.0)
Hemoglobin: 13.5 g/dL (ref 12.0–15.0)
MCH: 32.4 pg (ref 26.0–34.0)
MCHC: 33.6 g/dL (ref 30.0–36.0)
MCV: 96.4 fL (ref 78.0–100.0)
PLATELETS: 242 10*3/uL (ref 150–400)
RBC: 4.17 MIL/uL (ref 3.87–5.11)
RDW: 15.8 % — AB (ref 11.5–15.5)
WBC: 5.4 10*3/uL (ref 4.0–10.5)

## 2017-02-09 LAB — D-DIMER, QUANTITATIVE (NOT AT ARMC): D DIMER QUANT: 0.48 ug{FEU}/mL (ref 0.00–0.50)

## 2017-02-09 LAB — LIPASE, BLOOD: Lipase: 41 U/L (ref 11–51)

## 2017-02-09 MED ORDER — ONDANSETRON HCL 4 MG/2ML IJ SOLN
4.0000 mg | Freq: Once | INTRAMUSCULAR | Status: AC
  Administered 2017-02-09: 4 mg via INTRAVENOUS
  Filled 2017-02-09: qty 2

## 2017-02-09 MED ORDER — SODIUM CHLORIDE 0.9 % IV BOLUS (SEPSIS)
1000.0000 mL | Freq: Once | INTRAVENOUS | Status: AC
Start: 2017-02-09 — End: 2017-02-09
  Administered 2017-02-09: 1000 mL via INTRAVENOUS

## 2017-02-09 NOTE — ED Notes (Signed)
Patient provided with warm blankets.

## 2017-02-09 NOTE — Discharge Instructions (Signed)
The test today indicates only mild dehydration.  We recommend that you double your water intake, for 2 days. After that cut back to one and a half times her usual intake for 3 days, followed by normal intake, on the sixth day.  Continue to take your usual medications.  Return here, if needed, for problems.

## 2017-02-09 NOTE — ED Triage Notes (Addendum)
PT C/O SOB X3-4 DAYS, BUT TODAY HAS BEEN THE WORST. PT HAS A HX OF ESOPHAGEAL CA, AND HAS FINISHED CHEMO/RADIATION X 3 WEEKS AGO. PT STS SHE WAS PLACED ON HOME O2 ON Wednesday AT 2L. PT ALSO C/O DIARRHEA X 1 WEEK. PT STS THE DIARRHEA STARTED AFTER TAKING A COURSE OF ZITHROMAX.

## 2017-02-09 NOTE — ED Provider Notes (Signed)
[facility] DEPT Provider Note   CSN: [id number] Arrival date & time: 02/09/17  1122     History   Chief Complaint Chief Complaint  Patient presents with  . Shortness of Breath  . Diarrhea    HPI Karen Osborn is a 76 y.o. female.  She presents for evaluation of persistent symptoms of cough, and weakness, following recent diagnosis with pneumonia. She was discharged from the hospitalist service, several days ago, on an antibiotic. Her PCP changed it because the antibiotic was causing her stomach discomfort. Since starting the new antibiotic, she has developed diarrhea. She still able to get intake, because she is on a feeding tube, for both medications, and nutrition. She denies headache, neck pain, back pain or chest pain. No documented fever since leaving the hospital. There are no other known modifying factors.  HPI  Past Medical History:  Diagnosis Date  . Breast cancer ([place])    left  . COPD (chronic obstructive pulmonary disease) ([place])   . Dyspnea   . GERD (gastroesophageal reflux disease)     Patient Active Problem List   Diagnosis Date Noted  . Aspiration pneumonia ([place]) 02/03/2017  . Pneumonia 02/03/2017  . Malignant neoplasm of middle third of esophagus ([place]) 10/30/2016  . Difficulty in swallowing 10/24/2016  . COPD  GOLD III still smoking  04/24/2016  . Cigarette smoker 04/24/2016  . Breast cancer ([place]) 02/18/2013  . 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 BREAST LUMPECTOMY Left 1/132006  . ESOPHAGOGASTRODUODENOSCOPY (EGD) WITH PROPOFOL N/A 10/24/2016   Procedure: ESOPHAGOGASTRODUODENOSCOPY (EGD) WITH PROPOFOL;  Surgeon: Wilford Corner, MD;  Location: [facility] ENDOSCOPY;  Service: Endoscopy;  Laterality: N/A;  . IR GENERIC HISTORICAL  12/26/2016   IR GASTROSTOMY TUBE MOD SED 12/26/2016 Jacqulynn Cadet, MD [facility]INTER[facility] RAD  . IR GENERIC HISTORICAL  01/17/2017   IR PATIENT EVAL TECH 0-60 MINS [facility]    OB  History    No data available       Home Medications    Prior to Admission medications   Medication Sig Start Date End Date Taking? Authorizing Provider  acetaminophen (TYLENOL) 160 MG/5ML solution Take 480 mg by mouth every 6 (six) hours as needed for moderate pain.    Yes Historical Provider, MD  azithromycin (ZITHROMAX) 200 MG/5ML suspension Take 12 mLs by mouth daily. Take 69ml once on day 1, then take 61ml once daily on days 2 through 5. 02/06/17  Yes Historical Provider, MD  Lactobacillus (PROBIOTIC ACIDOPHILUS PO) Take 1 tablet by mouth daily.   Yes Historical Provider, MD  Nutritional Supplements (FEEDING SUPPLEMENT, OSMOLITE 1.5 CAL,) LIQD Place 237 mLs into feeding tube 5 (five) times daily. 02/05/17  Yes Maryann Mikhail, DO  prochlorperazine (COMPAZINE) 5 MG tablet Take 5 mg by mouth every 6 (six) hours as needed for nausea/vomiting. 01/10/17  Yes Historical Provider, MD  traZODone (DESYREL) 50 MG tablet Take 1 tablet (50 mg total) by mouth at bedtime as needed for sleep. 02/05/17  Yes Maryann Mikhail, DO  albuterol (PROAIR HFA) 108 (90 Base) MCG/ACT inhaler 2 puffs every 4 hours as needed only  if your can't catch your breath Patient not taking: Reported on 02/09/2017 09/26/16   Tanda Rockers, MD    Family History Family History  Problem Relation Age of Onset  . Heart attack Mother   . Heart attack Father   . Arrhythmia Sister   . Cancer Sister     Breast Cancer  . COPD  Brother   . Cancer Sister     Skin Cancer, Breast Cancer    Social History Social History  Substance Use Topics  . Smoking status: Former Smoker    Packs/day: 0.50    Years: 54.00    Types: Cigarettes  . Smokeless tobacco: Never Used  . Alcohol use No     Allergies   Amoxicillin; Benadryl [diphenhydramine hcl]; and Codeine   Review of Systems Review of Systems  All other systems reviewed and are negative.    Physical Exam Updated Vital Signs BP 106/61 (BP Location: Left Arm)   Pulse 86    Temp 97.6 F (36.4 C) (Oral)   Resp (!) 28   Ht 5\' 1"  (1.549 m)   Wt 68 lb (30.8 kg)   SpO2 90%   BMI 12.85 kg/m   Physical Exam  Constitutional: She is oriented to person, place, and time. She appears well-developed.  Frail, elderly female  HENT:  Head: Normocephalic and atraumatic.  Eyes: Conjunctivae and EOM are normal. Pupils are equal, round, and reactive to light.  Neck: Normal range of motion and phonation normal. Neck supple.  Cardiovascular: Normal rate and regular rhythm.   Pulmonary/Chest: Effort normal and breath sounds normal. No respiratory distress. She has no wheezes. She exhibits no tenderness.  Abdominal: Soft. She exhibits no distension. There is no tenderness (Diffuse, mild). There is no guarding.  Feeding tube left upper quadrant, site, and appliance appear normal.  Musculoskeletal: Normal range of motion.  Neurological: She is alert and oriented to person, place, and time. She exhibits normal muscle tone.  Skin: Skin is warm and dry.  Psychiatric: She has a normal mood and affect. Her behavior is normal.  Nursing note and vitals reviewed.    ED Treatments / Results  Labs (all labs ordered are listed, but only abnormal results are displayed) Labs Reviewed  COMPREHENSIVE METABOLIC PANEL - Abnormal; Notable for the following:       Result Value   Sodium 129 (*)    Chloride 93 (*)    Glucose, Bld 132 (*)    Creatinine, Ser 0.37 (*)    Calcium 8.6 (*)    Total Protein 6.3 (*)    Total Bilirubin <0.1 (*)    All other components within normal limits  CBC - Abnormal; Notable for the following:    RDW 15.8 (*)    All other components within normal limits  LIPASE, BLOOD  D-DIMER, QUANTITATIVE (NOT AT [facility])  I-STAT TROPOININ, ED    EKG  EKG Interpretation  Date/Time:  Sunday February 09 2017 11:33:42 EST Ventricular Rate:  93 PR Interval:    QRS Duration: 90 QT Interval:  346 QTC Calculation: 426 R Axis:   88 Text Interpretation:  Sinus rhythm  Borderline short PR interval Biatrial enlargement Anterior infarct, old Baseline wander in lead(s) V2 since last tracing no significant change Confirmed by Eulis Foster  MD, Zian Delair [phone]) on 02/09/2017 11:59:49 AM       Radiology Dg Chest 2 View  Result Date: 02/09/2017 CLINICAL DATA:  Shortness of Breath EXAM: CHEST  2 VIEW COMPARISON:  02/03/2017 FINDINGS: There is hyperinflation of the lungs compatible with COPD. Heart and mediastinal contours are within normal limits. No focal opacities or effusions. No acute bony abnormality. IMPRESSION: COPD.  No active disease. Electronically Signed   By: Rolm Baptise M.D.   On: 02/09/2017 13:02    Procedures Procedures (including critical care time)  Medications Ordered in ED Medications  sodium chloride  0.9 % bolus 1,000 mL (0 mLs Intravenous Stopped 02/09/17 1506)  ondansetron (ZOFRAN) injection 4 mg (4 mg Intravenous Given 02/09/17 1231)     Initial Impression / Assessment and Plan / ED Course  I have reviewed the triage vital signs and the nursing notes.  Pertinent labs & imaging results that were available during my care of the patient were reviewed by me and considered in my medical decision making (see chart for details).     Medications  sodium chloride 0.9 % bolus 1,000 mL (0 mLs Intravenous Stopped 02/09/17 1506)  ondansetron (ZOFRAN) injection 4 mg (4 mg Intravenous Given 02/09/17 1231)    Patient Vitals for the past 24 hrs:  BP Temp Temp src Pulse Resp SpO2 Height Weight  02/09/17 1346 106/61 - - 86 (!) 28 90 % - -  02/09/17 1300 103/69 - - 93 22 93 % - -  02/09/17 1135 103/67 97.6 F (36.4 C) Oral 88 (!) 30 95 % 5\' 1"  (1.549 m) 68 lb (30.8 kg)    3:47 PM Reevaluation with update and discussion. After initial assessment and treatment, an updated evaluation reveals Patient is comfortable. Findings discussed with patient and family member, all questions answered. Mayrani Khamis L    Final Clinical Impressions(s) / ED Diagnoses    Final diagnoses:  Dehydration   Malaise with weakness, and reassuring evaluation. Mild dehydration is likely cause for her discomfort. With IV fluids with improvement. Doubt serious bacterial infection, metabolic instability or impending vascular collapse.   Nursing Notes Reviewed/ Care Coordinated Applicable Imaging Reviewed Interpretation of Laboratory Data incorporated into ED treatment  The patient appears reasonably screened and/or stabilized for discharge and I doubt any other medical condition or other [facility] requiring further screening, evaluation, or treatment in the ED at this time prior to discharge.  Plan: Home Medications- continue; Home Treatments- double free water per feeding tube for 2 days, then 150% for 3 days, followed by usual intake; return here if the recommended treatment, does not improve the symptoms; Recommended follow up- PCP 1 week and when necessary  New Prescriptions New Prescriptions   No medications on file     Daleen Bo, MD 02/09/17 1549

## 2017-02-09 NOTE — ED Notes (Signed)
Patient states that her nausea is better.

## 2017-02-12 ENCOUNTER — Ambulatory Visit
Admission: RE | Admit: 2017-02-12 | Discharge: 2017-02-12 | Disposition: A | Discharge status: Home / Self Care | Payer: Medicare Other | Source: Ambulatory Visit | Attending: Radiation Oncology | Admitting: Radiation Oncology

## 2017-02-12 ENCOUNTER — Encounter: Payer: Self-pay | Admitting: Radiation Oncology

## 2017-02-12 ENCOUNTER — Ambulatory Visit (HOSPITAL_BASED_OUTPATIENT_CLINIC_OR_DEPARTMENT_OTHER): Payer: Medicare Other

## 2017-02-12 ENCOUNTER — Ambulatory Visit: Payer: Medicare Other | Admitting: Nutrition

## 2017-02-12 ENCOUNTER — Ambulatory Visit (HOSPITAL_BASED_OUTPATIENT_CLINIC_OR_DEPARTMENT_OTHER): Payer: Medicare Other | Admitting: Oncology

## 2017-02-12 ENCOUNTER — Telehealth: Payer: Self-pay | Admitting: *Deleted

## 2017-02-12 VITALS — BP 104/65 | HR 86 | Temp 98.3°F | Resp 24

## 2017-02-12 VITALS — BP 110/82 | HR 95 | Temp 98.2°F | Resp 16 | Ht 61.0 in | Wt 73.0 lb

## 2017-02-12 DIAGNOSIS — J449 Chronic obstructive pulmonary disease, unspecified: Secondary | ICD-10-CM

## 2017-02-12 DIAGNOSIS — C159 Malignant neoplasm of esophagus, unspecified: Secondary | ICD-10-CM | POA: Diagnosis not present

## 2017-02-12 DIAGNOSIS — Z885 Allergy status to narcotic agent status: Secondary | ICD-10-CM | POA: Insufficient documentation

## 2017-02-12 DIAGNOSIS — C154 Malignant neoplasm of middle third of esophagus: Secondary | ICD-10-CM

## 2017-02-12 DIAGNOSIS — R1310 Dysphagia, unspecified: Secondary | ICD-10-CM | POA: Diagnosis not present

## 2017-02-12 DIAGNOSIS — R110 Nausea: Secondary | ICD-10-CM | POA: Diagnosis not present

## 2017-02-12 DIAGNOSIS — Z79899 Other long term (current) drug therapy: Secondary | ICD-10-CM | POA: Diagnosis not present

## 2017-02-12 DIAGNOSIS — Z880 Allergy status to penicillin: Secondary | ICD-10-CM | POA: Diagnosis not present

## 2017-02-12 DIAGNOSIS — R627 Adult failure to thrive: Secondary | ICD-10-CM | POA: Insufficient documentation

## 2017-02-12 LAB — COMPREHENSIVE METABOLIC PANEL
ALT: 25 U/L (ref 0–55)
ANION GAP: 8 meq/L (ref 3–11)
AST: 23 U/L (ref 5–34)
Albumin: 2.7 g/dL — ABNORMAL LOW (ref 3.5–5.0)
Alkaline Phosphatase: 62 U/L (ref 40–150)
BUN: 14.7 mg/dL (ref 7.0–26.0)
CHLORIDE: 92 meq/L — AB (ref 98–109)
CO2: 31 mEq/L — ABNORMAL HIGH (ref 22–29)
CREATININE: 0.5 mg/dL — AB (ref 0.6–1.1)
Calcium: 8.7 mg/dL (ref 8.4–10.4)
EGFR: >90 mL/min/{1.73_m2} (ref >90)
Glucose: 130 mg/dl (ref 70–140)
POTASSIUM: 3.4 meq/L — AB (ref 3.5–5.1)
Sodium: 131 mEq/L — ABNORMAL LOW (ref 136–145)
Total Bilirubin: 0.38 mg/dL (ref 0.20–1.20)
Total Protein: 5.7 g/dL — ABNORMAL LOW (ref 6.4–8.3)

## 2017-02-12 LAB — MAGNESIUM: MAGNESIUM: 2 mg/dL (ref 1.5–2.5)

## 2017-02-12 MED ORDER — ONDANSETRON HCL 4 MG/5ML PO SOLN: 8.0000 mg | Freq: Three times a day (TID) | ORAL | 2 refills | Status: DC

## 2017-02-12 MED ORDER — SODIUM CHLORIDE 0.9 % IV SOLN
Freq: Once | INTRAVENOUS | Status: DC
  Administered 2017-02-12: 13:00:00 via INTRAVENOUS

## 2017-02-12 MED FILL — ONDANSETRON 4 MG/5 ML SOLN: 4 | 2 days supply | Qty: 50 | Fill #0

## 2017-02-12 NOTE — Telephone Encounter (Signed)
CALLED PATIENT TO INFORM OF FU WITH ALISON PERKINS ON 08/12/17 @ 1:30 PM WITH ALISON PERKINS, LVM FOR A RETURN CALL

## 2017-02-12 NOTE — Patient Instructions (Signed)
Dehydration, Adult Dehydration is a condition in which there is not enough fluid or water in the body. This happens when you lose more fluids than you take in. Important organs, such as the kidneys, brain, and heart, cannot function without a proper amount of fluids. Any loss of fluids from the body can lead to dehydration. Dehydration can range from mild to severe. This condition should be treated right away to prevent it from becoming severe. What are the causes? This condition may be caused by:  Vomiting.  Diarrhea.  Excessive sweating, such as from heat exposure or exercise.  Not drinking enough fluid, especially:  When ill.  While doing activity that requires a lot of energy.  Excessive urination.  Fever.  Infection.  Certain medicines, such as medicines that cause the body to lose excess fluid (diuretics).  Inability to access safe drinking water.  Reduced physical ability to get adequate water and food. What increases the risk? This condition is more likely to develop in people:  Who have a poorly controlled long-term (chronic) illness, such as diabetes, heart disease, or kidney disease.  Who are age 65 or older.  Who are disabled.  Who live in a place with high altitude.  Who play endurance sports. What are the signs or symptoms? Symptoms of mild dehydration may include:   Thirst.  Dry lips.  Slightly dry mouth.  Dry, warm skin.  Dizziness. Symptoms of moderate dehydration may include:   Very dry mouth.  Muscle cramps.  Dark urine. Urine may be the color of tea.  Decreased urine production.  Decreased tear production.  Heartbeat that is irregular or faster than normal (palpitations).  Headache.  Light-headedness, especially when you stand up from a sitting position.  Fainting (syncope). Symptoms of severe dehydration may include:   Changes in skin, such as:  Cold and clammy skin.  Blotchy (mottled) or pale skin.  Skin that does  not quickly return to normal after being lightly pinched and released (poor skin turgor).  Changes in body fluids, such as:  Extreme thirst.  No tear production.  Inability to sweat when body temperature is high, such as in hot weather.  Very little urine production.  Changes in vital signs, such as:  Weak pulse.  Pulse that is more than 100 beats a minute when sitting still.  Rapid breathing.  Low blood pressure.  Other changes, such as:  Sunken eyes.  Cold hands and feet.  Confusion.  Lack of energy (lethargy).  Difficulty waking up from sleep.  Short-term weight loss.  Unconsciousness. How is this diagnosed? This condition is diagnosed based on your symptoms and a physical exam. Blood and urine tests may be done to help confirm the diagnosis. How is this treated? Treatment for this condition depends on the severity. Mild or moderate dehydration can often be treated at home. Treatment should be started right away. Do not wait until dehydration becomes severe. Severe dehydration is an emergency and it needs to be treated in a hospital. Treatment for mild dehydration may include:   Drinking more fluids.  Replacing salts and minerals in your blood (electrolytes) that you may have lost. Treatment for moderate dehydration may include:   Drinking an oral rehydration solution (ORS). This is a drink that helps you replace fluids and electrolytes (rehydrate). It can be found at pharmacies and retail stores. Treatment for severe dehydration may include:   Receiving fluids through an IV tube.  Receiving an electrolyte solution through a feeding tube that is   passed through your nose and into your stomach (nasogastric tube, or NG tube).  Correcting any abnormalities in electrolytes.  Treating the underlying cause of dehydration. Follow these instructions at home:  If directed by your health care provider, drink an ORS:  Make an ORS by following instructions on the  package.  Start by drinking small amounts, about  cup (120 mL) every 5-10 minutes.  Slowly increase how much you drink until you have taken the amount recommended by your health care provider.  Drink enough clear fluid to keep your urine clear or pale yellow. If you were told to drink an ORS, finish the ORS first, then start slowly drinking other clear fluids. Drink fluids such as:  Water. Do not drink only water. Doing that can lead to having too little salt (sodium) in the body (hyponatremia).  Ice chips.  Fruit juice that you have added water to (diluted fruit juice).  Low-calorie sports drinks.  Avoid:  Alcohol.  Drinks that contain a lot of sugar. These include high-calorie sports drinks, fruit juice that is not diluted, and soda.  Caffeine.  Foods that are greasy or contain a lot of fat or sugar.  Take over-the-counter and prescription medicines only as told by your health care provider.  Do not take sodium tablets. This can lead to having too much sodium in the body (hypernatremia).  Eat foods that contain a healthy balance of electrolytes, such as bananas, oranges, potatoes, tomatoes, and spinach.  Keep all follow-up visits as told by your health care provider. This is important. Contact a health care provider if:  You have abdominal pain that:  Gets worse.  Stays in one area (localizes).  You have a rash.  You have a stiff neck.  You are more irritable than usual.  You are sleepier or more difficult to wake up than usual.  You feel weak or dizzy.  You feel very thirsty.  You have urinated only a small amount of very dark urine over 6-8 hours. Get help right away if:  You have symptoms of severe dehydration.  You cannot drink fluids without vomiting.  Your symptoms get worse with treatment.  You have a fever.  You have a severe headache.  You have vomiting or diarrhea that:  Gets worse.  Does not go away.  You have blood or green matter  (bile) in your vomit.  You have blood in your stool. This may cause stool to look black and tarry.  You have not urinated in 6-8 hours.  You faint.  Your heart rate while sitting still is over 100 beats a minute.  You have trouble breathing. This information is not intended to replace advice given to you by your health care provider. Make sure you discuss any questions you have with your health care provider. Document Released: 12/16/2005 Document Revised: 07/12/2016 Document Reviewed: 02/09/2016 Elsevier Interactive Patient Education  2017 [employer]  

## 2017-02-12 NOTE — Progress Notes (Signed)
Nutrition follow-up completed with patient and husband status post treatment for esophageal cancer. Weight improved documented as 73 pounds on February 14 increased from 69.3 pounds January 25. Patient reports nausea is controlled with nausea medication. Patient reports diarrhea has resolved. She is not eating by mouth. Reports she only tolerates one can Osmolite 1.5, 4 times a day. Patient refuses continuous feeding.  Estimated nutrition needs: 1200-1400 calories, 48-60 grams protein, 1.4 L fluid.  Nutrition diagnosis: Malnutrition continues.  Intervention: I educated patient to increase Osmolite 1.5-10 ounces 4 times a day with 120 cc of free water before and after 4 times a day. This provides 1775 cal, 74.5 g protein, and 2105 mL free water meeting greater than 100% of estimated needs. Teach back method used.  Monitoring, evaluation, goals: Patient will tolerate increased tube feeding to promote weight gain.  Next visit: I will continue to follow as needed.  **Disclaimer: This note was dictated with voice recognition software. Similar sounding words can inadvertently be transcribed and this note may contain transcription errors which may not have been corrected upon publication of note.**

## 2017-02-12 NOTE — Progress Notes (Signed)
[facility] OFFICE PROGRESS NOTE   Diagnosis: Esophagus cancer  INTERVAL HISTORY:   Ms. Cabezas returns prior to a scheduled visit. She was seen in radiation oncology earlier today and requested an appointment in medical oncology. She reports feeling "terrible ". She has diarrhea 8-10 times per day for the past 2 weeks. She feels "achy "all over. No fever. She continues 5 cans of tube feeding per day. She complains of a marked decrease in her energy level. No change in baseline dyspnea.  She was admitted on 02/03/2017 with probable pneumonia. She completed an outpatient course of Augmentin and azithromycin. She was seen in the emergency room 02/09/2017 with diarrhea and dyspnea. She received intravenous fluids and was discharged to home.  Objective:  Vital signs in last 24 hours:  Blood pressure 104/65, pulse 86, temperature 98.3 F (36.8 C), temperature source Oral, resp. rate (!) 24, SpO2 92 %.    HEENT: No thrush or ulcers Resp: Distant breath sounds, no respiratory distress Cardio: Regular rate and rhythm GI: No hepatomegaly, mild diffuse tenderness, bowel sounds are present. Left upper quadrant feeding tube site without evidence of infection Vascular: No leg edema, diminished skin turgor   Lab Results:  Lab Results  Component Value Date   WBC 5.4 02/09/2017   HGB 13.5 02/09/2017   HCT 40.2 02/09/2017   MCV 96.4 02/09/2017   PLT 242 02/09/2017   NEUTROABS 6.6 02/03/2017      Imaging:  Dg Chest 2 View  Result Date: 02/09/2017 CLINICAL DATA:  Shortness of Breath EXAM: CHEST  2 VIEW COMPARISON:  02/03/2017 FINDINGS: There is hyperinflation of the lungs compatible with COPD. Heart and mediastinal contours are within normal limits. No focal opacities or effusions. No acute bony abnormality. IMPRESSION: COPD.  No active disease. Electronically Signed   By: Rolm Baptise M.D.   On: 02/09/2017 13:02    Medications: I have reviewed the patient's current  medications.  Assessment/Plan: 1. Squamous cell carcinoma the midesophagus ? Staging CT scans 10/29/2016 with no evidence of metastatic disease; nonspecific liver lesions, right hydronephrosis, retroperitoneal lymph nodes, and sclerotic right seventh rib lesion ? Staging PET scan 11/13/2016-low left jugular/supraclavicular node measuring 5 mm and SUV max of 2.6; left paratracheal node measuring8 mm and SUV max 5.8; periaorticnodes including a 6 mm node SUV max 4.4; right retrocrural hypermetabolic node measures 8 mm and SUV max of 9.4;hypermetabolism corresponding to the mid esophageal primary SUV max 15.6; no right hepatic lobe hypermetabolism; no hypermetabolism to correspond to the retroperitoneal mild adenopathy on prior exam. ? Initiation of radiation 11/25/2016 and concurrent weekly Taxol/carboplatin 11/26/2016; week 5 Taxol/carboplatin 12/31/2016; radiation completed 01/03/2017  2. COPD  3. 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 Right hydronephrosis on the CT 10/29/2016  5. Dysphagia/weight loss secondary to #1  6. Rash upper back 12/10/2016. Question etiology.  7. Feeding tube placement 12/26/2016  8.  Diarrhea, failure to thrive 02/12/2017    Disposition:  Ms. Finneran completed treatment for esophagus cancer last month. There is no clinical evidence for progression of the esophagus cancer. She continues to have dysphagia. The plan is to make a GI referral within the next month. She presents today with diarrhea and failure to thrive. I suspect she is dehydrated. The diarrhea could be related to recent antibiotic therapy, tube feedings, or C. difficile. She will receive intravenous fluids today. We will check a stool sample for the C.  difficile toxin. We will check a chemistry panel.  She will contact us if she does not feel better after the IV fluids and at the diarrhea  does not improve. We will recommend she begin Imodium if the C. difficile returns negative. Ms. Bezy will return for an office visit on 02/17/2017.  25 minutes were spent with the patient today. The majority of the time was used for counseling and coordination of care.  Betsy Coder, MD  02/12/2017  12:40 PM

## 2017-02-12 NOTE — Progress Notes (Signed)
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  Interval Since Last Radiation:  5 weeks   11/25/16 - 01/03/17: Esophagus treated to 45 Gy in 25 fractions of 1.8 Gy. The Esophagus was then boosted to 50.4 Gy in 3 fractions of 1.8 Gy.  Narrative:  The patient returns today for routine follow-up.  She's been evaluated on two occasions in the inpatient setting in the last week for pneumonia as well as dehydration. She will meet Ernestene Kiel today as well regarding PEG feeds.                             On review of systems, the patient states since radiation shes been doing ok but is struggling with nausea, not responding to compazine. She is concerned regarding her counseling for eating and drinking. She denies shortness of breath or chest pain, vomiting, or abdominal pain. She continues to have fluctuations of weight as well as fatigue and generally feeling poorly. No other complaints specifically are noted.  ALLERGIES:  is allergic to amoxicillin; benadryl [diphenhydramine hcl]; and codeine.  Meds: Current Outpatient Prescriptions  Medication Sig Dispense Refill  . acetaminophen (TYLENOL) 160 MG/5ML solution Take 480 mg by mouth every 6 (six) hours as needed for moderate pain.     . Nutritional Supplements (FEEDING SUPPLEMENT, OSMOLITE 1.5 CAL,) LIQD Place 237 mLs into feeding tube 5 (five) times daily. (Patient taking differently: Place 237 mLs into feeding tube 4 (four) times daily. ) 1000 mL 0  . albuterol (PROAIR HFA) 108 (90 Base) MCG/ACT inhaler 2 puffs every 4 hours as needed only  if your can't catch your breath 1 Inhaler 11  . Lactobacillus (PROBIOTIC ACIDOPHILUS PO) Take 1 tablet by mouth daily.    . ondansetron (ZOFRAN) 4 MG/5ML solution  Place 10 mLs (8 mg total) into feeding tube 3 (three) times daily. (Patient not taking: Reported on 02/12/2017) 50 mL 2  . prochlorperazine (COMPAZINE) 5 MG tablet Take 5 mg by mouth every 6 (six) hours as needed for nausea/vomiting.  1  . traZODone (DESYREL) 50 MG tablet Take 1 tablet (50 mg total) by mouth at bedtime as needed for sleep. 10 tablet 0   No current facility-administered medications for this encounter.     Physical Findings:  height is 5\' 1"  (1.549 m) and weight is 73 lb (33.1 kg). Her oral temperature is 98.2 F (36.8 C). Her blood pressure is 110/82 and her pulse is 95. Her respiration is 16 and oxygen saturation is 97%.  Pain Assessment Pain Score: 0-No pain/10 In general this isa thin, frail caucasian female who appears to be chronically ill, in no acute distress. She's alert and oriented x4 and appropriate throughout the examination. Cardiopulmonary assessment is negative for acute distress and she exhibits normal effort.   Lab Findings: Lab Results  Component Value Date   WBC 5.4 02/09/2017   HGB 13.5 02/09/2017   HCT 40.2 02/09/2017   MCV 96.4 02/09/2017   PLT 242 02/09/2017     Radiographic Findings: Dg Chest 2 View  Result Date: 02/09/2017 CLINICAL DATA:  Shortness of Breath EXAM: CHEST  2 VIEW COMPARISON:  02/03/2017 FINDINGS: There is hyperinflation of the lungs compatible with COPD.  Heart and mediastinal contours are within normal limits. No focal opacities or effusions. No acute bony abnormality. IMPRESSION: COPD.  No active disease. Electronically Signed   By: Rolm Baptise M.D.   On: 02/09/2017 13:02   Dg Chest 2 View  Result Date: 02/03/2017 CLINICAL DATA:  Three days of coughing and shortness of breath with fever and chills. The patient is undergoing chemotherapy for esophageal malignancy. History of COPD and previous left breast malignancy. EXAM: CHEST  2 VIEW COMPARISON:  Chest x-ray of April 24, 2016 FINDINGS: The lungs remain hyperinflated and  hyperlucent. There is no alveolar infiltrate. There is no pleural effusion. No pulmonary parenchymal nodules or masses are observed. The heart and pulmonary vascularity are normal. The mediastinum is normal in width. There is calcification in the wall of the aortic arch. The bony thorax exhibits no acute abnormality. IMPRESSION: Marked hyperinflation consistent with COPD. No acute pneumonia nor CHF. No evidence of metastatic disease. Thoracic aortic atherosclerosis. Electronically Signed   By: David  Martinique M.D.   On: 02/03/2017 16:36   Ct Angio Chest Pe W And/or Wo Contrast  Result Date: 02/03/2017 CLINICAL DATA:  Shortness of breath, cough. History of esophageal cancer. EXAM: CT ANGIOGRAPHY CHEST WITH CONTRAST TECHNIQUE: Multidetector CT imaging of the chest was performed using the standard protocol during bolus administration of intravenous contrast. Multiplanar CT image reconstructions and MIPs were obtained to evaluate the vascular anatomy. CONTRAST:  55 mL of Isovue 370 intravenously. COMPARISON:  CT scan of October 29, 2016. FINDINGS: Cardiovascular: Atherosclerosis of thoracic aorta is noted without aneurysm or dissection. There is no definite evidence of pulmonary embolus. Mediastinum/Nodes: No mediastinal adenopathy is noted. There is thickening of the middle and distal portions of the esophagus consistent with a history of esophageal cancer. Thyroid gland is unremarkable. Lungs/Pleura: Mild emphysematous disease is noted in the upper lobes bilaterally. Abnormal density is seen in the lower lobe bronchi bilaterally suggesting mucous plugging or aspirated material. No pneumothorax or pleural effusion is noted. Upper Abdomen: No acute abnormality. Musculoskeletal: No chest wall abnormality. No acute or significant osseous findings. Review of the MIP images confirms the above findings. IMPRESSION: Aortic atherosclerosis. No definite evidence of pulmonary embolus. Thickening of the middle and distal  portions of the esophagus consistent with history of esophageal cancer. Mild emphysematous disease is noted in the upper lobes bilaterally. Abnormal density is noted within the lower lobe bronchi bilaterally suggesting mucous plugging or aspirated material. Electronically Signed   By: Marijo Conception, M.D.   On: 02/03/2017 16:54   Ir Patient Eval Tech 0-60 Mins  Result Date: 01/17/2017 Chipper Oman     01/17/2017  4:22 PM Patient came in with complaint of severe pain at G tube insertion site.  After evaluating the site, it was determined that the g tube retention bumper was too tight.  The bumper was retracted and repositioned with the final placement at marker level 3.  The patient was able to move and cough without the previous pain.  She was happy and said her pain issues were resolved.  She did inform me that Dr Benay Spice had called in a prescription for antibiotics anticipating the g tube was infected.  Keven Allred PAC evaluated the site and determined that it was not infected and advised the patient the antibiotics were not necessary.  The patient had no other concerns for myself or Lennette Bihari.  She was advised to call if she has any further issues.   Impression/Plan: 1. Locally advanced squamous  cell carcinoma of the esophagus.  The patient appears to be recovering from radiotherapy, though in general has some concerning features in her nutritional status for failure to thrive. I have contacted Dr. Benay Spice as wel, and we will defer additional evaluation and recommendations to him. I have suggested evaluation by palliative care, and added zofran suspension for her nausea and reviewed the side effect profile of this. We will plan to see her back in 6 months time for follow up.     Carola Rhine, PAC

## 2017-02-16 ENCOUNTER — Telehealth: Payer: Self-pay

## 2017-02-16 NOTE — Telephone Encounter (Signed)
Called patient with 2/19 appt.  Per los it stated 9:15 with [name] but that slot had been booked.  Another time /appt was made for later in the day.   Karen Osborn

## 2017-02-17 ENCOUNTER — Other Ambulatory Visit: Payer: Medicare Other

## 2017-02-17 ENCOUNTER — Ambulatory Visit: Payer: Medicare Other | Admitting: Nurse Practitioner

## 2017-02-17 ENCOUNTER — Telehealth: Payer: Self-pay | Admitting: *Deleted

## 2017-02-17 NOTE — Telephone Encounter (Signed)
Spoke with pt in lobby, she reports decrease in diarrhea. She did not return stool specimen, was given collection kit today. Informed her that providers are unable to work her in early today. She requested to reschedule to another day.

## 2017-02-18 ENCOUNTER — Other Ambulatory Visit (HOSPITAL_COMMUNITY)
Admission: RE | Admit: 2017-02-18 | Discharge: 2017-02-18 | Disposition: A | Discharge status: Home / Self Care | Payer: Medicare Other | Source: Ambulatory Visit | Attending: Oncology | Admitting: Oncology

## 2017-02-18 ENCOUNTER — Ambulatory Visit: Payer: Medicare Other

## 2017-02-18 DIAGNOSIS — C154 Malignant neoplasm of middle third of esophagus: Secondary | ICD-10-CM

## 2017-02-18 LAB — C DIFFICILE QUICK SCREEN W PCR REFLEX
C DIFFICILE (CDIFF) TOXIN: NEGATIVE
C Diff antigen: NEGATIVE
C Diff interpretation: NOT DETECTED

## 2017-02-20 ENCOUNTER — Ambulatory Visit (HOSPITAL_BASED_OUTPATIENT_CLINIC_OR_DEPARTMENT_OTHER): Payer: Medicare Other | Admitting: Nurse Practitioner

## 2017-02-20 ENCOUNTER — Telehealth: Payer: Self-pay | Admitting: Oncology

## 2017-02-20 VITALS — BP 104/71 | HR 99 | Temp 98.1°F | Resp 30 | Wt 74.2 lb

## 2017-02-20 DIAGNOSIS — J449 Chronic obstructive pulmonary disease, unspecified: Secondary | ICD-10-CM

## 2017-02-20 DIAGNOSIS — R1310 Dysphagia, unspecified: Secondary | ICD-10-CM | POA: Diagnosis not present

## 2017-02-20 DIAGNOSIS — C154 Malignant neoplasm of middle third of esophagus: Secondary | ICD-10-CM

## 2017-02-20 NOTE — Progress Notes (Addendum)
  [facility] OFFICE PROGRESS NOTE   Diagnosis:  Esophagus cancer  INTERVAL HISTORY:   Karen Osborn returns as scheduled. She is feeling better. The diarrhea has markedly improved. She is less "achy". Energy level remains poor. No change in baseline dyspnea. She continues oxygen at 2 L/m. Main nutrition is via the feeding tube. She is occasionally able to tolerate small sips of sprite.  Objective:  Vital signs in last 24 hours:  Blood pressure 104/71, pulse 99, temperature 98.1 F (36.7 C), temperature source Oral, resp. rate (!) 40, weight 74 lb 3 oz (33.7 kg), SpO2 100 %.    HEENT: No thrush or ulcers. Mucous membranes appear moist. Resp: Lungs with bilateral expiratory rhonchi. Breath sounds are distant. No respiratory distress. Cardio: Regular rate and rhythm. GI: No hepatomegaly. Left upper quadrant feeding tube site without evidence of infection. Vascular: No leg edema.    Lab Results:  Lab Results  Component Value Date   WBC 5.4 02/09/2017   HGB 13.5 02/09/2017   HCT 40.2 02/09/2017   MCV 96.4 02/09/2017   PLT 242 02/09/2017   NEUTROABS 6.6 02/03/2017    Imaging:  No results found.  Medications: I have reviewed the patient's current medications.  Assessment/Plan: 1. Squamous cell carcinoma the midesophagus ? Staging CT scans 10/29/2016 with no evidence of metastatic disease; nonspecific liver lesions, right hydronephrosis, retroperitoneal lymph nodes, and sclerotic right seventh rib lesion ? Staging PET scan 11/13/2016-low left jugular/supraclavicular node measuring 5 mm and SUV max of 2.6; left paratracheal node measuring8 mm and SUV max 5.8; periaorticnodes including a 6 mm node SUV max 4.4; right retrocrural hypermetabolic node measures 8 mm and SUV max of 9.4;hypermetabolism corresponding to the mid esophageal primary SUV max 15.6; no right hepatic lobe hypermetabolism; no hypermetabolism to correspond to the retroperitoneal mild adenopathy  on prior exam. ? Initiation of radiation 11/25/2016 and concurrent weekly Taxol/carboplatin 11/26/2016; week 5 Taxol/carboplatin 12/31/2016; radiation completed 01/03/2017  2. COPD  3. 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 Right hydronephrosis on the CT 10/29/2016  5. Dysphagia/weight loss secondary to #1  6. Rash upper back 12/10/2016. Question etiology.  7. Feeding tube placement 12/26/2016  8.  Diarrhea, failure to thrive 02/12/2017. C. difficile negative 02/18/2017. Improved 02/20/2017.    Disposition: Karen Osborn appears stable. The diarrhea is significantly improved. She continues to have dysphagia. We are referring her to Dr. Michail Sermon for follow-up evaluation since completing the course of concurrent chemotherapy/radiation. We scheduled a return visit here in 4 weeks. She will contact the office in the interim with any problems.  Patient seen with Dr. Benay Spice.    Ned Card ANP/GNP-BC   02/20/2017  12:31 PM  This was a shared visit with Ned Card. Karen Osborn has an improved performance status. We will refer her to Dr. Michail Sermon to evaluate the persistent dysphagia.  Julieanne Manson, M.D.

## 2017-02-20 NOTE — Telephone Encounter (Signed)
Appointments scheduled per 2/22 LOS. Patient given AVS report and calendars with future scheduled appointments. Patient scheduled with Dr. Michail Sermon at 10 AM on 3/5. Patient aware of appointment date and time. Spoke with a Vickii Penna to schedule appointment with physician.

## 2017-03-03 ENCOUNTER — Other Ambulatory Visit: Payer: Self-pay | Admitting: Gastroenterology

## 2017-03-03 DIAGNOSIS — C159 Malignant neoplasm of esophagus, unspecified: Secondary | ICD-10-CM

## 2017-03-06 ENCOUNTER — Ambulatory Visit
Admission: RE | Admit: 2017-03-06 | Discharge: 2017-03-06 | Disposition: A | Discharge status: Home / Self Care | Payer: Medicare Other | Source: Ambulatory Visit | Attending: Gastroenterology | Admitting: Gastroenterology

## 2017-03-06 DIAGNOSIS — C159 Malignant neoplasm of esophagus, unspecified: Secondary | ICD-10-CM

## 2017-03-20 ENCOUNTER — Telehealth: Payer: Self-pay | Admitting: Oncology

## 2017-03-20 ENCOUNTER — Ambulatory Visit (HOSPITAL_BASED_OUTPATIENT_CLINIC_OR_DEPARTMENT_OTHER): Payer: Medicare Other | Admitting: Oncology

## 2017-03-20 VITALS — BP 110/68 | HR 85 | Temp 97.8°F | Resp 20 | Wt 77.1 lb

## 2017-03-20 DIAGNOSIS — J449 Chronic obstructive pulmonary disease, unspecified: Secondary | ICD-10-CM

## 2017-03-20 DIAGNOSIS — C154 Malignant neoplasm of middle third of esophagus: Secondary | ICD-10-CM

## 2017-03-20 NOTE — Telephone Encounter (Signed)
Gave patient avs report and appointments for April  °

## 2017-03-20 NOTE — Progress Notes (Signed)
  [facility] OFFICE PROGRESS NOTE   Diagnosis: Esophagus cancer  INTERVAL HISTORY:   Mr. Gebel returns as scheduled. She saw Dr. Michail Sermon and he ordered an esophagram. The esophagram on 03/06/2017 revealed smooth narrowing at the site of the prior esophageal malignancy with no apparent mass. No hiatal hernia or reflux.  She reports tolerating liquids without difficulty. She has not tried much solid food. She has a poor appetite. She continues 5 cans of nutrition supplement daily via the feeding tube. She has noted increased dyspnea. No diarrhea. Objective:  Vital signs in last 24 hours:  Blood pressure 110/68, pulse 85, temperature 97.8 F (36.6 C), temperature source Oral, resp. rate 20, weight 77 lb 1.6 oz (35 kg), SpO2 100 %.    HEENT: Neck without mass Lymphatics: No cervical, supraclavicular, or axillary nodes Resp: Distant breath sounds with scattered expiratory wheeze, no respiratory distress Cardio: Regular rate and rhythm GI: No hepatosplenomegaly, left upper quadrant feeding tube site without evidence of infection Vascular: No leg edema    Medications: I have reviewed the patient's current medications.  Assessment/Plan: 1.Squamous cell carcinoma the midesophagus  Staging CT scans 10/29/2016 with no evidence of metastatic disease; nonspecific liver lesions, right hydronephrosis, retroperitoneal lymph nodes, and sclerotic right seventh rib lesion  Staging PET scan 11/13/2016-low left jugular/supraclavicular node measuring 5 mm and SUV max of 2.6; left paratracheal node measuring8 mm and SUV max 5.8; periaorticnodes including a 6 mm node SUV max 4.4; right retrocrural hypermetabolic node measures 8 mm and SUV max of 9.4;hypermetabolism corresponding to the mid esophageal primary SUV max 15.6; no right hepatic lobe hypermetabolism; no hypermetabolism to correspond to the retroperitoneal mild adenopathy on prior exam.  Initiation of radiation 11/25/2016  and concurrent weekly Taxol/carboplatin 11/26/2016; week 5 Taxol/carboplatin 12/31/2016; radiation completed 01/03/2017  Esophagram 03/06/2017-smooth narrowing of the thoracic esophagus  2. COPD  3. 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 Right hydronephrosis on the CT 10/29/2016  5. Dysphagia/weight loss secondary to #1  6. Rash upper back 12/10/2016. Question etiology.  7. Feeding tube placement 12/26/2016  8. Diarrhea, failure to thrive 02/12/2017. C. difficile negative 02/18/2017. Improved 02/20/2017.    Disposition:  Her overall performance status appears improved. She no longer has diarrhea and has gained weight. She continues to have anorexia and is not eating solids. I encouraged her to increase her oral intake with nutrition supplements and solids as tolerated. She will decrease the tube feeding by 1 can daily with the hope this will stimulate her appetite. She declined an appetite stimulant.  She will follow-up with pulmonary medicine for management of the COPD.  Ms. Winkels will return for an office visit in 4 weeks.  Betsy Coder, MD  03/20/2017  9:10 AM

## 2017-04-04 ENCOUNTER — Ambulatory Visit: Payer: Medicare Other | Admitting: Adult Health

## 2017-04-09 ENCOUNTER — Ambulatory Visit (INDEPENDENT_AMBULATORY_CARE_PROVIDER_SITE_OTHER): Payer: Medicare Other | Admitting: Adult Health

## 2017-04-09 ENCOUNTER — Encounter: Payer: Self-pay | Admitting: Adult Health

## 2017-04-09 DIAGNOSIS — J449 Chronic obstructive pulmonary disease, unspecified: Secondary | ICD-10-CM

## 2017-04-09 DIAGNOSIS — J9610 Chronic respiratory failure, unspecified whether with hypoxia or hypercapnia: Secondary | ICD-10-CM | POA: Insufficient documentation

## 2017-04-09 DIAGNOSIS — J9611 Chronic respiratory failure with hypoxia: Secondary | ICD-10-CM

## 2017-04-09 MED ORDER — TIOTROPIUM BROMIDE MONOHYDRATE 2.5 MCG/ACT IN AERS: 2.0000 | INHALATION_SPRAY | Freq: Every day | RESPIRATORY_TRACT | 5 refills | Status: DC

## 2017-04-09 MED ORDER — TIOTROPIUM BROMIDE MONOHYDRATE 2.5 MCG/ACT IN AERS: 2.0000 | INHALATION_SPRAY | Freq: Every day | RESPIRATORY_TRACT | 0 refills | Status: DC

## 2017-04-09 NOTE — Progress Notes (Signed)
@Patient  ID: Karen Osborn, female    DOB: 05-Sep-1941, 76 y.o.   MRN: [id number]  Chief Complaint  Patient presents with  . Follow-up    COPD     Referring provider: Judge Stall, FNP  HPI: 76 year old female former smoker quit 01/2017 , followed for gold 3 COPD and O2 RF -2l/m   TEST  PFT's  11/21/15  FEV1 0.83 (42 % ) ratio 47  p 18 % improvement from saba  - Spirometry 04/24/16   FEV1 0.60 (32%)  Ratio 43     04/09/2017 Follow up: COPD  Pt returns for 6 month follow up for COPD .  Patient says that she was having throat irritation and stopped Spiriva in September 2017. She was diagnosed with esophageal cancer in October and has been undergoing treatment.  Tells now that this was not the Spiriva that caused her throat irritation . Has finished chemo/ XRT .  Has feeding tube . Tolerating liquids . Not taking any much food. Esophagram 03/06/17 that showed smooth narrowing at the site of prior esophageal malignancy .  Wt is starting to go back up , up about 10 lbs.  Since stopping spiriva , breathing is not as good. Feels Spiriva would help.   Admitted in Feb with aspiration PNA after getting choked on pill. She was treated with IV abx and discharged augmentin . Follow up CXR 2/11 w/ COPD changes   Prevnar and PVX is utd . She will check to make sure last PVX.Marland Kitchen   Has stopped smoking since Feb.     Allergies  Allergen Reactions  . Amoxicillin Nausea And Vomiting    Has patient had a PCN reaction causing immediate rash, facial/tongue/throat swelling, SOB or lightheadedness with hypotension: No Has patient had a PCN reaction causing severe rash involving mucus membranes or skin necrosis: No Has patient had a PCN reaction that required hospitalization No Has patient had a PCN reaction occurring within the last 10 years: Yes If all of the above answers are "NO", then may proceed with Cephalosporin use.   . Benadryl [Diphenhydramine Hcl] Other (See Comments)    Jittery,  patient states "legs bouncing up and down"   . Codeine Nausea And Vomiting    Immunization History  Administered Date(s) Administered  . Influenza, High Dose Seasonal PF 09/29/2016  . Pneumococcal Conjugate-13 12/30/2014    Past Medical History:  Diagnosis Date  . Breast cancer ([place])    left  . COPD (chronic obstructive pulmonary disease) ([place])   . Dyspnea   . GERD (gastroesophageal reflux disease)     Tobacco History: History  Smoking Status  . Former Smoker  . Packs/day: 0.50  . Years: 54.00  . Types: Cigarettes  . Quit date: 01/30/2017  Smokeless Tobacco  . Never Used   Counseling given: Not Answered   Outpatient Encounter Prescriptions as of 04/09/2017  Medication Sig  . acetaminophen (TYLENOL) 160 MG/5ML solution Take 480 mg by mouth every 6 (six) hours as needed for moderate pain.   Marland Kitchen albuterol (PROAIR HFA) 108 (90 Base) MCG/ACT inhaler 2 puffs every 4 hours as needed only  if your can't catch your breath  . lansoprazole (PREVACID) 30 MG capsule Take 30 mg by mouth daily at 12 noon.  . Nutritional Supplements (FEEDING SUPPLEMENT, OSMOLITE 1.5 CAL,) LIQD Place 237 mLs into feeding tube 5 (five) times daily. (Patient taking differently: Place 237 mLs into feeding tube 4 (four) times daily. )  . prochlorperazine (COMPAZINE) 5 MG tablet  Take 5 mg by mouth every 6 (six) hours as needed for nausea/vomiting.  Marland Kitchen esomeprazole (NEXIUM) 40 MG packet Take 40 mg by mouth daily before breakfast.  . ondansetron (ZOFRAN) 4 MG/5ML solution Place 10 mLs (8 mg total) into feeding tube 3 (three) times daily. (Patient not taking: Reported on 04/09/2017)  . traZODone (DESYREL) 50 MG tablet Take 1 tablet (50 mg total) by mouth at bedtime as needed for sleep. (Patient not taking: Reported on 04/09/2017)   No facility-administered encounter medications on file as of 04/09/2017.      Review of Systems  Constitutional:   No  weight loss, night sweats,  Fevers, chills,  +fatigue, or   lassitude.  HEENT:   No headaches,  Difficulty swallowing,  Tooth/dental problems, or  Sore throat,                No sneezing, itching, ear ache, + nasal congestion, post nasal drip,   CV:  No chest pain,  Orthopnea, PND, swelling in lower extremities, anasarca, dizziness, palpitations, syncope.   GI  No  change in bowel habits, loss of appetite, bloody stools.   Resp:   No chest wall deformity  Skin: no rash or lesions.  GU: no dysuria, change in color of urine, no urgency or frequency.  No flank pain, no hematuria   MS:  No joint pain or swelling.  No decreased range of motion.  No back pain.    Physical Exam  BP 108/68 (BP Location: Left Arm, Cuff Size: Small)   Pulse 77   Ht 5\' 1"  (1.549 m)   Wt 79 lb 6.4 oz (36 kg)   SpO2 94%   BMI 15.00 kg/m   GEN: A/Ox3; pleasant , NAD, thin frail    HEENT:  [place]/AT,  EACs-clear, TMs-wnl, NOSE-clear, THROAT-clear, no lesions, no postnasal drip or exudate noted.   NECK:  Supple w/ fair ROM; no JVD; normal carotid impulses w/o bruits; no thyromegaly or nodules palpated; no lymphadenopathy.    RESP  Decreased BS in bases . no accessory muscle use, no dullness to percussion  CARD:  RRR, no m/r/g, no peripheral edema, pulses intact, no cyanosis or clubbing.  GI:   Soft & nt; nml bowel sounds; no organomegaly or masses detected.   Musco: Warm bil, no deformities or joint swelling noted.   Neuro: alert, no focal deficits noted.    Skin: Warm, no lesions or rashes   Lab Results:  CBC  BNP No results found for: BNP  ProBNP No results found for: PROBNP  Imaging: No results found.   Assessment & Plan:   COPD  GOLD III still smoking  Smoking cessation encouraged  Restart Spiriva   Plan Patient Instructions  Restart Spiriva 2 puffs daily , rinse well after using  May use liquid Mucinex Twice daily  As needed  Cough /congestion  Continue on Oxygen 2l/m with activity and At bedtime  .  Follow up Dr. Elsworth Soho  In 2-3 months  and As needed   Please contact office for sooner follow up if symptoms do not improve or worsen or seek emergency care  Great job on not smoking , -keep up good work .      Chronic respiratory failure (HCC) Cont on O2 w/ act and At bedtime       Rexene Edison, NP 04/09/2017

## 2017-04-09 NOTE — Addendum Note (Signed)
Addended by: Parke Poisson E on: 04/09/2017 09:56 AM   Modules accepted: Orders

## 2017-04-09 NOTE — Patient Instructions (Signed)
Restart Spiriva 2 puffs daily , rinse well after using  May use liquid Mucinex Twice daily  As needed  Cough /congestion  Continue on Oxygen 2l/m with activity and At bedtime  .  Follow up Dr. Elsworth Soho  In 2-3 months and As needed   Please contact office for sooner follow up if symptoms do not improve or worsen or seek emergency care  Great job on not smoking , -keep up good work .

## 2017-04-09 NOTE — Assessment & Plan Note (Signed)
Smoking cessation encouraged  Restart Spiriva   Plan Patient Instructions  Restart Spiriva 2 puffs daily , rinse well after using  May use liquid Mucinex Twice daily  As needed  Cough /congestion  Continue on Oxygen 2l/m with activity and At bedtime  .  Follow up Dr. Elsworth Soho  In 2-3 months and As needed   Please contact office for sooner follow up if symptoms do not improve or worsen or seek emergency care  Great job on not smoking , -keep up good work .

## 2017-04-09 NOTE — Assessment & Plan Note (Signed)
Cont on O2 w/ act and At bedtime   

## 2017-04-11 NOTE — Progress Notes (Signed)
Reviewed & agree with plan  

## 2017-04-17 ENCOUNTER — Ambulatory Visit (HOSPITAL_BASED_OUTPATIENT_CLINIC_OR_DEPARTMENT_OTHER): Payer: Medicare Other | Admitting: Oncology

## 2017-04-17 ENCOUNTER — Telehealth: Payer: Self-pay | Admitting: Nurse Practitioner

## 2017-04-17 VITALS — BP 113/65 | HR 99 | Temp 98.6°F | Resp 18 | Ht 61.0 in | Wt 80.3 lb

## 2017-04-17 DIAGNOSIS — C154 Malignant neoplasm of middle third of esophagus: Secondary | ICD-10-CM

## 2017-04-17 DIAGNOSIS — R1310 Dysphagia, unspecified: Secondary | ICD-10-CM | POA: Diagnosis not present

## 2017-04-17 DIAGNOSIS — J449 Chronic obstructive pulmonary disease, unspecified: Secondary | ICD-10-CM | POA: Diagnosis not present

## 2017-04-17 NOTE — Progress Notes (Signed)
  [facility] OFFICE PROGRESS NOTE   Diagnosis: Esophagus cancer  INTERVAL HISTORY:   Karen Osborn returns as scheduled. She reports feeling much better. The dyspnea has improved with the current inhaler regimen. She is getting out of the house. She continues to have solid dysphagia. She tolerates small sips of liquids. She continues feeding tube supplements. She currently takes 3 cans of Osmolite daily.  Objective:  Vital signs in last 24 hours:  Blood pressure 113/65, pulse 99, temperature 98.6 F (37 C), temperature source Oral, resp. rate 18, height 5\' 1"  (1.549 m), weight 80 lb 4.8 oz (36.4 kg), SpO2 94 %.    HEENT: Neck without mass Lymphatics: No cervical or supraclavicular nodes Resp: Distant breath sounds, scattered rhonchi, no respiratory distress Cardio: Regular rate and rhythm GI: No hepatosplenomegaly, left upper quadrant feeding tube site with mild erythema at the skin exit. Nontender. Vascular: No leg edema    Lab Results:  Lab Results  Component Value Date   WBC 5.4 02/09/2017   HGB 13.5 02/09/2017   HCT 40.2 02/09/2017   MCV 96.4 02/09/2017   PLT 242 02/09/2017   NEUTROABS 6.6 02/03/2017     Medications: I have reviewed the patient's current medications.  Assessment/Plan: 1.Squamous cell carcinoma the midesophagus  Staging CT scans 10/29/2016 with no evidence of metastatic disease; nonspecific liver lesions, right hydronephrosis, retroperitoneal lymph nodes, and sclerotic right seventh rib lesion  Staging PET scan 11/13/2016-low left jugular/supraclavicular node measuring 5 mm and SUV max of 2.6; left paratracheal node measuring8 mm and SUV max 5.8; periaorticnodes including a 6 mm node SUV max 4.4; right retrocrural hypermetabolic node measures 8 mm and SUV max of 9.4;hypermetabolism corresponding to the mid esophageal primary SUV max 15.6; no right hepatic lobe hypermetabolism; no hypermetabolism to correspond to the retroperitoneal  mild adenopathy on prior exam.  Initiation of radiation 11/25/2016 and concurrent weekly Taxol/carboplatin 11/26/2016; week 5 Taxol/carboplatin 12/31/2016; radiation completed 01/03/2017  Esophagram 03/06/2017-smooth narrowing of the thoracic esophagus  2. COPD  3. 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 Right hydronephrosis on the CT 10/29/2016  5. Dysphagia/weight loss secondary to #1  6. Rash upper back 12/10/2016. Question etiology.  7. Feeding tube placement 12/26/2016  8. Diarrhea, failure to thrive 02/12/2017. C. difficile negative 02/18/2017. Improved 02/20/2017.   Disposition:  Her overall performance status appears improved, but she continues to have solid dysphagia. 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 I recommended she see Dr. Michail Sermon to consider an endoscopy for surveillance of the esophagus cancer and to consider an esophagus dilation.  She will continue tube feedings. Karen Osborn will return for an office visit in 2 months.  15 minutes were spent with the patient today. The majority of the time was used for counseling and coordination of care.  Betsy Coder, MD  04/17/2017  9:04 AM

## 2017-04-17 NOTE — Telephone Encounter (Signed)
Appointments scheduled per 4.19.18 LOS. Patient given AVS report and calendars with future scheduled appointments. °

## 2017-04-23 ENCOUNTER — Encounter: Payer: Self-pay | Admitting: *Deleted

## 2017-04-23 ENCOUNTER — Other Ambulatory Visit: Payer: Self-pay | Admitting: *Deleted

## 2017-04-23 MED ORDER — HYDROCODONE-ACETAMINOPHEN 7.5-325 MG/15ML PO SOLN: 10.0000 mL | Freq: Four times a day (QID) | ORAL | 0 refills | Status: DC | PRN

## 2017-04-23 MED FILL — HYDROCOD-APAP 7.5-325/15ML: 7.5-325 | 3 days supply | Qty: 120 | Fill #0

## 2017-04-23 NOTE — Progress Notes (Signed)
Patient's husband here for appt with Dr. Burr Medico and spoke with Dr. Benay Spice regarding patient's new onset of back pain. He states that patient was cleaning out her closet on Sunday and has had back pain ever since.  Order received from Dr. Benay Spice for [facility] for patient and for patient to be seen tomorrow at 0830.  Patient's husband appreciative of Dr. Gearldine Shown assistance and states that he will be here for appt tomorrow with Dr. Benay Spice. Hycet prescription given to patient's husband.

## 2017-04-24 ENCOUNTER — Telehealth: Payer: Self-pay | Admitting: *Deleted

## 2017-04-24 ENCOUNTER — Ambulatory Visit (HOSPITAL_BASED_OUTPATIENT_CLINIC_OR_DEPARTMENT_OTHER): Payer: Medicare Other | Admitting: Oncology

## 2017-04-24 ENCOUNTER — Ambulatory Visit (HOSPITAL_COMMUNITY)
Admission: RE | Admit: 2017-04-24 | Discharge: 2017-04-24 | Disposition: A | Discharge status: Home / Self Care | Payer: Medicare Other | Source: Ambulatory Visit | Attending: Oncology | Admitting: Oncology

## 2017-04-24 VITALS — BP 120/80 | HR 94 | Temp 98.1°F | Resp 17 | Ht 61.0 in | Wt 79.9 lb

## 2017-04-24 DIAGNOSIS — M4184 Other forms of scoliosis, thoracic region: Secondary | ICD-10-CM | POA: Diagnosis not present

## 2017-04-24 DIAGNOSIS — M47893 Other spondylosis, cervicothoracic region: Secondary | ICD-10-CM | POA: Insufficient documentation

## 2017-04-24 DIAGNOSIS — C154 Malignant neoplasm of middle third of esophagus: Secondary | ICD-10-CM | POA: Diagnosis not present

## 2017-04-24 DIAGNOSIS — C770 Secondary and unspecified malignant neoplasm of lymph nodes of head, face and neck: Secondary | ICD-10-CM | POA: Diagnosis not present

## 2017-04-24 DIAGNOSIS — J449 Chronic obstructive pulmonary disease, unspecified: Secondary | ICD-10-CM

## 2017-04-24 DIAGNOSIS — M8588 Other specified disorders of bone density and structure, other site: Secondary | ICD-10-CM | POA: Diagnosis not present

## 2017-04-24 DIAGNOSIS — M4312 Spondylolisthesis, cervical region: Secondary | ICD-10-CM | POA: Insufficient documentation

## 2017-04-24 DIAGNOSIS — M4854XA Collapsed vertebra, not elsewhere classified, thoracic region, initial encounter for fracture: Secondary | ICD-10-CM | POA: Insufficient documentation

## 2017-04-24 DIAGNOSIS — M5382 Other specified dorsopathies, cervical region: Secondary | ICD-10-CM | POA: Insufficient documentation

## 2017-04-24 DIAGNOSIS — M546 Pain in thoracic spine: Secondary | ICD-10-CM | POA: Diagnosis not present

## 2017-04-24 MED ORDER — HYDROCODONE-ACETAMINOPHEN 7.5-325 MG/15ML PO SOLN
5.0000 mL | Freq: Once | ORAL | Status: AC
  Administered 2017-04-24: 5 mL

## 2017-04-24 MED ORDER — HYDROCODONE-ACETAMINOPHEN 7.5-325 MG/15ML PO SOLN
ORAL | Status: AC
  Filled 2017-04-24: qty 15

## 2017-04-24 NOTE — Telephone Encounter (Signed)
-----   Message from Ladell Pier, MD sent at 04/24/2017  5:19 PM EDT ----- Please call patient, xray shows mild compression near shoulder blades, call if pain not better

## 2017-04-24 NOTE — Progress Notes (Signed)
[facility] OFFICE PROGRESS NOTE   Diagnosis: Esophagus cancer  INTERVAL HISTORY:   Karen Osborn returns prior to a scheduled visit. She developed acute onset mid back pain 04/21/2017. The pain is at the level of the shoulder blades. She worked at her house on 04/20/2017 cleaning out a closet. She thinks she injured the back. No fall.  No associated symptoms. The dyspnea remains improved. She continues to have solid and liquid dysphagia. She was scheduled for an endoscopy on 04/21/2017, but this was canceled. She has rescheduled for next week. We prescribed hydrocodone elixir yesterday. She was able to sleep. The pain persist today. No numbness or weakness.  Objective:  Vital signs in last 24 hours:  Blood pressure 120/80, pulse 94, temperature 98.1 F (36.7 C), temperature source Oral, resp. rate 17, height 5\' 1"  (1.549 m), weight 79 lb 14.4 oz (36.2 kg), SpO2 98 %.    Resp: Distant breath sounds, no respiratory distress Cardio: Regular rate and rhythm GI: No hepatosplenomegaly, nontender, left upper quadrant gastrostomy tube Vascular: No leg edema Neuro: The arm strength is intact bilaterally  Musculoskeletal: Tender at the mid thoracic spine. No mass. Skin: No rash    Lab Results:  Lab Results  Component Value Date   WBC 5.4 02/09/2017   HGB 13.5 02/09/2017   HCT 40.2 02/09/2017   MCV 96.4 02/09/2017   PLT 242 02/09/2017   NEUTROABS 6.6 02/03/2017     Medications: I have reviewed the patient's current medications.  Assessment/Plan: 1.Squamous cell carcinoma the midesophagus  Staging CT scans 10/29/2016 with no evidence of metastatic disease; nonspecific liver lesions, right hydronephrosis, retroperitoneal lymph nodes, and sclerotic right seventh rib lesion  Staging PET scan 11/13/2016-low left jugular/supraclavicular node measuring 5 mm and SUV max of 2.6; left paratracheal node measuring8 mm and SUV max 5.8; periaorticnodes including a 6 mm node  SUV max 4.4; right retrocrural hypermetabolic node measures 8 mm and SUV max of 9.4;hypermetabolism corresponding to the mid esophageal primary SUV max 15.6; no right hepatic lobe hypermetabolism; no hypermetabolism to correspond to the retroperitoneal mild adenopathy on prior exam.  Initiation of radiation 11/25/2016 and concurrent weekly Taxol/carboplatin 11/26/2016; week 5 Taxol/carboplatin 12/31/2016; radiation completed 01/03/2017  Esophagram03/07/2017-smooth narrowing of the thoracic esophagus  2. COPD  3. 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 Right hydronephrosis on the CT 10/29/2016  5. Dysphagia/weight loss secondary to #1  6. Rash upper back 12/10/2016. Question etiology.  7. Feeding tube placement 12/26/2016  8. Diarrhea, failure to thrive 02/12/2017. C. difficile negative 02/18/2017. Improved 02/20/2017.  9.  Acute mid back pain-04/21/2017    Disposition:  Ms. Denne developed acute back pain beginning on 04/21/2017. The pain is likely related to a benign musculoskeletal condition. She may have a compression fracture. We referred her for plain x-rays of the thoracic spine today. She will take hydrocodone elixir as needed for pain. If the pain is not improved over the next few days she will contact us.  We will consider additional imaging if the plain x-ray is negative and she has persistent pain.  She is scheduled for an esophagus dilatation procedure by Dr. Michail Sermon next week.  She will return for an office visit as scheduled on 06/19/2017. We will see her sooner as needed.  25 minutes were spent with the patient today. The majority of the time was used for counseling and coordination of care.  Betsy Coder, MD  04/24/2017  9:07 AM

## 2017-04-25 NOTE — Telephone Encounter (Signed)
-----   Message from Ladell Pier, MD sent at 04/24/2017  5:19 PM EDT ----- Please call patient, xray shows mild compression near shoulder blades, call if pain not better

## 2017-04-25 NOTE — Telephone Encounter (Signed)
Called and informed pt of xray results, informed pt to call if her pain persists or worsens. Pt verbalized understanding and denies any questions or concerns at this time.

## 2017-04-29 ENCOUNTER — Other Ambulatory Visit: Payer: Self-pay | Admitting: Gastroenterology

## 2017-04-30 ENCOUNTER — Other Ambulatory Visit: Payer: Self-pay | Admitting: Gastroenterology

## 2017-05-01 ENCOUNTER — Encounter (HOSPITAL_COMMUNITY): Payer: Self-pay | Admitting: *Deleted

## 2017-05-05 ENCOUNTER — Ambulatory Visit (HOSPITAL_COMMUNITY): Payer: Medicare Other | Admitting: Anesthesiology

## 2017-05-05 ENCOUNTER — Encounter (HOSPITAL_COMMUNITY)
Admission: RE | Disposition: A | Discharge status: Home / Self Care | Payer: Self-pay | Source: Ambulatory Visit | Attending: Gastroenterology

## 2017-05-05 ENCOUNTER — Ambulatory Visit (HOSPITAL_COMMUNITY): Payer: Medicare Other

## 2017-05-05 ENCOUNTER — Ambulatory Visit (HOSPITAL_COMMUNITY)
Admission: RE | Admit: 2017-05-05 | Discharge: 2017-05-05 | Disposition: A | Discharge status: Home / Self Care | Payer: Medicare Other | Source: Ambulatory Visit | Attending: Gastroenterology | Admitting: Gastroenterology

## 2017-05-05 ENCOUNTER — Encounter (HOSPITAL_COMMUNITY): Payer: Self-pay | Admitting: *Deleted

## 2017-05-05 DIAGNOSIS — R1310 Dysphagia, unspecified: Secondary | ICD-10-CM | POA: Insufficient documentation

## 2017-05-05 DIAGNOSIS — K222 Esophageal obstruction: Secondary | ICD-10-CM

## 2017-05-05 DIAGNOSIS — Z87891 Personal history of nicotine dependence: Secondary | ICD-10-CM | POA: Insufficient documentation

## 2017-05-05 DIAGNOSIS — K2900 Acute gastritis without bleeding: Secondary | ICD-10-CM | POA: Insufficient documentation

## 2017-05-05 DIAGNOSIS — Z931 Gastrostomy status: Secondary | ICD-10-CM | POA: Insufficient documentation

## 2017-05-05 HISTORY — PX: BALLOON DILATION: SHX5330

## 2017-05-05 HISTORY — PX: ESOPHAGOGASTRODUODENOSCOPY (EGD) WITH PROPOFOL: SHX5813

## 2017-05-05 SURGERY — ESOPHAGOGASTRODUODENOSCOPY (EGD) WITH PROPOFOL
Anesthesia: Monitor Anesthesia Care

## 2017-05-05 MED ORDER — LIDOCAINE 2% (20 MG/ML) 5 ML SYRINGE
INTRAMUSCULAR | Status: AC
  Filled 2017-05-05: qty 5

## 2017-05-05 MED ORDER — SODIUM CHLORIDE 0.9 % IV SOLN: INTRAVENOUS | Status: DC

## 2017-05-05 MED ORDER — PROPOFOL 10 MG/ML IV BOLUS
INTRAVENOUS | Status: AC
  Filled 2017-05-05: qty 20

## 2017-05-05 MED ORDER — LIDOCAINE 2% (20 MG/ML) 5 ML SYRINGE
INTRAMUSCULAR | Status: DC | PRN
  Administered 2017-05-05: 60 mg via INTRAVENOUS
  Administered 2017-05-05: 40 mg via INTRAVENOUS

## 2017-05-05 MED ORDER — LACTATED RINGERS IV SOLN
INTRAVENOUS | Status: DC
  Administered 2017-05-05: 1000 mL via INTRAVENOUS

## 2017-05-05 MED ORDER — PROPOFOL 10 MG/ML IV BOLUS
INTRAVENOUS | Status: DC | PRN
  Administered 2017-05-05: 50 mg via INTRAVENOUS
  Administered 2017-05-05: 20 mg via INTRAVENOUS
  Administered 2017-05-05: 30 mg via INTRAVENOUS
  Administered 2017-05-05: 40 mg via INTRAVENOUS
  Administered 2017-05-05: 20 mg via INTRAVENOUS
  Administered 2017-05-05 (×2): 30 mg via INTRAVENOUS
  Administered 2017-05-05 (×2): 20 mg via INTRAVENOUS

## 2017-05-05 SURGICAL SUPPLY — 14 items

## 2017-05-05 NOTE — H&P (Signed)
Date of Initial H&P: 04/29/17  History reviewed, patient examined, no change in status, stable for surgery.

## 2017-05-05 NOTE — Op Note (Signed)
[facility] Patient Name: Karen Osborn Procedure Date: 05/05/2017 MRN: [id number] Attending MD: Lear Ng , MD Date of Birth: 1941/04/08 CSN: [id number] Age: 76 Admit Type: Outpatient Procedure:                Upper GI endoscopy Indications:              Dysphagia, Stricture of the esophagus Providers:                Lear Ng, MD, Elmer Ramp. Tilden Dome, RN, Ralene Bathe, Technician Referring MD:             Izola Price. Harrington Challenger Medicines:                Propofol per Anesthesia, Monitored Anesthesia Care Complications:            No immediate complications. Estimated Blood Loss:     Estimated blood loss was minimal. Procedure:                Pre-Anesthesia Assessment:                           - Prior to the procedure, a History and Physical                            was performed, and patient medications and                            allergies were reviewed. The patient's tolerance of                            previous anesthesia was also reviewed. The risks                            and benefits of the procedure and the sedation                            options and risks were discussed with the patient.                            All questions were answered, and informed consent                            was obtained. Prior Anticoagulants: The patient has                            taken no previous anticoagulant or antiplatelet                            agents. ASA Grade Assessment: III - A patient with                            severe systemic disease. After reviewing the risks  and benefits, the patient was deemed in                            satisfactory condition to undergo the procedure.                           After obtaining informed consent, the endoscope was                            passed under direct vision. Throughout the                            procedure, the  patient's blood pressure, pulse, and                            oxygen saturations were monitored continuously. The                            EG-[id number] ([id number]) scope was introduced through the                            mouth, and advanced to the second part of duodenum.                            The upper GI endoscopy was performed with                            difficulty due to stricture. Successful completion                            of the procedure was aided by performing the                            maneuvers documented (below) in this report. The                            patient tolerated the procedure well. Scope In: Scope Out: Findings:      One severe (stenosis; an endoscope cannot pass) benign-appearing,       intrinsic stenosis (stricture was ulcerated; no definite mass seen) was       found 28 cm from the incisors. This measured 8 mm (inner diameter) x 5       cm (in length) and was traversed after dilation. Stricture extended from       28 cm - 33 cm from the incisors. A TTS dilator was passed through the       scope. Dilation with a 6-7-8 mm balloon (6 mm balloon passed freely       through stricture so dilation started at 7 mm size), an 08-07-09 mm       balloon and a 10-10-11 mm balloon dilator was performed to 11 mm under       fluoroscopic guidance. The dilation site was examined and showed       moderate improvement in luminal narrowing. Estimated blood loss was       minimal.  The Z-line was regular and was found 40 cm from the incisors.      There was evidence of an intact gastrostomy with a patent G-tube present       in the gastric body. This was characterized by healthy appearing mucosa.      Segmental mild inflammation characterized by congestion (edema) and       erythema was found in the prepyloric region of the stomach.      The cardia and gastric fundus were normal on retroflexion.      The examined duodenum was normal. Impression:                - Benign-appearing esophageal stenosis. Dilated.                           - Z-line regular, 40 cm from the incisors.                           - Intact gastrostomy with a patent G-tube present                            characterized by healthy appearing mucosa.                           - Acute gastritis.                           - Normal examined duodenum.                           - No specimens collected. Moderate Sedation:      N/A- Per Anesthesia Care Recommendation:           - Use Prevacid (lansoprazole) 30 mg PO daily.                           - Return to my office in 1 month.                           - Patient has a contact number available for                            emergencies. The signs and symptoms of potential                            delayed complications were discussed with the                            patient. Return to normal activities tomorrow.                            Written discharge instructions were provided to the                            patient.                           - Clear liquid diet and  advanced diet as tolerated                            to a soft diet.                           - Continue present medications. Procedure Code(s):        --- Professional ---                           [phone], Esophagogastroduodenoscopy, flexible,                            transoral; with transendoscopic balloon dilation of                            esophagus (less than 30 mm diameter)                           [id number], Intraluminal dilation of strictures and/or                            obstructions (eg, esophagus), radiological                            supervision and interpretation Diagnosis Code(s):        --- Professional ---                           K22.2, Esophageal obstruction                           R13.10, Dysphagia, unspecified                           K29.00, Acute gastritis without bleeding                           Z93.1,  Gastrostomy status CPT copyright 2016 [employer]. All rights reserved. The codes documented in this report are preliminary and upon coder review may  be revised to meet current compliance requirements. Lear Ng, MD 05/05/2017 3:11:13 PM This report has been signed electronically. Number of Addenda: 0

## 2017-05-05 NOTE — Anesthesia Postprocedure Evaluation (Addendum)
Anesthesia Post Note  Patient: Karen Osborn  Procedure(s) Performed: Procedure(s) (LRB): ESOPHAGOGASTRODUODENOSCOPY (EGD) WITH PROPOFOL (N/A) BALLOON DILATION (N/A)  Patient location during evaluation: Endoscopy Anesthesia Type: MAC Level of consciousness: awake, awake and alert and oriented Pain management: pain level controlled Vital Signs Assessment: post-procedure vital signs reviewed and stable Respiratory status: spontaneous breathing, nonlabored ventilation and respiratory function stable Cardiovascular status: blood pressure returned to baseline Anesthetic complications: no       Last Vitals:  Vitals:   05/05/17 1323 05/05/17 1502  BP: 107/72 (!) 116/57  Pulse:  77  Resp: (!) 26 20  Temp: 36.6 C     Last Pain:  Vitals:   05/05/17 1323  TempSrc: Oral                 [name]

## 2017-05-05 NOTE — Transfer of Care (Signed)
Immediate Anesthesia Transfer of Care Note  Patient: Karen Osborn  Procedure(s) Performed: Procedure(s): ESOPHAGOGASTRODUODENOSCOPY (EGD) WITH PROPOFOL (N/A) BALLOON DILATION (N/A)  Patient Location: PACU  Anesthesia Type:MAC  Level of Consciousness: awake, alert  and oriented  Airway & Oxygen Therapy: Patient Spontanous Breathing and Patient connected to nasal cannula oxygen  Post-op Assessment: Report given to RN and Post -op Vital signs reviewed and stable  Post vital signs: Reviewed and stable  Last Vitals:  Vitals:   05/05/17 1323  BP: 107/72  Resp: (!) 26  Temp: 36.6 C    Last Pain:  Vitals:   05/05/17 1323  TempSrc: Oral         Complications: No apparent anesthesia complications

## 2017-05-05 NOTE — Anesthesia Preprocedure Evaluation (Signed)
Anesthesia Evaluation  Patient identified by MRN, date of birth, ID band Patient awake    Reviewed: Allergy & Precautions, NPO status , Patient's Chart, lab work & pertinent test results  Airway Mallampati: II  TM Distance: >3 FB     Dental  (+) Teeth Intact, Dental Advisory Given   Pulmonary former smoker,    breath sounds clear to auscultation       Cardiovascular  Rhythm:Regular Rate:Normal     Neuro/Psych    GI/Hepatic   Endo/Other    Renal/GU      Musculoskeletal   Abdominal   Peds  Hematology   Anesthesia Other Findings   Reproductive/Obstetrics                             Anesthesia Physical Anesthesia Plan  ASA: III  Anesthesia Plan: MAC   Post-op Pain Management:    Induction:   Airway Management Planned: Natural Airway and Nasal Cannula  Additional Equipment:   Intra-op Plan:   Post-operative Plan:   Informed Consent: I have reviewed the patients History and Physical, chart, labs and discussed the procedure including the risks, benefits and alternatives for the proposed anesthesia with the patient or authorized representative who has indicated his/her understanding and acceptance.     Plan Discussed with: CRNA and Anesthesiologist  Anesthesia Plan Comments:         Anesthesia Quick Evaluation

## 2017-05-05 NOTE — Discharge Instructions (Addendum)
START with a CLEAR LIQUID diet and advance as tolerated to SOFT diet.

## 2017-05-05 NOTE — Interval H&P Note (Signed)
History and Physical Interval Note:  05/05/2017 1:57 PM  Karen Osborn  has presented today for surgery, with the diagnosis of dysphagia  The various methods of treatment have been discussed with the patient and family. After consideration of risks, benefits and other options for treatment, the patient has consented to  Procedure(s) with comments: ESOPHAGOGASTRODUODENOSCOPY (EGD) WITH PROPOFOL (N/A) SAVORY DILATION (N/A) - Balloon vs savory with fluro BALLOON DILATION (N/A) as a surgical intervention .  The patient's history has been reviewed, patient examined, no change in status, stable for surgery.  I have reviewed the patient's chart and labs.  Questions were answered to the patient's satisfaction.     [place] C.

## 2017-05-08 ENCOUNTER — Encounter (HOSPITAL_COMMUNITY): Payer: Self-pay | Admitting: Gastroenterology

## 2017-05-14 ENCOUNTER — Other Ambulatory Visit (HOSPITAL_COMMUNITY): Payer: Self-pay | Admitting: Interventional Radiology

## 2017-05-14 ENCOUNTER — Ambulatory Visit (HOSPITAL_COMMUNITY)
Admission: RE | Admit: 2017-05-14 | Discharge: 2017-05-14 | Disposition: A | Discharge status: Home / Self Care | Payer: Medicare Other | Source: Ambulatory Visit | Attending: Interventional Radiology | Admitting: Interventional Radiology

## 2017-05-14 ENCOUNTER — Encounter (HOSPITAL_COMMUNITY): Payer: Self-pay | Admitting: Interventional Radiology

## 2017-05-14 DIAGNOSIS — R633 Feeding difficulties, unspecified: Secondary | ICD-10-CM

## 2017-05-14 DIAGNOSIS — T85848A Pain due to other internal prosthetic devices, implants and grafts, initial encounter: Secondary | ICD-10-CM | POA: Diagnosis present

## 2017-05-14 DIAGNOSIS — Y733 Surgical instruments, materials and gastroenterology and urology devices (including sutures) associated with adverse incidents: Secondary | ICD-10-CM | POA: Diagnosis not present

## 2017-05-14 HISTORY — PX: IR CM INJ ANY COLONIC TUBE W/FLUORO: IMG2336

## 2017-05-14 MED ORDER — LIDOCAINE VISCOUS 2 % MT SOLN
OROMUCOSAL | Status: AC
  Filled 2017-05-14: qty 15

## 2017-05-14 MED ORDER — LIDOCAINE VISCOUS 2 % MT SOLN
OROMUCOSAL | Status: DC | PRN
  Administered 2017-05-14: 15 mL via OROMUCOSAL

## 2017-05-14 MED ORDER — IOPAMIDOL (ISOVUE-300) INJECTION 61%
INTRAVENOUS | Status: AC
  Administered 2017-05-14: 15 mL
  Filled 2017-05-14: qty 50

## 2017-05-14 MED ORDER — IOPAMIDOL (ISOVUE-300) INJECTION 61%
25.0000 mL | Freq: Once | INTRAVENOUS | Status: AC | PRN
  Administered 2017-05-14: 15 mL

## 2017-06-04 ENCOUNTER — Other Ambulatory Visit: Payer: Self-pay | Admitting: Gastroenterology

## 2017-06-09 ENCOUNTER — Ambulatory Visit (INDEPENDENT_AMBULATORY_CARE_PROVIDER_SITE_OTHER): Payer: Medicare Other | Admitting: Adult Health

## 2017-06-09 ENCOUNTER — Encounter: Payer: Self-pay | Admitting: Adult Health

## 2017-06-09 DIAGNOSIS — J449 Chronic obstructive pulmonary disease, unspecified: Secondary | ICD-10-CM | POA: Diagnosis not present

## 2017-06-09 DIAGNOSIS — J9611 Chronic respiratory failure with hypoxia: Secondary | ICD-10-CM

## 2017-06-09 MED ORDER — ALBUTEROL SULFATE HFA 108 (90 BASE) MCG/ACT IN AERS: INHALATION_SPRAY | RESPIRATORY_TRACT | 3 refills | Status: DC

## 2017-06-09 MED ORDER — TIOTROPIUM BROMIDE-OLODATEROL 2.5-2.5 MCG/ACT IN AERS: 2.0000 | INHALATION_SPRAY | Freq: Every day | RESPIRATORY_TRACT | 0 refills | Status: DC

## 2017-06-09 MED ORDER — TIOTROPIUM BROMIDE-OLODATEROL 2.5-2.5 MCG/ACT IN AERS: 2.0000 | INHALATION_SPRAY | Freq: Every day | RESPIRATORY_TRACT | 5 refills | Status: AC

## 2017-06-09 NOTE — Patient Instructions (Addendum)
Try Stiolto 2 puffs daily , rinse well after using ( Stop Spiriva) .  May use liquid Mucinex Twice daily  As needed  Cough /congestion  Continue on Oxygen 2l/m with activity and At bedtime  .  Follow up Dr. Elsworth Soho  In 4 months and As needed   Please contact office for sooner follow up if symptoms do not improve or worsen or seek emergency care

## 2017-06-09 NOTE — Assessment & Plan Note (Signed)
Cont on O2 w/ act and hs

## 2017-06-09 NOTE — Assessment & Plan Note (Signed)
GOLD III /IV COPD -  Trial of Stiolto in place of Spriiva .  Vaccines utd.   Plan  Patient Instructions  Try Stiolto 2 puffs daily , rinse well after using ( Stop Spiriva) .  May use liquid Mucinex Twice daily  As needed  Cough /congestion  Continue on Oxygen 2l/m with activity and At bedtime  .  Follow up Dr. Elsworth Soho  In 4 months and As needed   Please contact office for sooner follow up if symptoms do not improve or worsen or seek emergency care

## 2017-06-09 NOTE — Addendum Note (Signed)
Addended by: Valerie Salts on: 06/09/2017 09:31 AM   Modules accepted: Orders

## 2017-06-09 NOTE — Progress Notes (Signed)
@Patient  ID: Karen Osborn, female    DOB: Oct 28, 1941, 76 y.o.   MRN: [id number]  Chief Complaint  Patient presents with  . Follow-up    COPD     Referring provider: Judge Stall, FNP  HPI: 76 year old female former smoker quit 01/2017 , followed for gold 3 COPD and O2 RF -2l/m   TEST  PFT's 11/21/15 FEV1 0.83 (42 % ) ratio 47 p 18 % improvement from saba  - Spirometry 04/24/16 FEV1 0.60 (32%) Ratio 43   06/09/2017 Follow up : COPD  Patient returns for three-month follow-up. She has known severe COPD. She was recently restarted on her Spiriva last visit. Feels that her breathing is some better on Spiriva.  Does not think it last all day.  She denies a flare of cough or wheezing. No hemoptysis , chest pain, orthopnea or edema.  Walks most days. Gets winded with long walks, incline or carrying heavy items.  Prevnar and PVX are utd.   Uses oxygen 2l/m with activity and At bedtime. Feels it helps.   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 She has finished chemotherapy and radiation. Patient says she has been having dysphagia , following with GI . Having another esophageal dilation this week. Remains on feeding tube.   Allergies  Allergen Reactions  . Amoxicillin Nausea And Vomiting    Has patient had a PCN reaction causing immediate rash, facial/tongue/throat swelling, SOB or lightheadedness with hypotension: No Has patient had a PCN reaction causing severe rash involving mucus membranes or skin necrosis: No Has patient had a PCN reaction that required hospitalization No Has patient had a PCN reaction occurring within the last 10 years: Yes If all of the above answers are "NO", then may proceed with Cephalosporin use.   . Benadryl [Diphenhydramine Hcl] Other (See Comments)    Jittery, patient states "legs bouncing up and down"   . Codeine Nausea And Vomiting    Immunization History  Administered Date(s) Administered  . Influenza, High Dose  Seasonal PF 09/29/2016  . Pneumococcal Conjugate-13 12/30/2014    Past Medical History:  Diagnosis Date  . Breast cancer ([place])    left  . COPD (chronic obstructive pulmonary disease) ([place])   . Dyspnea   . GERD (gastroesophageal reflux disease)     Tobacco History: History  Smoking Status  . Former Smoker  . Packs/day: 0.50  . Years: 54.00  . Types: Cigarettes  . Quit date: 01/30/2017  Smokeless Tobacco  . Never Used   Counseling given: Not Answered   Outpatient Encounter Prescriptions as of 06/09/2017  Medication Sig  . acetaminophen (TYLENOL) 160 MG/5ML solution Take 480 mg by mouth every 6 (six) hours as needed for moderate pain.   Marland Kitchen albuterol (PROAIR HFA) 108 (90 Base) MCG/ACT inhaler 2 puffs every 4 hours as needed only  if your can't catch your breath (Patient taking differently: Inhale 2 puffs into the lungs every 4 (four) hours as needed for wheezing or shortness of breath. )  . lansoprazole (PREVACID SOLUTAB) 30 MG disintegrating tablet Take 30 mg by mouth daily.  . Nutritional Supplements (FEEDING SUPPLEMENT, OSMOLITE 1.5 CAL,) LIQD Place 237 mLs into feeding tube 5 (five) times daily. (Patient taking differently: Place 237 mLs into feeding tube 4 (four) times daily. )  . rOPINIRole (REQUIP) 0.25 MG tablet Take 0.25 mg by mouth at bedtime.  . Tiotropium Bromide Monohydrate (SPIRIVA RESPIMAT) 2.5 MCG/ACT AERS Inhale 2 puffs into the lungs daily.  Marland Kitchen trolamine salicylate (ASPERCREME)  10 % cream Apply 1 application topically as needed for muscle pain.  . Tiotropium Bromide-Olodaterol (STIOLTO RESPIMAT) 2.5-2.5 MCG/ACT AERS Inhale 2 puffs into the lungs daily.   No facility-administered encounter medications on file as of 06/09/2017.      Review of Systems  Constitutional:   No  weight loss, night sweats,  Fevers, chills,  +fatigue, or  lassitude.  HEENT:   No headaches,  Difficulty swallowing,  Tooth/dental problems, or  Sore throat,                No sneezing,  itching, ear ache, nasal congestion, post nasal drip,   CV:  No chest pain,  Orthopnea, PND, swelling in lower extremities, anasarca, dizziness, palpitations, syncope.   GI  No heartburn, indigestion, abdominal pain, nausea, vomiting, diarrhea, change in bowel habits, loss of appetite, bloody stools.   Resp:   No chest wall deformity  Skin: no rash or lesions.  GU: no dysuria, change in color of urine, no urgency or frequency.  No flank pain, no hematuria   MS:  No joint pain or swelling.  No decreased range of motion.  No back pain.    Physical Exam  BP 112/64 (BP Location: Right Arm, Patient Position: Sitting, Cuff Size: Normal)   Pulse 68   Ht 5\' 1"  (1.549 m)   Wt 77 lb 3.2 oz (35 kg)   SpO2 100%   BMI 14.59 kg/m   GEN: A/Ox3; pleasant , NAD thin and frail   HEENT:  [place]/AT,  EACs-clear, TMs-wnl, NOSE-clear, THROAT-clear, no lesions, no postnasal drip or exudate noted.   NECK:  Supple w/ fair ROM; no JVD; normal carotid impulses w/o bruits; no thyromegaly or nodules palpated; no lymphadenopathy.    RESP  Decreased BS in bases ,  no accessory muscle use, no dullness to percussion  CARD:  RRR, no m/r/g, no peripheral edema, pulses intact, no cyanosis or clubbing.  GI:   Soft & nt; nml bowel sounds; no organomegaly or masses detected.   Musco: Warm bil, no deformities or joint swelling noted.   Neuro: alert, no focal deficits noted.    Skin: Warm, no lesions or rashes    Lab Results:  CBC  BMET  BNP No results found for: BNP  ProBNP No results found for: PROBNP  Imaging: Ir Cm Inj Any Colonic Tube W/fluoro  Result Date: 05/14/2017 INDICATION: Pain gastrostomy site, feeding difficulty EXAM: FLUOROSCOPIC MANIPULATION AND INJECTION OF THE EXISTING GASTROSTOMY MEDICATIONS: NONE. ANESTHESIA/SEDATION: NONE. CONTRAST:  25 cc Isovue-300 - administered into the gastric lumen. FLUOROSCOPY TIME:  Fluoroscopy Time: minutes 30 second seconds (1.3 mGy). COMPLICATIONS:  None immediate. PROCEDURE: Informed written consent was obtained from the patient after a thorough discussion of the procedural risks, benefits and alternatives. All questions were addressed. Maximal Sterile Barrier Technique was utilized including caps, mask, sterile gowns, sterile gloves, sterile drape, hand hygiene and skin antiseptic. A timeout was performed prior to the initiation of the procedure. The existing gastrostomy was initially injected under fluoroscopy. Because of pain at the site with catheter movement, there was the suspicion that the gastrostomy had retracted into the anterior abdominal wall. Under lateral fluoroscopy, the catheter eventually was advanced back into the stomach and re-secured against the anterior gastric wall. Contrast injection confirms position in the stomach. After this manipulation pain at the site was relieved. IMPRESSION: Fluoroscopic manipulation and injection of the gastrostomy confirming position in the stomach. Gastrostomy ready for use. Electronically Signed   By: Jerilynn Mages.  Shick  M.D.   On: 05/14/2017 13:00     Assessment & Plan:   COPD  GOLD III-IV  GOLD III /IV COPD -  Trial of Stiolto in place of Spriiva .  Vaccines utd.   Plan  Patient Instructions  Try Stiolto 2 puffs daily , rinse well after using ( Stop Spiriva) .  May use liquid Mucinex Twice daily  As needed  Cough /congestion  Continue on Oxygen 2l/m with activity and At bedtime  .  Follow up Dr. Elsworth Soho  In 4 months and As needed   Please contact office for sooner follow up if symptoms do not improve or worsen or seek emergency care       Chronic respiratory failure ([place]) Cont on O2 w/ act and hs      Rexene Edison, NP 06/09/2017

## 2017-06-09 NOTE — Addendum Note (Signed)
Addended by: Valerie Salts on: 06/09/2017 09:37 AM   Modules accepted: Orders

## 2017-06-10 ENCOUNTER — Other Ambulatory Visit: Payer: Self-pay | Admitting: Gastroenterology

## 2017-06-10 NOTE — Progress Notes (Signed)
Reviewed & agree with plan  

## 2017-06-11 NOTE — Progress Notes (Signed)
Patient aware to arrive at 0730, NPO after midnight and driver present for procedure and discharge.

## 2017-06-12 ENCOUNTER — Ambulatory Visit (HOSPITAL_COMMUNITY): Payer: Medicare Other | Admitting: Anesthesiology

## 2017-06-12 ENCOUNTER — Encounter (HOSPITAL_COMMUNITY): Payer: Self-pay

## 2017-06-12 ENCOUNTER — Ambulatory Visit (HOSPITAL_COMMUNITY)
Admission: RE | Admit: 2017-06-12 | Discharge: 2017-06-12 | Disposition: A | Discharge status: Home / Self Care | Payer: Medicare Other | Source: Ambulatory Visit | Attending: Gastroenterology | Admitting: Gastroenterology

## 2017-06-12 ENCOUNTER — Encounter (HOSPITAL_COMMUNITY)
Admission: RE | Disposition: A | Discharge status: Home / Self Care | Payer: Self-pay | Source: Ambulatory Visit | Attending: Gastroenterology

## 2017-06-12 DIAGNOSIS — Z87891 Personal history of nicotine dependence: Secondary | ICD-10-CM | POA: Diagnosis not present

## 2017-06-12 DIAGNOSIS — R1310 Dysphagia, unspecified: Secondary | ICD-10-CM | POA: Diagnosis present

## 2017-06-12 DIAGNOSIS — Z8501 Personal history of malignant neoplasm of esophagus: Secondary | ICD-10-CM | POA: Insufficient documentation

## 2017-06-12 DIAGNOSIS — K222 Esophageal obstruction: Secondary | ICD-10-CM | POA: Insufficient documentation

## 2017-06-12 DIAGNOSIS — Z923 Personal history of irradiation: Secondary | ICD-10-CM | POA: Diagnosis not present

## 2017-06-12 DIAGNOSIS — Z79899 Other long term (current) drug therapy: Secondary | ICD-10-CM | POA: Insufficient documentation

## 2017-06-12 DIAGNOSIS — G2581 Restless legs syndrome: Secondary | ICD-10-CM | POA: Diagnosis not present

## 2017-06-12 DIAGNOSIS — J449 Chronic obstructive pulmonary disease, unspecified: Secondary | ICD-10-CM | POA: Insufficient documentation

## 2017-06-12 DIAGNOSIS — Z931 Gastrostomy status: Secondary | ICD-10-CM | POA: Insufficient documentation

## 2017-06-12 DIAGNOSIS — K2900 Acute gastritis without bleeding: Secondary | ICD-10-CM | POA: Diagnosis not present

## 2017-06-12 HISTORY — PX: ESOPHAGOGASTRODUODENOSCOPY (EGD) WITH PROPOFOL: SHX5813

## 2017-06-12 HISTORY — PX: SAVORY DILATION: SHX5439

## 2017-06-12 SURGERY — ESOPHAGOGASTRODUODENOSCOPY (EGD) WITH PROPOFOL
Anesthesia: Monitor Anesthesia Care

## 2017-06-12 MED ORDER — SODIUM CHLORIDE 0.9 % IV SOLN: INTRAVENOUS | Status: DC

## 2017-06-12 MED ORDER — ONDANSETRON HCL 4 MG/2ML IJ SOLN: 4.0000 mg | Freq: Once | INTRAMUSCULAR | Status: DC | PRN

## 2017-06-12 MED ORDER — PROPOFOL 10 MG/ML IV BOLUS
INTRAVENOUS | Status: DC | PRN
  Administered 2017-06-12 (×2): 20 mg via INTRAVENOUS
  Administered 2017-06-12: 10 mg via INTRAVENOUS
  Administered 2017-06-12: 20 mg via INTRAVENOUS
  Administered 2017-06-12: 10 mg via INTRAVENOUS
  Administered 2017-06-12 (×3): 20 mg via INTRAVENOUS

## 2017-06-12 MED ORDER — FENTANYL CITRATE (PF) 100 MCG/2ML IJ SOLN: 25.0000 ug | INTRAMUSCULAR | Status: DC | PRN

## 2017-06-12 MED ORDER — LACTATED RINGERS IV SOLN
INTRAVENOUS | Status: DC
  Administered 2017-06-12: 09:00:00 via INTRAVENOUS

## 2017-06-12 MED ORDER — PROPOFOL 500 MG/50ML IV EMUL
INTRAVENOUS | Status: DC | PRN
  Administered 2017-06-12: 50 ug/kg/min via INTRAVENOUS

## 2017-06-12 SURGICAL SUPPLY — 14 items

## 2017-06-12 NOTE — Anesthesia Postprocedure Evaluation (Signed)
Anesthesia Post Note  Patient: MISHAAL LANSDALE  Procedure(s) Performed: Procedure(s) (LRB): ESOPHAGOGASTRODUODENOSCOPY (EGD) WITH PROPOFOL (N/A) SAVORY DILATION with Fluoro (N/A)     Patient location during evaluation: PACU Anesthesia Type: MAC Level of consciousness: awake and alert Pain management: pain level controlled Vital Signs Assessment: post-procedure vital signs reviewed and stable Respiratory status: spontaneous breathing, nonlabored ventilation, respiratory function stable and patient connected to nasal cannula oxygen Cardiovascular status: stable and blood pressure returned to baseline Anesthetic complications: no    Last Vitals:  Vitals:   06/12/17 1003 06/12/17 1015  BP: 98/68 113/60  Pulse: 77 69  Resp: 20 18  Temp: 36.4 C     Last Pain:  Vitals:   06/12/17 1003  TempSrc: Oral                 Bryson Gavia S

## 2017-06-12 NOTE — Interval H&P Note (Signed)
History and Physical Interval Note:  06/12/2017 9:08 AM  Karen Osborn  has presented today for surgery, with the diagnosis of Dysphagia  The various methods of treatment have been discussed with the patient and family. After consideration of risks, benefits and other options for treatment, the patient has consented to  Procedure(s): ESOPHAGOGASTRODUODENOSCOPY (EGD) WITH PROPOFOL (N/A) SAVORY DILATION with Fluoro (N/A) as a surgical intervention .  The patient's history has been reviewed, patient examined, no change in status, stable for surgery.  I have reviewed the patient's chart and labs.  Questions were answered to the patient's satisfaction.     [place] C.

## 2017-06-12 NOTE — Discharge Instructions (Signed)
YOU HAD AN ENDOSCOPIC PROCEDURE TODAY: Refer to the procedure report and other information in the discharge instructions given to you for any specific questions about what was found during the examination. If this information does not answer your questions, please call [facility] office at [phone] to clarify.   YOU SHOULD EXPECT: Some feelings of bloating in the abdomen. Passage of more gas than usual. Walking can help get rid of the air that was put into your GI tract during the procedure and reduce the bloating. If you had a lower endoscopy (such as a colonoscopy or flexible sigmoidoscopy) you may notice spotting of blood in your stool or on the toilet paper. Some abdominal soreness may be present for a day or two, also.  DIET: Your first meal following the procedure should be a light meal and then it is ok to progress to your normal diet. A half-sandwich or bowl of soup is an example of a good first meal. Heavy or fried foods are harder to digest and may make you feel nauseous or bloated. Drink plenty of fluids but you should avoid alcoholic beverages for 24 hours. If you had a esophageal dilation, please see attached instructions for diet.   ACTIVITY: Your care partner should take you home directly after the procedure. You should plan to take it easy, moving slowly for the rest of the day. You can resume normal activity the day after the procedure however YOU SHOULD NOT DRIVE, use power tools, machinery or perform tasks that involve climbing or major physical exertion for 24 hours (because of the sedation medicines used during the test).   SYMPTOMS TO REPORT IMMEDIATELY: A gastroenterologist can be reached at any hour. Please call [phone]  for any of the following symptoms:  Following lower endoscopy (colonoscopy, flexible sigmoidoscopy) Excessive amounts of blood in the stool  Significant tenderness, worsening of abdominal pains  Swelling of the abdomen that is new, acute  Fever of 100 or  higher  Following upper endoscopy (EGD, EUS, ERCP, esophageal dilation) Vomiting of blood or coffee ground material  New, significant abdominal pain  New, significant chest pain or pain under the shoulder blades  Painful or persistently difficult swallowing  New shortness of breath  Black, tarry-looking or red, bloody stools  FOLLOW UP:  If any biopsies were taken you will be contacted by phone or by letter within the next 1-3 weeks. Call [phone]  if you have not heard about the biopsies in 3 weeks.  Please also call with any specific questions about appointments or follow up tests.  Esophageal Dilatation Esophageal dilatation is a procedure to open a blocked or narrowed part of the esophagus. The esophagus is the long tube in your throat that carries food and liquid from your mouth to your stomach. The procedure is also called esophageal dilation. You may need this procedure if you have a buildup of scar tissue in your esophagus that makes it difficult, painful, or even impossible to swallow. This can be caused by gastroesophageal reflux disease (GERD). In rare cases, people need this procedure because they have cancer of the esophagus or a problem with the way food moves through the esophagus. Sometimes you may need to have another dilatation to enlarge the opening of the esophagus gradually. Tell a health care provider about:  Any allergies you have.  All medicines you are taking, including vitamins, herbs, eye drops, creams, and over-the-counter medicines.  Any problems you or family members have had with anesthetic medicines.  Any  blood disorders you have.  Any surgeries you have had.  Any medical conditions you have.  Any antibiotic medicines you are required to take before dental procedures. What are the risks? Generally, this is a safe procedure. However, problems can occur and include:  Bleeding from a tear in the lining of the esophagus.  A hole (perforation) in the  esophagus.  What happens before the procedure?  Do not eat or drink anything after midnight on the night before the procedure or as directed by your health care provider.  Ask your health care provider about changing or stopping your regular medicines. This is especially important if you are taking diabetes medicines or blood thinners.  Plan to have someone take you home after the procedure. What happens during the procedure?  You will be given a medicine that makes you relaxed and sleepy (sedative).  A medicine may be sprayed or gargled to numb the back of the throat.  Your health care provider can use various instruments to do an esophageal dilatation. During the procedure, the instrument used will be placed in your mouth and passed down into your esophagus. Options include: ? Simple dilators. This instrument is carefully placed in the esophagus to stretch it. ? Guided wire bougies. In this method, a flexible tube (endoscope) is used to insert a wire into the esophagus. The dilator is passed over this wire to enlarge the esophagus. Then the wire is removed. ? Balloon dilators. An endoscope with a small balloon at the end is passed down into the esophagus. Inflating the balloon gently stretches the esophagus and opens it up. What happens after the procedure?  Your blood pressure, heart rate, breathing rate, and blood oxygen level will be monitored often until the medicines you were given have worn off.  Your throat may feel slightly sore and will probably still feel numb. This will improve slowly over time.  You will not be allowed to eat or drink until the throat numbness has resolved.  If this is a same-day procedure, you may be allowed to go home once you have been able to drink, urinate, and sit on the edge of the bed without nausea or dizziness.  If this is a same-day procedure, you should have a friend or family member with you for the next 24 hours after the procedure. This  information is not intended to replace advice given to you by your health care provider. Make sure you discuss any questions you have with your health care provider. Document Released: 02/06/2006 Document Revised: 05/23/2016 Document Reviewed: 04/27/2014 Elsevier Interactive Patient Education  2017 [employer].

## 2017-06-12 NOTE — H&P (Signed)
Date of Initial H&P: 06/04/17  History reviewed, patient examined, no change in status, stable for surgery.

## 2017-06-12 NOTE — Transfer of Care (Signed)
Immediate Anesthesia Transfer of Care Note  Patient: Karen Osborn  Procedure(s) Performed: Procedure(s): ESOPHAGOGASTRODUODENOSCOPY (EGD) WITH PROPOFOL (N/A) SAVORY DILATION with Fluoro (N/A)  Patient Location: Endoscopy Unit  Anesthesia Type:MAC  Level of Consciousness: drowsy and patient cooperative  Airway & Oxygen Therapy: Patient Spontanous Breathing and Patient connected to nasal cannula oxygen  Post-op Assessment: Report given to RN, Post -op Vital signs reviewed and stable and Patient moving all extremities X 4  Post vital signs: Reviewed and stable  Last Vitals:  Vitals:   06/12/17 0812  BP: (!) 112/57  Pulse: 74  Resp: (!) 23  Temp: 36.6 C    Last Pain:  Vitals:   06/12/17 0812  TempSrc: Oral         Complications: No apparent anesthesia complications

## 2017-06-12 NOTE — Op Note (Addendum)
[facility] Patient Name: Karen Osborn Procedure Date : 06/12/2017 MRN: [id number] Attending MD: Lear Ng , MD Date of Birth: 04/15/1941 CSN: [id number] Age: 76 Admit Type: Outpatient Procedure:                Upper GI endoscopy Indications:              Dysphagia, Stricture of the esophagus Providers:                Lear Ng, MD, Carmie End, RN,                            Marcene Duos, Technician Referring MD:              Medicines:                Propofol per Anesthesia, Monitored Anesthesia Care Complications:            No immediate complications. Estimated Blood Loss:     Estimated blood loss was minimal. Procedure:                Pre-Anesthesia Assessment:                           - Prior to the procedure, a History and Physical                            was performed, and patient medications and                            allergies were reviewed. The patient's tolerance of                            previous anesthesia was also reviewed. The risks                            and benefits of the procedure and the sedation                            options and risks were discussed with the patient.                            All questions were answered, and informed consent                            was obtained. Prior Anticoagulants: The patient has                            taken no previous anticoagulant or antiplatelet                            agents. ASA Grade Assessment: II - A patient with                            mild systemic disease. After reviewing the risks  and benefits, the patient was deemed in                            satisfactory condition to undergo the procedure.                           After obtaining informed consent, the endoscope was                            passed under direct vision. Throughout the                            procedure, the patient's blood pressure, pulse,  and                            oxygen saturations were monitored continuously. The                            [id number]-[id number]) scope was introduced through the                            mouth, and advanced to the second part of duodenum.                            The upper GI endoscopy was performed with                            difficulty due to stricture. Successful completion                            of the procedure was aided by straightening and                            shortening the scope to obtain bowel loop reduction                            and performing the maneuvers documented (below) in                            this report. The patient tolerated the procedure                            well. Scope In: Scope Out: Findings:      One severe (stenosis; an endoscope cannot pass) benign-appearing,       intrinsic stenosis was found 32 cm from the incisors. This measured 7 mm       (inner diameter) and was traversed after dilation. A guidewire was       placed under fluoroscopic guidance and the scope was withdrawn. Dilation       was performed with a Savary dilator with no resistance at 7 mm and 8 mm,       mild resistance at 9 mm, 10 mm and 11 mm and moderate resistance at 12       mm and 12.8 mm. Estimated blood  loss was minimal.      There was evidence of an intact gastrostomy with a patent G-tube present       in the gastric antrum. This was characterized by healthy appearing       mucosa.      Localized mild inflammation characterized by congestion (edema) and       erythema was found in the prepyloric region of the stomach.      The exam of the stomach was otherwise normal.      The examined duodenum was normal. Impression:               - Benign-appearing esophageal stenosis. Dilated.                           - Intact gastrostomy with a patent G-tube present                            characterized by healthy appearing mucosa.                           -  Acute gastritis.                           - Normal examined duodenum.                           - No specimens collected. Moderate Sedation:      N/A - MAC procedure Recommendation:           - Patient has a contact number available for                            emergencies. The signs and symptoms of potential                            delayed complications were discussed with the                            patient. Return to normal activities tomorrow.                            Written discharge instructions were provided to the                            patient.                           - Clear liquid diet and advance diet as tolerated                            to soft diet.                           - Continue present medications. Procedure Code(s):        --- Professional ---                           [id number], Esophagogastroduodenoscopy, flexible,  transoral; with insertion of guide wire followed by                            passage of dilator(s) through esophagus over guide                            wire                           74360, Intraluminal dilation of strictures and/or                            obstructions (eg, esophagus), radiological                            supervision and interpretation Diagnosis Code(s):        --- Professional ---                           K22.2, Esophageal obstruction                           R13.10, Dysphagia, unspecified                           K29.00, Acute gastritis without bleeding                           Z93.1, Gastrostomy status CPT copyright 2016 American Medical Association. All rights reserved. The codes documented in this report are preliminary and upon coder review may  be revised to meet current compliance requirements. Lear Ng, MD 06/12/2017 10:02:32 AM This report has been signed electronically. Number of Addenda: 0

## 2017-06-12 NOTE — Anesthesia Preprocedure Evaluation (Signed)
Anesthesia Evaluation  Patient identified by MRN, date of birth, ID band Patient awake    Reviewed: Allergy & Precautions, H&P , NPO status , Patient's Chart, lab work & pertinent test results  Airway Mallampati: II   Neck ROM: full    Dental   Pulmonary COPD, former smoker,    breath sounds clear to auscultation       Cardiovascular negative cardio ROS   Rhythm:regular Rate:Normal     Neuro/Psych    GI/Hepatic GERD  ,  Endo/Other    Renal/GU      Musculoskeletal   Abdominal   Peds  Hematology   Anesthesia Other Findings   Reproductive/Obstetrics                             Anesthesia Physical Anesthesia Plan  ASA: II  Anesthesia Plan: MAC   Post-op Pain Management:    Induction: Intravenous  PONV Risk Score and Plan: 2 and Ondansetron and Propofol  Airway Management Planned: Simple Face Mask  Additional Equipment:   Intra-op Plan:   Post-operative Plan:   Informed Consent: I have reviewed the patients History and Physical, chart, labs and discussed the procedure including the risks, benefits and alternatives for the proposed anesthesia with the patient or authorized representative who has indicated his/her understanding and acceptance.     Plan Discussed with: CRNA, Anesthesiologist and Surgeon  Anesthesia Plan Comments:         Anesthesia Quick Evaluation

## 2017-06-12 NOTE — Anesthesia Procedure Notes (Signed)
Procedure Name: MAC Date/Time: 06/12/2017 9:10 AM Performed by: Mervyn Gay Pre-anesthesia Checklist: Patient identified, Patient being monitored, Timeout performed, Emergency Drugs available and Suction available Patient Re-evaluated:Patient Re-evaluated prior to inductionOxygen Delivery Method: Nasal cannula Number of attempts: 1 Placement Confirmation: positive ETCO2 Dental Injury: Teeth and Oropharynx as per pre-operative assessment

## 2017-06-14 ENCOUNTER — Encounter (HOSPITAL_COMMUNITY): Payer: Self-pay | Admitting: Gastroenterology

## 2017-06-16 ENCOUNTER — Other Ambulatory Visit (HOSPITAL_COMMUNITY): Payer: Self-pay | Admitting: Interventional Radiology

## 2017-06-16 ENCOUNTER — Ambulatory Visit (HOSPITAL_COMMUNITY)
Admission: RE | Admit: 2017-06-16 | Discharge: 2017-06-16 | Disposition: A | Discharge status: Home / Self Care | Payer: Medicare Other | Source: Ambulatory Visit | Attending: Interventional Radiology | Admitting: Interventional Radiology

## 2017-06-16 DIAGNOSIS — R633 Feeding difficulties, unspecified: Secondary | ICD-10-CM

## 2017-06-16 MED ORDER — LIDOCAINE VISCOUS 2 % MT SOLN
OROMUCOSAL | Status: AC
  Filled 2017-06-16: qty 15

## 2017-06-19 ENCOUNTER — Ambulatory Visit (HOSPITAL_BASED_OUTPATIENT_CLINIC_OR_DEPARTMENT_OTHER): Payer: Medicare Other | Admitting: Nurse Practitioner

## 2017-06-19 ENCOUNTER — Telehealth: Payer: Self-pay | Admitting: Nurse Practitioner

## 2017-06-19 VITALS — BP 103/57 | HR 86 | Temp 98.4°F | Resp 16 | Ht 61.0 in | Wt 77.3 lb

## 2017-06-19 DIAGNOSIS — J449 Chronic obstructive pulmonary disease, unspecified: Secondary | ICD-10-CM | POA: Diagnosis not present

## 2017-06-19 DIAGNOSIS — Z853 Personal history of malignant neoplasm of breast: Secondary | ICD-10-CM

## 2017-06-19 DIAGNOSIS — B029 Zoster without complications: Secondary | ICD-10-CM | POA: Diagnosis not present

## 2017-06-19 DIAGNOSIS — C154 Malignant neoplasm of middle third of esophagus: Secondary | ICD-10-CM

## 2017-06-19 MED ORDER — VALACYCLOVIR HCL 1 G PO TABS: 1000.0000 mg | ORAL_TABLET | Freq: Three times a day (TID) | ORAL | 0 refills | Status: DC

## 2017-06-19 MED ORDER — ACYCLOVIR 200 MG/5ML PO SUSP: 800.0000 mg | Freq: Every day | ORAL | 0 refills | Status: AC

## 2017-06-19 MED FILL — valACYclovir HCL 1 GM TABS: 1 | 7 days supply | Qty: 21 | Fill #0

## 2017-06-19 NOTE — Telephone Encounter (Signed)
Scheduled appt per 6/21 los - Gave patient AVS and calender per LOS. 

## 2017-06-19 NOTE — Progress Notes (Addendum)
[place] OFFICE PROGRESS NOTE   Diagnosis: Esophagus cancer   INTERVAL HISTORY:   Ms. Bucker returns as scheduled. She overall is feeling well. She notes improvement in dysphagia following the most recent upper endoscopy/dilation procedure. Her weight is stable. Appetite remains poor. She continues nutritional supplements via the feeding tube. Back pain she was experiencing at the time of her last visit is better. She denies nausea. No constipation or diarrhea. 2 days ago she noted an erythematous pruritic rash at the left back.  Objective:  Vital signs in last 24 hours:  Blood pressure (!) 103/57, pulse 86, temperature 98.4 F (36.9 C), temperature source Oral, resp. rate 16, height 5\' 1"  (1.549 m), weight 77 lb 4.8 oz (35.1 kg), SpO2 98 %.    HEENT: No thrush or ulcers. Lymphatics: No palpable cervical, supraclavicular or axillary lymph nodes. Resp: Distant breath sounds. Cardio: Regular rate and rhythm. GI: No hepatomegaly. Left abdomen feeding tube. Vascular: No leg edema. Skin: Small area of vesicualr appearing lesions left upper lateral back.   Lab Results:  Lab Results  Component Value Date   WBC 5.4 02/09/2017   HGB 13.5 02/09/2017   HCT 40.2 02/09/2017   MCV 96.4 02/09/2017   PLT 242 02/09/2017   NEUTROABS 6.6 02/03/2017    Imaging:  No results found.  Medications: I have reviewed the patient's current medications.  Assessment/Plan: 1.Squamous cell carcinoma the midesophagus  Staging CT scans 10/29/2016 with no evidence of metastatic disease; nonspecific liver lesions, right hydronephrosis, retroperitoneal lymph nodes, and sclerotic right seventh rib lesion  Staging PET scan 11/13/2016-low left jugular/supraclavicular node measuring 5 mm and SUV max of 2.6; left paratracheal node measuring8 mm and SUV max 5.8; periaorticnodes including a 6 mm node SUV max 4.4; right retrocrural hypermetabolic node measures 8 mm and SUV max of  9.4;hypermetabolism corresponding to the mid esophageal primary SUV max 15.6; no right hepatic lobe hypermetabolism; no hypermetabolism to correspond to the retroperitoneal mild adenopathy on prior exam.  Initiation of radiation 11/25/2016 and concurrent weekly Taxol/carboplatin 11/26/2016; week 5 Taxol/carboplatin 12/31/2016; radiation completed 01/03/2017  Esophagram03/07/2017-smooth narrowing of the thoracic esophagus  Upper endoscopy 05/05/2017-benign appearing esophageal stenosis, dilated. Acute gastritis.  Upper endoscopy 06/12/2017-benign appearing esophageal stenosis. Dilated. Acute gastritis.  2. COPD  3. 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 Right hydronephrosis on the CT 10/29/2016  5. Dysphagia/weight loss secondary to #1  6. Rash upper back 12/10/2016. Question etiology.  7. Feeding tube placement 12/26/2016  8. Diarrhea, failure to thrive 02/12/2017. C. difficile negative 02/18/2017. Improved 02/20/2017.  9.  Acute mid back pain-04/21/2017; x-ray thoracic spine with minimal upper thoracic vertebral body compression fracture.  10. Herpes zoster left upper lateral back. 7 day course of Valtrex prescribed.    Disposition: Ms. Peale remains in clinical remission from esophagus cancer. She continues follow-up with Dr. Michail Sermon regarding recurrent dysphagia/stricture.  She appears to have a herpes zoster skin rash at the left upper lateral back, onset approximately 48 hours ago. She will complete a 7 day course of Valtrex.  She will return for a follow-up visit in 3 months. She will contact the office in the interim with any problems.  Patient seen with Dr. Benay Spice.    Ned Card ANP/GNP-BC   06/19/2017  9:18 AM This was a shared visit with Ned Card. Ms. Gilleland remains in clinical remission from esophagus cancer. She will continue follow-up with Dr.  Michail Sermon  for management of the esophageal stricture.  Julieanne Manson, M.D.

## 2017-08-01 NOTE — Progress Notes (Signed)
Karen Osborn 75 y.o. woman with malignant neoplasm of middle third of esophagus radiation completed 01-03-17  6 month FU.  Pain:None Skin:Skin with normal color to her chest. Nausea/Vomiting:Denies nausea since she decrease her Osmolite tube feeding three months ago. Swallowing problems/pain/swallowing dificulity: Still having swallowing problems when she tries to eat soft foods; less swallowing problems with liquids takes tiny sips.   Reports she has to get a stent placed into her esophagus to assist in keeping her esophagus open. Appetite:Poor    Osmolite 1.5 to one bottle 4 times a day with 90 cc free water before and after bolus feedings.   Weight: Wt Readings from Last 3 Encounters:  08/12/17 75 lb 3.2 oz (34.1 kg)  06/19/17 77 lb 4.8 oz (35.1 kg)  06/12/17 77 lb 3.2 oz (35 kg)  .BP 102/74   Pulse 76   Temp 98.4 F (36.9 C) (Oral)   Resp 16   Ht 5\' 1"  (1.549 m)   Wt 75 lb 3.2 oz (34.1 kg)   SpO2 100%   BMI 14.21 kg/m  Fatigue:Having fatigue all day, " states she has no energy". BP 102/74   Pulse 76   Temp 98.4 F (36.9 C) (Oral)   Resp 16   Ht 5\' 1"  (1.549 m)   Wt 75 lb 3.2 oz (34.1 kg)   SpO2 100%   BMI 14.21 kg/m

## 2017-08-06 ENCOUNTER — Other Ambulatory Visit: Payer: Self-pay | Admitting: Gastroenterology

## 2017-08-12 ENCOUNTER — Ambulatory Visit
Admission: RE | Admit: 2017-08-12 | Discharge: 2017-08-12 | Disposition: A | Discharge status: Home / Self Care | Payer: Medicare Other | Source: Ambulatory Visit | Attending: Radiation Oncology | Admitting: Radiation Oncology

## 2017-08-12 ENCOUNTER — Encounter: Payer: Self-pay | Admitting: Radiation Oncology

## 2017-08-12 VITALS — BP 102/74 | HR 76 | Temp 98.4°F | Resp 16 | Ht 61.0 in | Wt 75.2 lb

## 2017-08-12 DIAGNOSIS — K222 Esophageal obstruction: Secondary | ICD-10-CM | POA: Insufficient documentation

## 2017-08-12 DIAGNOSIS — K219 Gastro-esophageal reflux disease without esophagitis: Secondary | ICD-10-CM | POA: Diagnosis not present

## 2017-08-12 DIAGNOSIS — C154 Malignant neoplasm of middle third of esophagus: Secondary | ICD-10-CM

## 2017-08-12 DIAGNOSIS — Z87891 Personal history of nicotine dependence: Secondary | ICD-10-CM | POA: Insufficient documentation

## 2017-08-12 DIAGNOSIS — Z853 Personal history of malignant neoplasm of breast: Secondary | ICD-10-CM | POA: Insufficient documentation

## 2017-08-12 DIAGNOSIS — J449 Chronic obstructive pulmonary disease, unspecified: Secondary | ICD-10-CM | POA: Insufficient documentation

## 2017-08-12 DIAGNOSIS — E440 Moderate protein-calorie malnutrition: Secondary | ICD-10-CM

## 2017-08-13 ENCOUNTER — Telehealth: Payer: Self-pay | Admitting: *Deleted

## 2017-08-13 ENCOUNTER — Encounter (HOSPITAL_COMMUNITY): Payer: Self-pay | Admitting: *Deleted

## 2017-08-13 NOTE — Progress Notes (Signed)
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  Interval Since Last Radiation:  5 weeks   11/25/16 - 01/03/17: Esophagus treated to 45 Gy in 25 fractions of 1.8 Gy. The Esophagus was then boosted to 50.4 Gy in 3 fractions of 1.8 Gy.  Narrative:  Mrs. Karen Osborn is a pleasant 76 y.o. 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 She had locally advanced disease to the low left jugular/supraclavicular node, left paratracheal node, periaortic nodes, right retrocrural nodes and mid esophagus. She was treated with concurrent weekly Taxol and carboplatin along with radiotherapy which she completed in total in January 2018. 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 Of note her baseline weight is in the low 80s, at most, even when she was pregnant she only weighs 95 pounds. She has successfully regained about 10 pounds back, and following radiotherapy, was seen with Dr. Michail Sermon due to recurrence of dysphagia, an esophagram on 03/06/2017 revealed smooth narrowing of the thoracic esophagus, and she underwent esophageal dilation with EGD on 05/05/2017, and 06/12/2017. She has plans to undergo EGD with esophageal stent placement on 08/19/2017 with Dr. Michail Sermon. She continues to use 2 cans of Osmolite daily and supplements free water, she also is taking and oral liquids, but not able to get down much in the way of soft food. She comes today for follow-up, and was recently seen in June 2018 by Dr. Benay Spice at which time she was an NED.                          On review of systems, the patient reports that she is doing well overall. Since her last visit, she's  been able to gain 10 pounds back, and is trying to also keep working on her intake of Osmolite through her PEG tube. She continues to swallow but does notice that she has only saliva at times. She states that if she is not able to swallow visit often regurgitate up, and this is also more notable at the time when she feels that she has stenosis of her esophagus. She denies any chest pain, shortness of breath, cough, fevers, chills, night sweats, unintended weight changes. She denies any bowel or bladder disturbances, and denies abdominal pain, nausea or vomiting. She denies any new musculoskeletal or joint aches or pains, new skin lesions or concerns. A complete review of systems is obtained and is otherwise negative. Past Medical History:  Past Medical History:  Diagnosis Date  . Breast cancer ([place])    left  . COPD (chronic obstructive pulmonary disease) ([place])   . Dyspnea   . GERD (gastroesophageal reflux disease)     Past Surgical History: Past Surgical History:  Procedure Laterality Date  . BALLOON DILATION N/A 05/05/2017   Procedure: BALLOON DILATION;  Surgeon: Wilford Corner, MD;  Location: [facility] ENDOSCOPY;  Service: Endoscopy;  Laterality: N/A;  . BREAST LUMPECTOMY Left 1/132006  . ESOPHAGOGASTRODUODENOSCOPY (EGD) WITH PROPOFOL N/A 10/24/2016   Procedure: ESOPHAGOGASTRODUODENOSCOPY (EGD) WITH PROPOFOL;  Surgeon: Wilford Corner, MD;  Location: [facility] ENDOSCOPY;  Service: Endoscopy;  Laterality: N/A;  . ESOPHAGOGASTRODUODENOSCOPY (EGD) WITH PROPOFOL N/A 05/05/2017  Procedure: ESOPHAGOGASTRODUODENOSCOPY (EGD) WITH PROPOFOL;  Surgeon: Wilford Corner, MD;  Location: [facility] ENDOSCOPY;  Service: Endoscopy;  Laterality: N/A;  . ESOPHAGOGASTRODUODENOSCOPY (EGD) WITH PROPOFOL N/A 06/12/2017   Procedure: ESOPHAGOGASTRODUODENOSCOPY (EGD) WITH PROPOFOL;  Surgeon: Wilford Corner, MD;  Location: [place];  Service: Endoscopy;  Laterality: N/A;  . IR CM INJ ANY COLONIC TUBE W/FLUORO  05/14/2017  . IR  GENERIC HISTORICAL  12/26/2016   IR GASTROSTOMY TUBE MOD SED 12/26/2016 Jacqulynn Cadet, MD [facility]INTER[facility]  . IR GENERIC HISTORICAL  01/17/2017   IR PATIENT EVAL TECH 0-60 MINS [facility]-INTER[facility]  . SAVORY DILATION N/A 06/12/2017   Procedure: SAVORY DILATION with Fluoro;  Surgeon: Wilford Corner, MD;  Location: [place];  Service: Endoscopy;  Laterality: N/A;    Social History:  Social History   Social History  . Marital status: Married    Spouse name: Nadara Mustard  . Number of children: N/A  . Years of education: N/A   Occupational History  . Not on file.   Social History Main Topics  . Smoking status: Former Smoker    Packs/day: 0.50    Years: 54.00    Types: Cigarettes    Quit date: 01/30/2017  . Smokeless tobacco: Never Used  . Alcohol use No  . Drug use: No  . Sexual activity: Not on file   Other Topics Concern  . Not on file   Social History Narrative   Married, husband Nadara Mustard (who sees Dr. Burr Medico)   Live in [place]    Family History: Family History  Problem Relation Age of Onset  . Heart attack Mother   . Heart attack Father   . Arrhythmia Sister   . Cancer Sister        Breast Cancer  . COPD Brother   . Cancer Sister        Skin Cancer, Breast Cancer      ALLERGIES:  is allergic to amoxicillin; benadryl [diphenhydramine hcl]; and codeine.  Meds: Current Outpatient Prescriptions  Medication Sig Dispense Refill  . acetaminophen (TYLENOL) 160 MG/5ML solution Take 480 mg by mouth every 6 (six) hours as needed for moderate pain.     Marland Kitchen albuterol (PROAIR HFA) 108 (90 Base) MCG/ACT inhaler 2 puffs every 6 hours as needed for shortness of breath 1 Inhaler 3  . lansoprazole (PREVACID SOLUTAB) 30 MG disintegrating tablet Take 30 mg by mouth daily.    . Nutritional Supplements (FEEDING SUPPLEMENT, OSMOLITE 1.5 CAL,) LIQD Place 237 mLs into feeding tube 5 (five) times daily. (Patient taking differently: Place 237 mLs into feeding tube 2 (two) times daily. Patient has  decreased her tube feeding to twice a day because of nausea she is taking in 464 ml of Osmolite daily 237 ml  With 360 cc water at 0800 and 2000 237 ml with 360cc water) 1000 mL 0  . rOPINIRole (REQUIP) 0.25 MG tablet Take 0.25 mg by mouth at bedtime.    . Tiotropium Bromide-Olodaterol (STIOLTO RESPIMAT) 2.5-2.5 MCG/ACT AERS Inhale 2 puffs into the lungs daily. 1 Inhaler 5  . trolamine salicylate (ASPERCREME) 10 % cream Apply 1 application topically as needed for muscle pain.     No current facility-administered medications for this encounter.     Physical Findings:  height is 5\' 1"  (1.549 m) and weight is 75 lb 3.2 oz (34.1 kg). Her oral temperature is 98.4 F (36.9 C). Her blood pressure is 102/74 and her pulse is 76. Her respiration is 16 and oxygen saturation is 100%.  Pain Assessment  Pain Score: 0-No pain/10 In general this is a well appearing caucasian female in no acute distress. She is alert and oriented x4 and appropriate throughout the examination. HEENT reveals that the patient is normocephalic, atraumatic. EOMs are intact. PERRLA. Skin is intact without any evidence of gross lesions. Cardiovascular exam reveals a regular rate and rhythm, no clicks rubs or murmurs are auscultated. Chest is clear to auscultation bilaterally. Lymphatic assessment is performed and does not reveal any adenopathy in the cervical, supraclavicular, axillary, or inguinal chains. There is some thickening along the sternocleidomastoid on the left consistent with the region where she was previously radiated to the supraclavicular nodes. Abdomen has active bowel sounds in all quadrants and is intact. The abdomen is soft, non tender, non distended. Lower extremities are negative for pretibial pitting edema, deep calf tenderness, cyanosis or clubbing.   Lab Findings: Lab Results  Component Value Date   WBC 5.4 02/09/2017   HGB 13.5 02/09/2017   HCT 40.2 02/09/2017   MCV 96.4 02/09/2017   PLT 242 02/09/2017      Radiographic Findings: No results found.  Impression/Plan: 1. 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 The patient appears to be doing much better clinically since I last saw her. She isn't successful in gaining almost 10 pounds of weight since completing treatment. She continues to struggle however with stenosis of the esophagus which is dilated on 2 occasions since completing treatment, and will move forward with endoscopic stent placement on 08/19/2017 with Dr. Michail Sermon. We appreciate his attention to this patient, and are hopeful that this will help her treatment related side effects.  2. 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 As per #1, we appreciate Dr. Kathline Magic approach for this patient, and will closely follow her progress. I've asked her to return in 6 months time for continued evaluation. She is in agreement with this plan. 3.  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 The patient will continue to follow up with nutrition and continue her enteral feeding with her PEG tube. We will follow this expectantly,  she is hopeful to have this removed if she is able to eat regularly.    Carola Rhine, PAC

## 2017-08-13 NOTE — Telephone Encounter (Signed)
CALLED PATIENT TO INFORM OF FU APPT. FOR 02-18-18 @ 10:30 AM, SPOKE WITH PATIENT AND SHE IS AWARE OF THIS APPT.

## 2017-08-14 ENCOUNTER — Other Ambulatory Visit: Payer: Self-pay | Admitting: Gastroenterology

## 2017-08-19 ENCOUNTER — Ambulatory Visit (HOSPITAL_COMMUNITY): Payer: Medicare Other | Admitting: Certified Registered"

## 2017-08-19 ENCOUNTER — Observation Stay (HOSPITAL_COMMUNITY)
Admission: RE | Admit: 2017-08-19 | Discharge: 2017-08-20 | Disposition: A | Discharge status: Home / Self Care | Payer: Medicare Other | Source: Ambulatory Visit | Attending: Family Medicine | Admitting: Family Medicine

## 2017-08-19 ENCOUNTER — Ambulatory Visit (HOSPITAL_COMMUNITY): Payer: Medicare Other

## 2017-08-19 ENCOUNTER — Encounter (HOSPITAL_COMMUNITY)
Admission: RE | Disposition: A | Discharge status: Home / Self Care | Payer: Self-pay | Source: Ambulatory Visit | Attending: Internal Medicine

## 2017-08-19 ENCOUNTER — Encounter (HOSPITAL_COMMUNITY): Payer: Self-pay | Admitting: *Deleted

## 2017-08-19 ENCOUNTER — Observation Stay (HOSPITAL_COMMUNITY): Payer: Medicare Other

## 2017-08-19 DIAGNOSIS — F17210 Nicotine dependence, cigarettes, uncomplicated: Secondary | ICD-10-CM | POA: Diagnosis present

## 2017-08-19 DIAGNOSIS — J449 Chronic obstructive pulmonary disease, unspecified: Secondary | ICD-10-CM | POA: Diagnosis not present

## 2017-08-19 DIAGNOSIS — R079 Chest pain, unspecified: Secondary | ICD-10-CM | POA: Diagnosis not present

## 2017-08-19 DIAGNOSIS — K222 Esophageal obstruction: Secondary | ICD-10-CM

## 2017-08-19 DIAGNOSIS — R1310 Dysphagia, unspecified: Secondary | ICD-10-CM

## 2017-08-19 DIAGNOSIS — C50919 Malignant neoplasm of unspecified site of unspecified female breast: Secondary | ICD-10-CM | POA: Diagnosis present

## 2017-08-19 DIAGNOSIS — R0789 Other chest pain: Secondary | ICD-10-CM | POA: Diagnosis not present

## 2017-08-19 DIAGNOSIS — M549 Dorsalgia, unspecified: Secondary | ICD-10-CM

## 2017-08-19 DIAGNOSIS — K219 Gastro-esophageal reflux disease without esophagitis: Secondary | ICD-10-CM | POA: Insufficient documentation

## 2017-08-19 DIAGNOSIS — Z931 Gastrostomy status: Secondary | ICD-10-CM | POA: Insufficient documentation

## 2017-08-19 DIAGNOSIS — C159 Malignant neoplasm of esophagus, unspecified: Secondary | ICD-10-CM

## 2017-08-19 DIAGNOSIS — R52 Pain, unspecified: Secondary | ICD-10-CM | POA: Diagnosis present

## 2017-08-19 DIAGNOSIS — Z853 Personal history of malignant neoplasm of breast: Secondary | ICD-10-CM | POA: Diagnosis not present

## 2017-08-19 DIAGNOSIS — G8918 Other acute postprocedural pain: Secondary | ICD-10-CM | POA: Insufficient documentation

## 2017-08-19 DIAGNOSIS — C154 Malignant neoplasm of middle third of esophagus: Secondary | ICD-10-CM | POA: Diagnosis present

## 2017-08-19 DIAGNOSIS — J9610 Chronic respiratory failure, unspecified whether with hypoxia or hypercapnia: Secondary | ICD-10-CM | POA: Diagnosis present

## 2017-08-19 DIAGNOSIS — R112 Nausea with vomiting, unspecified: Secondary | ICD-10-CM | POA: Insufficient documentation

## 2017-08-19 DIAGNOSIS — J441 Chronic obstructive pulmonary disease with (acute) exacerbation: Secondary | ICD-10-CM | POA: Diagnosis present

## 2017-08-19 DIAGNOSIS — G8929 Other chronic pain: Secondary | ICD-10-CM | POA: Insufficient documentation

## 2017-08-19 HISTORY — PX: ESOPHAGEAL STENT PLACEMENT: SHX5540

## 2017-08-19 HISTORY — PX: ESOPHAGOGASTRODUODENOSCOPY (EGD) WITH PROPOFOL: SHX5813

## 2017-08-19 HISTORY — DX: Malignant neoplasm of esophagus, unspecified: C15.9

## 2017-08-19 HISTORY — DX: Esophageal obstruction: K22.2

## 2017-08-19 HISTORY — DX: Pneumonia, unspecified organism: J18.9

## 2017-08-19 LAB — CBC WITH DIFFERENTIAL/PLATELET
BASOS ABS: 0 10*3/uL (ref 0.0–0.1)
BASOS PCT: 0 %
EOS PCT: 1 %
Eosinophils Absolute: 0 10*3/uL (ref 0.0–0.7)
HCT: 38.7 % (ref 36.0–46.0)
Hemoglobin: 13.6 g/dL (ref 12.0–15.0)
LYMPHS PCT: 3 %
Lymphs Abs: 0.2 10*3/uL — ABNORMAL LOW (ref 0.7–4.0)
MCH: 32.2 pg (ref 26.0–34.0)
MCHC: 35.1 g/dL (ref 30.0–36.0)
MCV: 91.5 fL (ref 78.0–100.0)
MONO ABS: 0.3 10*3/uL (ref 0.1–1.0)
Monocytes Relative: 4 %
NEUTROS ABS: 6.3 10*3/uL (ref 1.7–7.7)
Neutrophils Relative %: 92 %
PLATELETS: 368 10*3/uL (ref 150–400)
RBC: 4.23 MIL/uL (ref 3.87–5.11)
RDW: 12.9 % (ref 11.5–15.5)
WBC: 6.9 10*3/uL (ref 4.0–10.5)

## 2017-08-19 LAB — COMPREHENSIVE METABOLIC PANEL
ALK PHOS: 83 U/L (ref 38–126)
ALT: 18 U/L (ref 14–54)
ANION GAP: 11 (ref 5–15)
AST: 27 U/L (ref 15–41)
Albumin: 3.6 g/dL (ref 3.5–5.0)
BILIRUBIN TOTAL: 0.7 mg/dL (ref 0.3–1.2)
BUN: 13 mg/dL (ref 6–20)
CALCIUM: 8.9 mg/dL (ref 8.9–10.3)
CO2: 24 mmol/L (ref 22–32)
Chloride: 100 mmol/L — ABNORMAL LOW (ref 101–111)
Creatinine, Ser: 0.47 mg/dL (ref 0.44–1.00)
GFR calc Af Amer: >60 mL/min (ref >60)
GLUCOSE: 108 mg/dL — AB (ref 65–99)
POTASSIUM: 4 mmol/L (ref 3.5–5.1)
Sodium: 135 mmol/L (ref 135–145)
TOTAL PROTEIN: 6.1 g/dL — AB (ref 6.5–8.1)

## 2017-08-19 LAB — TROPONIN I: TROPONIN I: 0.06 ng/mL — AB (ref <0.03)

## 2017-08-19 LAB — PROTIME-INR
INR: 1.02
Prothrombin Time: 13.4 seconds (ref 11.4–15.2)

## 2017-08-19 LAB — APTT: APTT: 29 s (ref 24–36)

## 2017-08-19 SURGERY — ESOPHAGOGASTRODUODENOSCOPY (EGD) WITH PROPOFOL
Anesthesia: Monitor Anesthesia Care

## 2017-08-19 MED ORDER — MIDAZOLAM HCL 2 MG/2ML IJ SOLN
INTRAMUSCULAR | Status: AC
  Filled 2017-08-19: qty 2

## 2017-08-19 MED ORDER — ALBUTEROL SULFATE (2.5 MG/3ML) 0.083% IN NEBU: 3.0000 mL | INHALATION_SOLUTION | Freq: Four times a day (QID) | RESPIRATORY_TRACT | Status: DC | PRN

## 2017-08-19 MED ORDER — LANSOPRAZOLE 30 MG PO TBDP: 30.0000 mg | ORAL_TABLET | Freq: Every day | ORAL | Status: DC

## 2017-08-19 MED ORDER — PANTOPRAZOLE SODIUM 40 MG PO PACK: 40.0000 mg | PACK | Freq: Every day | ORAL | Status: DC

## 2017-08-19 MED ORDER — PROPOFOL 10 MG/ML IV BOLUS
INTRAVENOUS | Status: AC
  Filled 2017-08-19: qty 40

## 2017-08-19 MED ORDER — SODIUM CHLORIDE 0.9 % IV SOLN
INTRAVENOUS | Status: DC
  Administered 2017-08-19: 75 mL/h via INTRAVENOUS

## 2017-08-19 MED ORDER — LIDOCAINE 2% (20 MG/ML) 5 ML SYRINGE
INTRAMUSCULAR | Status: DC | PRN
  Administered 2017-08-19: 20 mg via INTRAVENOUS

## 2017-08-19 MED ORDER — ROPINIROLE HCL 0.25 MG PO TABS
0.2500 mg | ORAL_TABLET | Freq: Every day | ORAL | Status: DC
  Filled 2017-08-19: qty 1

## 2017-08-19 MED ORDER — SODIUM CHLORIDE 0.9 % IV SOLN
INTRAVENOUS | Status: DC
  Administered 2017-08-19: 19:00:00 via INTRAVENOUS

## 2017-08-19 MED ORDER — KETOROLAC TROMETHAMINE 15 MG/ML IJ SOLN
15.0000 mg | Freq: Four times a day (QID) | INTRAMUSCULAR | Status: DC | PRN
  Administered 2017-08-19 – 2017-08-20 (×4): 15 mg via INTRAVENOUS
  Filled 2017-08-19 (×4): qty 1

## 2017-08-19 MED ORDER — DEXAMETHASONE SODIUM PHOSPHATE 10 MG/ML IJ SOLN
INTRAMUSCULAR | Status: AC
  Filled 2017-08-19: qty 2

## 2017-08-19 MED ORDER — SODIUM CHLORIDE 0.9 % IV SOLN: INTRAVENOUS | Status: DC

## 2017-08-19 MED ORDER — ONDANSETRON HCL 4 MG/2ML IJ SOLN: 4.0000 mg | Freq: Four times a day (QID) | INTRAMUSCULAR | Status: DC | PRN

## 2017-08-19 MED ORDER — FENTANYL CITRATE (PF) 100 MCG/2ML IJ SOLN
INTRAMUSCULAR | Status: AC
  Filled 2017-08-19: qty 2

## 2017-08-19 MED ORDER — SENNOSIDES-DOCUSATE SODIUM 8.6-50 MG PO TABS: 1.0000 | ORAL_TABLET | Freq: Every evening | ORAL | Status: DC | PRN

## 2017-08-19 MED ORDER — LIDOCAINE 2% (20 MG/ML) 5 ML SYRINGE
INTRAMUSCULAR | Status: AC
  Filled 2017-08-19: qty 5

## 2017-08-19 MED ORDER — PROMETHAZINE HCL 12.5 MG PO TABS
12.5000 mg | ORAL_TABLET | Freq: Four times a day (QID) | ORAL | Status: DC | PRN
  Administered 2017-08-19: 12.5 mg via ORAL
  Filled 2017-08-19 (×2): qty 1

## 2017-08-19 MED ORDER — BISACODYL 10 MG RE SUPP: 10.0000 mg | Freq: Every day | RECTAL | Status: DC | PRN

## 2017-08-19 MED ORDER — UMECLIDINIUM BROMIDE 62.5 MCG/INH IN AEPB
1.0000 | INHALATION_SPRAY | Freq: Every day | RESPIRATORY_TRACT | Status: DC
  Filled 2017-08-19: qty 7

## 2017-08-19 MED ORDER — ONDANSETRON HCL 4 MG/2ML IJ SOLN
INTRAMUSCULAR | Status: AC
  Filled 2017-08-19: qty 2

## 2017-08-19 MED ORDER — ACETAMINOPHEN 160 MG/5ML PO SOLN
480.0000 mg | Freq: Four times a day (QID) | ORAL | Status: DC | PRN
  Filled 2017-08-19: qty 20.3

## 2017-08-19 MED ORDER — OSMOLITE 1.5 CAL PO LIQD
237.0000 mL | Freq: Two times a day (BID) | ORAL | Status: DC
  Filled 2017-08-19 (×2): qty 237

## 2017-08-19 MED ORDER — LACTATED RINGERS IV SOLN
INTRAVENOUS | Status: DC
  Administered 2017-08-19: 10:00:00 via INTRAVENOUS

## 2017-08-19 MED ORDER — FENTANYL CITRATE (PF) 100 MCG/2ML IJ SOLN
25.0000 ug | Freq: Once | INTRAMUSCULAR | Status: AC
  Administered 2017-08-19: 25 ug via INTRAVENOUS

## 2017-08-19 MED ORDER — FENTANYL CITRATE (PF) 100 MCG/2ML IJ SOLN
INTRAMUSCULAR | Status: DC | PRN
  Administered 2017-08-19 (×4): 25 ug via INTRAVENOUS

## 2017-08-19 MED ORDER — PROPOFOL 10 MG/ML IV BOLUS
INTRAVENOUS | Status: DC | PRN
  Administered 2017-08-19 (×13): 20 mg via INTRAVENOUS

## 2017-08-19 MED ORDER — MORPHINE SULFATE (PF) 2 MG/ML IV SOLN: 1.0000 mg | INTRAVENOUS | Status: DC | PRN

## 2017-08-19 MED ORDER — PANTOPRAZOLE SODIUM 40 MG IV SOLR
40.0000 mg | Freq: Two times a day (BID) | INTRAVENOUS | Status: DC
  Administered 2017-08-19 – 2017-08-20 (×2): 40 mg via INTRAVENOUS
  Filled 2017-08-19 (×2): qty 40

## 2017-08-19 MED FILL — ONDANSETRON ODT 8 MG TABLET: 8 | 10 days supply | Qty: 30 | Fill #0

## 2017-08-19 MED FILL — HYDROCOD-APAP 7.5-325/15ML: 7.5-325 | 3 days supply | Qty: 300 | Fill #0

## 2017-08-19 SURGICAL SUPPLY — 14 items

## 2017-08-19 NOTE — Anesthesia Postprocedure Evaluation (Signed)
Anesthesia Post Note  Patient: Karen Osborn  Procedure(s) Performed: Procedure(s) (LRB): ESOPHAGOGASTRODUODENOSCOPY (EGD) WITH PROPOFOL (N/A) ESOPHAGEAL STENT PLACEMENT (N/A)     Patient location during evaluation: Endoscopy Anesthesia Type: MAC Level of consciousness: awake and alert Pain management: pain level controlled Vital Signs Assessment: post-procedure vital signs reviewed and stable Respiratory status: spontaneous breathing, nonlabored ventilation and respiratory function stable Cardiovascular status: stable and blood pressure returned to baseline Anesthetic complications: no Comments: No antiemetics given due to MAC procedure, and no patient complaint of nausea/vomiting.     Last Vitals:  Vitals:   08/19/17 1400 08/19/17 1410  BP: (!) 171/86 (!) 160/76  Pulse: 82 78  Resp: (!) 22 18  Temp:    SpO2: 92% 93%    Last Pain:  Vitals:   08/19/17 1410  TempSrc:   PainSc: [place]

## 2017-08-19 NOTE — H&P (Signed)
Date of Initial H&P: 08/06/17  History reviewed, patient examined, no change in status, stable for surgery.

## 2017-08-19 NOTE — Op Note (Signed)
[facility] Patient Name: Karen Osborn Procedure Date: 08/19/2017 MRN: [id number] Attending MD: Lear Ng , MD Date of Birth: 01-05-1941 CSN: [id number] Age: 76 Admit Type: Outpatient Procedure:                Upper GI endoscopy Indications:              Dysphagia, Stricture of the esophagus Providers:                Lear Ng, MD, Clarene Essex, MD, Cleda Daub, RN, Corliss Parish, Technician Referring MD:              Medicines:                Propofol per Anesthesia, Monitored Anesthesia Care Complications:            No immediate complications. Estimated Blood Loss:     Estimated blood loss was minimal. Procedure:                Pre-Anesthesia Assessment:                           - Prior to the procedure, a History and Physical                            was performed, and patient medications and                            allergies were reviewed. The patient's tolerance of                            previous anesthesia was also reviewed. The risks                            and benefits of the procedure and the sedation                            options and risks were discussed with the patient.                            All questions were answered, and informed consent                            was obtained. Prior Anticoagulants: The patient has                            taken no previous anticoagulant or antiplatelet                            agents. ASA Grade Assessment: II - A patient with                            mild systemic disease. After reviewing the risks  and benefits, the patient was deemed in                            satisfactory condition to undergo the procedure.                           After obtaining informed consent, the endoscope was                            passed under direct vision. Throughout the                            procedure, the patient's blood  pressure, pulse, and                            oxygen saturations were monitored continuously. The                            [id number] ([id number]) scope was introduced through the                            mouth, and advanced to the duodenal bulb. The upper                            GI endoscopy was accomplished without difficulty.                            The patient tolerated the procedure well. Scope In: Scope Out: Findings:      One moderate (circumferential scarring or stenosis; an endoscope may       pass) benign-appearing, intrinsic stenosis was found 28 to 31 cm from       the incisors. This measured 8 mm (inner diameter) and was traversed.       This was stented with a 23 mm x 10.5 cm WallFlex covered stent under       fluoroscopic guidance. Proximal end of the stent at 25 cm from the       incisors. Estimated blood loss was minimal.      The Z-line was regular and was found 41 cm from the incisors.      There was evidence of an intact gastrostomy with a patent G-tube present       in the gastric antrum. This was characterized by healthy appearing       mucosa.      The exam of the stomach was otherwise normal. Impression:               - Benign-appearing esophageal stenosis. Prosthesis                            placed.                           - Z-line regular, 41 cm from the incisors.                           - Intact gastrostomy with a patent G-tube present  characterized by healthy appearing mucosa.                           - No specimens collected. Moderate Sedation:      N/A- Per Anesthesia Care Recommendation:           - Patient has a contact number available for                            emergencies. The signs and symptoms of potential                            delayed complications were discussed with the                            patient. Return to normal activities tomorrow.                            Written discharge  instructions were provided to the                            patient.                           - Advance diet as tolerated and clear liquid diet.                           - Use Prevacid (lansoprazole) 30 mg PO BID.                           - Return to my office in 1 month.                           - Continue present medications. Procedure Code(s):        --- Professional ---                           [phone], Esophagogastroduodenoscopy, flexible,                            transoral; with placement of endoscopic stent                            (includes pre- and post-dilation and guide wire                            passage, when performed)                           [id number], Intraluminal dilation of strictures and/or                            obstructions (eg, esophagus), radiological                            supervision and interpretation Diagnosis Code(s):        ---  Professional ---                           R13.10, Dysphagia, unspecified                           K22.2, Esophageal obstruction                           Z93.1, Gastrostomy status CPT copyright 2016 American [employer] Association. All rights reserved. The codes documented in this report are preliminary and upon coder review may  be revised to meet current compliance requirements. Lear Ng, MD 08/19/2017 12:43:41 PM This report has been signed electronically. Clarene Essex, MD Number of Addenda: 0

## 2017-08-19 NOTE — Progress Notes (Signed)
Lab called with Troponin result of 0.06. Paged Joycie Peek, NP to inform of results.

## 2017-08-19 NOTE — Transfer of Care (Signed)
Immediate Anesthesia Transfer of Care Note  Patient: Karen Osborn  Procedure(s) Performed: Procedure(s) with comments: ESOPHAGOGASTRODUODENOSCOPY (EGD) WITH PROPOFOL (N/A) - with stent placement- metal, removable, covered- 10-12 18-23. ESOPHAGEAL STENT PLACEMENT (N/A)  Patient Location: PACU and Endoscopy Unit  Anesthesia Type:MAC  Level of Consciousness: awake and alert   Airway & Oxygen Therapy: Patient Spontanous Breathing and Patient connected to nasal cannula oxygen  Post-op Assessment: Report given to RN and Post -op Vital signs reviewed and stable  Post vital signs: Reviewed and stable  Last Vitals:  Vitals:   08/19/17 1230 08/19/17 1240  BP: (!) (P) 153/88   Pulse: (P) 80 (P) 72  Resp: (!) (P) 23 (P) 18  Temp:    SpO2: (P) 100%     Last Pain:  Vitals:   08/19/17 1227  TempSrc: Oral  PainSc: 10-Worst pain ever         Complications: No apparent anesthesia complications

## 2017-08-19 NOTE — Anesthesia Procedure Notes (Signed)
Date/Time: 08/19/2017 11:55 AM Performed by: Cynda Familia Pre-anesthesia Checklist: Patient identified, Emergency Drugs available, Suction available and Patient being monitored Oxygen Delivery Method: Nasal cannula Placement Confirmation: positive ETCO2 and breath sounds checked- equal and bilateral Comments:  O2 for sedation

## 2017-08-19 NOTE — Progress Notes (Signed)
Pt. Status post stent placement.  In recovery pt. C/o pain 10/10 to epigastric area.  CRNA, Angie, gave 100 mcg fentanly IV per protocol.  Pt. Still c/o pain 7/10.  Dr. Michail Sermon at bedside.  Verbal order given by Dr. Michail Sermon for additional 24mcg fentanyl IV.  Pt. Pain never reported to be below 7/10.  Pt. Experienced n/v.  Dr. Michail Sermon given update later and verbal orders received to admit patient.  Family at bedside.  Report called to RN on floor.  Laverta Baltimore, RN

## 2017-08-19 NOTE — Anesthesia Preprocedure Evaluation (Addendum)
Anesthesia Evaluation  Patient identified by MRN, date of birth, ID band Patient awake    Reviewed: Allergy & Precautions, H&P , NPO status , Patient's Chart, lab work & pertinent test results  Airway Mallampati: II   Neck ROM: full    Dental  (+) Dental Advisory Given, Chipped   Pulmonary COPD, former smoker,    breath sounds clear to auscultation       Cardiovascular negative cardio ROS   Rhythm:regular Rate:Normal     Neuro/Psych    GI/Hepatic GERD  ,  Endo/Other    Renal/GU      Musculoskeletal   Abdominal   Peds  Hematology   Anesthesia Other Findings   Reproductive/Obstetrics                           Anesthesia Physical  Anesthesia Plan  ASA: II  Anesthesia Plan: MAC   Post-op Pain Management:    Induction: Intravenous  PONV Risk Score and Plan: 2 and Ondansetron and Propofol  Airway Management Planned: Simple Face Mask  Additional Equipment:   Intra-op Plan:   Post-operative Plan:   Informed Consent: I have reviewed the patients History and Physical, chart, labs and discussed the procedure including the risks, benefits and alternatives for the proposed anesthesia with the patient or authorized representative who has indicated his/her understanding and acceptance.     Plan Discussed with: CRNA, Anesthesiologist and Surgeon  Anesthesia Plan Comments:         Anesthesia Quick Evaluation

## 2017-08-19 NOTE — Discharge Instructions (Signed)
Start taking Lansoprazole twice a day.

## 2017-08-19 NOTE — Progress Notes (Signed)
Dr Maudie Mercury notified of cardiac monitor order and we are not telemetry.States will remove order .

## 2017-08-19 NOTE — H&P (Signed)
TRH H&P   Patient Demographics:    Karen Osborn, is a 76 y.o. female  MRN: [id number]   DOB - April 17, 1941  Admit Date - 08/19/2017  Outpatient Primary MD for the patient is Orpah Melter, MD  Referring MD/NP/PA: Wilford Corner  Outpatient Specialists:  Kavin Leech (oncology)  Patient coming from: endoscopy  No chief complaint on file.  chest pain   HPI:    Karen Osborn  is a 76 y.o. 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 Michail Sermon, and continued to have pain in the chest and therefore admission requested for pain control.      Review of systems:    In addition to the HPI above,  No Fever-chills, No Headache, No changes with Vision or hearing, No problems swallowing food or Liquids, No Cough or Shortness of Breath, No Abdominal pain, , Bowel movements are regular, No Blood in stool or Urine, No dysuria, No new skin rashes or bruises, No new joints pains-aches,  No new weakness, tingling, numbness in any extremity, No recent weight gain or loss, No polyuria, polydypsia or polyphagia, No significant Mental Stressors.  A full 10 point Review of Systems was done, except as stated above, all other Review of Systems were negative.   With Past History of the following :    Past Medical History:  Diagnosis Date  . Breast cancer ([place])    left  . COPD (chronic obstructive pulmonary disease) ([place])   . Dyspnea    with exertion   . GERD (gastroesophageal reflux disease)   . Pneumonia    hx of       Past Surgical History:  Procedure Laterality Date  . BALLOON DILATION N/A 05/05/2017   Procedure: BALLOON DILATION;  Surgeon: Wilford Corner, MD;  Location: [facility];  Service: Endoscopy;  Laterality: N/A;  . BREAST LUMPECTOMY Left 1/132006  .  ESOPHAGOGASTRODUODENOSCOPY (EGD) WITH PROPOFOL N/A 10/24/2016   Procedure: ESOPHAGOGASTRODUODENOSCOPY (EGD) WITH PROPOFOL;  Surgeon: Wilford Corner, MD;  Location: [facility] ENDOSCOPY;  Service: Endoscopy;  Laterality: N/A;  . ESOPHAGOGASTRODUODENOSCOPY (EGD) WITH PROPOFOL N/A 05/05/2017   Procedure: ESOPHAGOGASTRODUODENOSCOPY (EGD) WITH PROPOFOL;  Surgeon: Wilford Corner, MD;  Location: [facility];  Service: Endoscopy;  Laterality: N/A;  . ESOPHAGOGASTRODUODENOSCOPY (EGD) WITH PROPOFOL N/A 06/12/2017   Procedure: ESOPHAGOGASTRODUODENOSCOPY (EGD) WITH PROPOFOL;  Surgeon: Wilford Corner, MD;  Location: [place];  Service: Endoscopy;  Laterality: N/A;  . IR CM INJ ANY COLONIC TUBE W/FLUORO  05/14/2017  . IR GENERIC HISTORICAL  12/26/2016   IR GASTROSTOMY TUBE MOD SED 12/26/2016 Jacqulynn Cadet, MD [facility]INTER[facility]  . IR GENERIC HISTORICAL  01/17/2017   IR PATIENT EVAL TECH 0-60 MINS [facility]  . SAVORY DILATION N/A 06/12/2017   Procedure: SAVORY DILATION with Fluoro;  Surgeon: Wilford Corner, MD;  Location: [place];  Service:  Endoscopy;  Laterality: N/A;      Social History:     Social History  Substance Use Topics  . Smoking status: Former Smoker    Packs/day: 0.50    Years: 54.00    Types: Cigarettes    Quit date: 01/30/2017  . Smokeless tobacco: Never Used  . Alcohol use No     Lives - at home with husband  Mobility - walk by self   Family History :     Family History  Problem Relation Age of Onset  . Heart attack Mother   . Heart attack Father   . Arrhythmia Sister   . Cancer Sister        Breast Cancer  . COPD Brother   . Cancer Sister        Skin Cancer, Breast Cancer      Home Medications:   Prior to Admission medications   Medication Sig Start Date End Date Taking? Authorizing Provider  acetaminophen (TYLENOL) 160 MG/5ML solution Take 480 mg by mouth every 6 (six) hours as needed for moderate pain.    Yes [provider]  albuterol  (PROAIR HFA) 108 (90 Base) MCG/ACT inhaler 2 puffs every 6 hours as needed for shortness of breath 06/09/17  Yes Parrett, Tammy S, NP  lansoprazole (PREVACID SOLUTAB) 30 MG disintegrating tablet Take 30 mg by mouth daily.   Yes [provider]  Nutritional Supplements (FEEDING SUPPLEMENT, OSMOLITE 1.5 CAL,) LIQD Place 237 mLs into feeding tube 5 (five) times daily. Patient taking differently: Place 237 mLs into feeding tube 2 (two) times daily. Patient has decreased her tube feeding to twice a day because of nausea she is taking in 464 ml of Osmolite daily 237 ml  With 360 cc water at 0800 and 2000 237 ml with 360cc water 02/05/17  Yes Mikhail, Maryann, DO  rOPINIRole (REQUIP) 0.25 MG tablet Take 0.25 mg by mouth at bedtime. 1-2 hours before bedtime   Yes [provider]  Tiotropium Bromide-Olodaterol (STIOLTO RESPIMAT) 2.5-2.5 MCG/ACT AERS Inhale 2 puffs into the lungs daily. 06/09/17  Yes Parrett, Tammy S, NP  trolamine salicylate (ASPERCREME) 10 % cream Apply 1 application topically as needed for muscle pain.   Yes [provider]     Allergies:     Allergies  Allergen Reactions  . Amoxicillin Nausea And Vomiting    Has patient had a PCN reaction causing immediate rash, facial/tongue/throat swelling, SOB or lightheadedness with hypotension: No Has patient had a PCN reaction causing severe rash involving mucus membranes or skin necrosis: No Has patient had a PCN reaction that required hospitalization No Has patient had a PCN reaction occurring within the last 10 years: Yes If all of the above answers are "NO", then may proceed with Cephalosporin use.   . Benadryl [Diphenhydramine Hcl] Other (See Comments)    Jittery, patient states "legs bouncing up and down"   . Codeine Nausea And Vomiting     Physical Exam:   Vitals  Blood pressure (!) 141/86, pulse 81, temperature 98.2 F (36.8 C), resp. rate 18, SpO2 96 %.   1. General  lying in bed in NAD,    2.  Normal affect and insight, Not Suicidal or Homicidal, Awake Alert, Oriented X 3.  3. No F.N deficits, ALL C.Nerves Intact, Strength 5/5 all 4 extremities, Sensation intact all 4 extremities, Plantars down going.  4. Ears and Eyes appear Normal, Conjunctivae clear, PERRLA. Moist Oral Mucosa.  5. Supple Neck, No JVD, No cervical  lymphadenopathy appriciated, No Carotid Bruits.  6. Symmetrical Chest wall movement, Good air movement bilaterally, CTAB.  7. RRR, No Gallops, Rubs or Murmurs, No Parasternal Heave.  8. Positive Bowel Sounds, Abdomen Soft, No tenderness, No organomegaly appriciated,No rebound -guarding or rigidity.  9.  No Cyanosis, Normal Skin Turgor, No Skin Rash or Bruise.  10. Good muscle tone,  joints appear normal , no effusions, Normal ROM.  11. No Palpable Lymph Nodes in Neck or Axillae     Data Review:    CBC  Recent Labs Lab 08/19/17 1536  WBC 6.9  HGB 13.6  HCT 38.7  PLT 368  MCV 91.5  MCH 32.2  MCHC 35.1  RDW 12.9  LYMPHSABS 0.2*  MONOABS 0.3  EOSABS 0.0  BASOSABS 0.0   ------------------------------------------------------------------------------------------------------------------  Chemistries   Recent Labs Lab 08/19/17 1536  NA 135  K 4.0  CL 100*  CO2 24  GLUCOSE 108*  BUN 13  CREATININE 0.47  CALCIUM 8.9  AST 27  ALT 18  ALKPHOS 83  BILITOT 0.7   ------------------------------------------------------------------------------------------------------------------ estimated creatinine clearance is 33.7 mL/min (by C-G formula based on SCr of 0.47 mg/dL). ------------------------------------------------------------------------------------------------------------------ No results for input(s): TSH, T4TOTAL, T3FREE, THYROIDAB in the last 72 hours.  Invalid input(s): FREET3  Coagulation profile  Recent Labs Lab 08/19/17 1536  INR 1.02    ------------------------------------------------------------------------------------------------------------------- No results for input(s): DDIMER in the last 72 hours. -------------------------------------------------------------------------------------------------------------------  Cardiac Enzymes No results for input(s): CKMB, TROPONINI, MYOGLOBIN in the last 168 hours.  Invalid input(s): CK ------------------------------------------------------------------------------------------------------------------ No results found for: BNP   ---------------------------------------------------------------------------------------------------------------  Urinalysis    Component Value Date/Time   COLORURINE YELLOW 02/03/2017 [place] 02/03/2017 1820   LABSPEC 1.038 (H) 02/03/2017 1820   PHURINE 7.0 02/03/2017 1820   GLUCOSEU NEGATIVE 02/03/2017 1820   HGBUR NEGATIVE 02/03/2017 1820   BILIRUBINUR NEGATIVE 02/03/2017 1820   KETONESUR NEGATIVE 02/03/2017 1820   PROTEINUR NEGATIVE 02/03/2017 1820   NITRITE NEGATIVE 02/03/2017 1820   LEUKOCYTESUR NEGATIVE 02/03/2017 1820    ----------------------------------------------------------------------------------------------------------------   Imaging Results:    Dg Esophagus Dilation  Result Date: 08/19/2017 ESOPHAGEAL DILATATION: Fluoroscopy was provided for use by the requesting physician.  No images were obtained for radiographic interpretation.  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  Code Status FULL CODE  Likely DC to  home  Condition GUARDED    Consults called: GI  Admission status: observation  Time spent in minutes : 45 minutes   Andres Ege.D  on 08/19/2017 at 5:44 PM  Between 7am to 7pm - Pager - [phone] . After 7pm go to [email or web address] - password [facility]  [facility] - Office  [phone]

## 2017-08-19 NOTE — Interval H&P Note (Signed)
History and Physical Interval Note:  08/19/2017 10:24 AM  Karen Osborn  has presented today for surgery, with the diagnosis of esophageal obstruction  The various methods of treatment have been discussed with the patient and family. After consideration of risks, benefits and other options for treatment, the patient has consented to  Procedure(s) with comments: ESOPHAGOGASTRODUODENOSCOPY (EGD) WITH PROPOFOL (N/A) - with stent placement- metal, removable, covered- 10-12 18-23. ESOPHAGEAL STENT PLACEMENT (N/A) as a surgical intervention .  The patient's history has been reviewed, patient examined, no change in status, stable for surgery.  I have reviewed the patient's chart and labs.  Questions were answered to the patient's satisfaction.     [place] C.

## 2017-08-20 DIAGNOSIS — R079 Chest pain, unspecified: Secondary | ICD-10-CM | POA: Diagnosis not present

## 2017-08-20 DIAGNOSIS — K222 Esophageal obstruction: Secondary | ICD-10-CM | POA: Diagnosis not present

## 2017-08-20 DIAGNOSIS — J449 Chronic obstructive pulmonary disease, unspecified: Secondary | ICD-10-CM | POA: Diagnosis not present

## 2017-08-20 DIAGNOSIS — G8918 Other acute postprocedural pain: Secondary | ICD-10-CM | POA: Diagnosis not present

## 2017-08-20 DIAGNOSIS — R0789 Other chest pain: Secondary | ICD-10-CM | POA: Diagnosis not present

## 2017-08-20 DIAGNOSIS — C159 Malignant neoplasm of esophagus, unspecified: Secondary | ICD-10-CM | POA: Diagnosis not present

## 2017-08-20 LAB — GLUCOSE, CAPILLARY: GLUCOSE-CAPILLARY: 107 mg/dL — AB (ref 65–99)

## 2017-08-20 LAB — COMPREHENSIVE METABOLIC PANEL
ALBUMIN: 3.4 g/dL — AB (ref 3.5–5.0)
ALT: 17 U/L (ref 14–54)
AST: 23 U/L (ref 15–41)
Alkaline Phosphatase: 79 U/L (ref 38–126)
Anion gap: 11 (ref 5–15)
BILIRUBIN TOTAL: 1.5 mg/dL — AB (ref 0.3–1.2)
BUN: 16 mg/dL (ref 6–20)
CHLORIDE: 105 mmol/L (ref 101–111)
CO2: 20 mmol/L — ABNORMAL LOW (ref 22–32)
Calcium: 8.8 mg/dL — ABNORMAL LOW (ref 8.9–10.3)
Creatinine, Ser: 0.53 mg/dL (ref 0.44–1.00)
GFR calc Af Amer: >60 mL/min (ref >60)
GFR calc non Af Amer: >60 mL/min (ref >60)
GLUCOSE: 89 mg/dL (ref 65–99)
POTASSIUM: 3.5 mmol/L (ref 3.5–5.1)
Sodium: 136 mmol/L (ref 135–145)
Total Protein: 5.5 g/dL — ABNORMAL LOW (ref 6.5–8.1)

## 2017-08-20 LAB — CBC
HEMATOCRIT: 38.1 % (ref 36.0–46.0)
Hemoglobin: 13.2 g/dL (ref 12.0–15.0)
MCH: 31.1 pg (ref 26.0–34.0)
MCHC: 34.6 g/dL (ref 30.0–36.0)
MCV: 89.6 fL (ref 78.0–100.0)
Platelets: 131 10*3/uL — ABNORMAL LOW (ref 150–400)
RBC: 4.25 MIL/uL (ref 3.87–5.11)
RDW: 12.6 % (ref 11.5–15.5)
WBC: 9.2 10*3/uL (ref 4.0–10.5)

## 2017-08-20 LAB — TROPONIN I: Troponin I: 0.09 ng/mL (ref <0.03)

## 2017-08-20 MED ORDER — KETOROLAC TROMETHAMINE 10 MG PO TABS: 10.0000 mg | ORAL_TABLET | Freq: Four times a day (QID) | ORAL | 0 refills | Status: DC | PRN

## 2017-08-20 MED FILL — KETOROLAC 10 MG TABLET: 10 | 5 days supply | Qty: 20 | Fill #0

## 2017-08-20 NOTE — Care Management Obs Status (Signed)
[place] NOTIFICATION   Patient Details  Name: Karen Osborn MRN: [id number] Date of Birth: [birth year]   Medicare Observation Status Notification Given:  Yes    Guadalupe Maple, RN 08/20/2017, 2:08 PM

## 2017-08-20 NOTE — Progress Notes (Signed)
Karen Osborn 12:52 PM  Subjective: Patient doing well wants to go home tolerating clear liquids complaints feeling better than yesterday and case discussed with her husband as well  Objective: Vital signs stable afebrile no acute distress lungs are clear heart regular rate and rhythm abdomen is soft nontender labs okay  Assessment: Status post stent placement  Plan: Okay with me to go home she and I discussed slowly advancing diet and following up in the office in one to 2 weeks but I would not remove her PEG until stent is removed and once we make sure stricture does not recur and we discussed antireflux measures as well  [facility]  Pager [phone] After 5PM or if no answer call [phone]

## 2017-08-20 NOTE — Progress Notes (Signed)
Reviewed AVS and discharge summary w/pt and husband. They verbalized understanding and questions were answered to their satisfaction. Pt discharged home in stable condition w/pain controlled and all belongings via [place].

## 2017-08-20 NOTE — Discharge Summary (Addendum)
Physician Discharge Summary  Karen Osborn  NOM:[id number]  DOB: 02-May-1941  DOA: 08/19/2017 PCP: Orpah Melter, MD  Admit date: 08/19/2017 Discharge date: 08/20/2017  Admitted From: Home  Disposition:  Home   Recommendations for Outpatient Follow-up:  1. Follow up with PCP in 1-2 weeks 2. Please obtain BMP/CBC in one week 3. Follow up with GI in 2 weeks   Discharge Condition: Stable  CODE STATUS: FULL  Diet recommendation: Clear liquids, advance as tolerated to heart healty diet   Brief/Interim Summary: Karen Osborn is a 76 year old female with squamous cell carcinoma of the esophagus with esophageal stricture who was seen by GI the date of admission for an EGD and balloon dilation, after procedure patient remained in significant pain for which was recommended to observe overnight for pain control. Patient was admitted with with ketorolac IV. Cardiac enzymes and EKG to rule out ACS although chest pain was atypical and started after endoscopic procedure was performed. Troponin were flat, and EKG did not show significant changes. Patient pain was controlled and was able to tolerate clear liquid diet. Given significant improvement patient was discharged home with pain medication and to follow-up with PCP.  Subjective: Patient seen and examined on the day of discharge. Patient report significant improvement on pain over night. At this time she is chest pain free. She report no difficulty with swallowing. He complained of back pain which is chronic for her.  Discharge Diagnoses/Hospital Course:  Atypical chest pain secondary to esophageal cancer and post op pain from EGD and balloon dilation EKG with no acute changes, no ST segment elevations or depressions. Troponins are flat, chest x-ray doesn't show any significant Pain was controlled with Toradol, prescription was given for discharge. Patient is to follow-up with PCP  Esophageal stricture status post dilation Clear liquid and  advance diet as tolerated Continue lansoprazole Follow-up with GI in 2-4 weeks  COPD Stable No changes in medications were made  Chronic Back pain Thoracic and lumbar spine x-rays show no acute process Advised to follow-up with primary care doctor for further recommendations.  All other chronic medical condition were stable during the hospitalization.  On the day of the discharge the patient's vitals were stable, and no other acute medical condition were reported by patient. Patient was felt safe to be discharge to home   Discharge Instructions  You were cared for by a hospitalist during your hospital stay. If you have any questions about your discharge medications or the care you received while you were in the hospital after you are discharged, you can call the unit and asked to speak with the hospitalist on call if the hospitalist that took care of you is not available. Once you are discharged, your primary care physician will handle any further medical issues. Please note that NO REFILLS for any discharge medications will be authorized once you are discharged, as it is imperative that you return to your primary care physician (or establish a relationship with a primary care physician if you do not have one) for your aftercare needs so that they can reassess your need for medications and monitor your lab values.  Discharge Instructions    Call MD for:  difficulty breathing, headache or visual disturbances    Complete by:  As directed    Call MD for:  extreme fatigue    Complete by:  As directed    Call MD for:  hives    Complete by:  As directed    Call MD  for:  persistant dizziness or light-headedness    Complete by:  As directed    Call MD for:  persistant nausea and vomiting    Complete by:  As directed    Call MD for:  redness, tenderness, or signs of infection (pain, swelling, redness, odor or green/yellow discharge around incision site)    Complete by:  As directed    Call MD  for:  severe uncontrolled pain    Complete by:  As directed    Call MD for:  temperature >100.4    Complete by:  As directed    Diet - low sodium heart healthy    Complete by:  As directed    Increase activity slowly    Complete by:  As directed      Allergies as of 08/20/2017      Reactions   Amoxicillin Nausea And Vomiting   Has patient had a PCN reaction causing immediate rash, facial/tongue/throat swelling, SOB or lightheadedness with hypotension: No Has patient had a PCN reaction causing severe rash involving mucus membranes or skin necrosis: No Has patient had a PCN reaction that required hospitalization No Has patient had a PCN reaction occurring within the last 10 years: Yes If all of the above answers are "NO", then may proceed with Cephalosporin use.   Benadryl [diphenhydramine Hcl] Other (See Comments)   Jittery, patient states "legs bouncing up and down"   Codeine Nausea And Vomiting      Medication List    TAKE these medications   acetaminophen 160 MG/5ML solution Commonly known as:  TYLENOL Take 480 mg by mouth every 6 (six) hours as needed for moderate pain.   albuterol 108 (90 Base) MCG/ACT inhaler Commonly known as:  PROAIR HFA 2 puffs every 6 hours as needed for shortness of breath   feeding supplement (OSMOLITE 1.5 CAL) Liqd Place 237 mLs into feeding tube 5 (five) times daily. What changed:  when to take this  additional instructions   ketorolac 10 MG tablet Commonly known as:  TORADOL Take 1 tablet (10 mg total) by mouth every 6 (six) hours as needed.   lansoprazole 30 MG disintegrating tablet Commonly known as:  PREVACID SOLUTAB Take 30 mg by mouth daily.   rOPINIRole 0.25 MG tablet Commonly known as:  REQUIP Take 0.25 mg by mouth at bedtime. 1-2 hours before bedtime   Tiotropium Bromide-Olodaterol 2.5-2.5 MCG/ACT Aers Commonly known as:  STIOLTO RESPIMAT Inhale 2 puffs into the lungs daily.   trolamine salicylate 10 % cream Commonly  known as:  ASPERCREME Apply 1 application topically as needed for muscle pain.            Discharge Care Instructions        Start     Ordered   08/20/17 0000  ketorolac (TORADOL) 10 MG tablet  Every 6 hours PRN     08/20/17 1239   08/20/17 0000  Increase activity slowly     08/20/17 1239   08/20/17 0000  Diet - low sodium heart healthy     08/20/17 1239   08/20/17 0000  Call MD for:  temperature >100.4     08/20/17 1239   08/20/17 0000  Call MD for:  persistant nausea and vomiting     08/20/17 1239   08/20/17 0000  Call MD for:  severe uncontrolled pain     08/20/17 1239   08/20/17 0000  Call MD for:  redness, tenderness, or signs of infection (pain, swelling, redness, odor or  green/yellow discharge around incision site)     08/20/17 1239   08/20/17 0000  Call MD for:  difficulty breathing, headache or visual disturbances     08/20/17 1239   08/20/17 0000  Call MD for:  hives     08/20/17 1239   08/20/17 0000  Call MD for:  persistant dizziness or light-headedness     08/20/17 1239   08/20/17 0000  Call MD for:  extreme fatigue     08/20/17 1239     Follow-up Information    Wilford Corner, MD In 1 month.   Specialty:  Gastroenterology Contact information: [address] [place] [address] [phone]          Allergies  Allergen Reactions  . Amoxicillin Nausea And Vomiting    Has patient had a PCN reaction causing immediate rash, facial/tongue/throat swelling, SOB or lightheadedness with hypotension: No Has patient had a PCN reaction causing severe rash involving mucus membranes or skin necrosis: No Has patient had a PCN reaction that required hospitalization No Has patient had a PCN reaction occurring within the last 10 years: Yes If all of the above answers are "NO", then may proceed with Cephalosporin use.   . Benadryl [Diphenhydramine Hcl] Other (See Comments)    Jittery, patient states "legs bouncing up and down"   . Codeine  Nausea And Vomiting    Consultations:  None    Procedures/Studies: Dg Thoracic Spine 2 View  Result Date: 08/19/2017 CLINICAL DATA:  Back pain. EXAM: THORACIC SPINE 2 VIEWS COMPARISON:  None. FINDINGS: An esophageal stent is identified. There is mild anterior wedging of 2 upper thoracic vertebral body with less than 10% loss of anterior height. One of these vertebral bodies is stable since April 24, 2017 while the other demonstrated no wedging in April of 2018. Degenerative changes are seen in the cervical spine, incompletely evaluated. No other acute abnormalities identified. IMPRESSION: 1. There is anterior wedging of 2 upper thoracic vertebral bodies. 1 of these vertebral bodies is stable since April 2018 and the very mild wedging of the other was not appreciated in April of 2018. By report, the patient's pain is not acute but has been present for several months. Recommend clinical correlation. Electronically Signed   By: Dorise Bullion III M.D   On: 08/19/2017 20:53   Dg Lumbar Spine 2-3 Views  Result Date: 08/19/2017 CLINICAL DATA:  Back pain.  No known injury. EXAM: LUMBAR SPINE - 2-3 VIEW COMPARISON:  None. FINDINGS: There is no evidence of lumbar spine fracture. Alignment is normal. Intervertebral disc spaces are maintained. IMPRESSION: Negative. Electronically Signed   By: Dorise Bullion III M.D   On: 08/19/2017 20:48   Dg Esophagus Dilation  Result Date: 08/19/2017 ESOPHAGEAL DILATATION: Fluoroscopy was provided for use by the requesting physician.  No images were obtained for radiographic interpretation.    Discharge Exam: Vitals:   08/19/17 2121 08/20/17 0537  BP: 103/90 90/63  Pulse: (!) 112 (!) 109  Resp: 20 20  Temp: 98.5 F (36.9 C) 98.9 F (37.2 C)  SpO2: 96% 94%   Vitals:   08/19/17 1521 08/19/17 1900 08/19/17 2121 08/20/17 0537  BP: (!) 141/86  103/90 90/63  Pulse: 81  (!) 112 (!) 109  Resp: 18  20 20   Temp: 98.2 F (36.8 C)  98.5 F (36.9 C) 98.9 F  (37.2 C)  TempSrc:   Oral Oral  SpO2: 96%  96% 94%  Weight:  34 kg (75 lb)  36.9 kg (81 lb 5.6 oz)  Height:  5\' 1"  (1.549 m)      General: Pt is alert, awake, not in acute distress Cardiovascular: RRR, S1/S2 +, no rubs, no gallops Respiratory: CTA bilaterally, no wheezing, no rhonchi Abdominal: Soft, NT, ND, bowel sounds + Extremities: no edema, no cyanosis   The results of significant diagnostics from this hospitalization (including imaging, microbiology, ancillary and laboratory) are listed below for reference.     Labs: BNP (last 3 results) No results for input(s): BNP in the last 8760 hours. Basic Metabolic Panel:  Recent Labs Lab 08/19/17 1536 08/20/17 0807  NA 135 136  K 4.0 3.5  CL 100* 105  CO2 24 20*  GLUCOSE 108* 89  BUN 13 16  CREATININE 0.47 0.53  CALCIUM 8.9 8.8*   Liver Function Tests:  Recent Labs Lab 08/19/17 1536 08/20/17 0807  AST 27 23  ALT 18 17  ALKPHOS 83 79  BILITOT 0.7 1.5*  PROT 6.1* 5.5*  ALBUMIN 3.6 3.4*    CBC:  Recent Labs Lab 08/19/17 1536 08/20/17 0807  WBC 6.9 9.2  NEUTROABS 6.3  --   HGB 13.6 13.2  HCT 38.7 38.1  MCV 91.5 89.6  PLT 368 131*   Cardiac Enzymes:  Recent Labs Lab 08/19/17 1905 08/20/17 0807  TROPONINI 0.06* 0.09*   Urinalysis    Component Value Date/Time   COLORURINE YELLOW 02/03/2017 1820   APPEARANCEUR CLEAR 02/03/2017 1820   LABSPEC 1.038 (H) 02/03/2017 1820   PHURINE 7.0 02/03/2017 1820   GLUCOSEU NEGATIVE 02/03/2017 1820   HGBUR NEGATIVE 02/03/2017 1820   BILIRUBINUR NEGATIVE 02/03/2017 [place] 02/03/2017 1820   PROTEINUR NEGATIVE 02/03/2017 1820   NITRITE NEGATIVE 02/03/2017 1820   LEUKOCYTESUR NEGATIVE 02/03/2017 1820    Time coordinating discharge:  25 minutes  SIGNED:  Chipper Oman, MD  [facility] 08/20/2017, 12:40 PM  Pager please text page via  [email or web address] Password [email or web address]

## 2017-08-21 NOTE — Addendum Note (Signed)
Addendum  created 08/21/17 1409 by Roberts Gaudy, MD   Sign clinical note

## 2017-08-21 NOTE — Addendum Note (Signed)
Addendum  created 08/21/17 1408 by Roberts Gaudy, MD   Sign clinical note

## 2017-08-22 ENCOUNTER — Encounter (HOSPITAL_COMMUNITY): Payer: Self-pay | Admitting: Gastroenterology

## 2017-08-26 ENCOUNTER — Emergency Department (HOSPITAL_COMMUNITY)
Admission: EM | Admit: 2017-08-26 | Discharge: 2017-08-26 | Disposition: A | Discharge status: Home / Self Care | Payer: Medicare Other | Attending: Emergency Medicine | Admitting: Emergency Medicine

## 2017-08-26 ENCOUNTER — Emergency Department (HOSPITAL_COMMUNITY): Payer: Medicare Other

## 2017-08-26 ENCOUNTER — Encounter (HOSPITAL_COMMUNITY): Payer: Self-pay | Admitting: Emergency Medicine

## 2017-08-26 DIAGNOSIS — C159 Malignant neoplasm of esophagus, unspecified: Secondary | ICD-10-CM | POA: Diagnosis not present

## 2017-08-26 DIAGNOSIS — Z853 Personal history of malignant neoplasm of breast: Secondary | ICD-10-CM | POA: Insufficient documentation

## 2017-08-26 DIAGNOSIS — R0602 Shortness of breath: Secondary | ICD-10-CM | POA: Diagnosis present

## 2017-08-26 DIAGNOSIS — J90 Pleural effusion, not elsewhere classified: Secondary | ICD-10-CM | POA: Insufficient documentation

## 2017-08-26 DIAGNOSIS — J449 Chronic obstructive pulmonary disease, unspecified: Secondary | ICD-10-CM | POA: Diagnosis not present

## 2017-08-26 DIAGNOSIS — M546 Pain in thoracic spine: Secondary | ICD-10-CM | POA: Insufficient documentation

## 2017-08-26 DIAGNOSIS — Z79899 Other long term (current) drug therapy: Secondary | ICD-10-CM | POA: Diagnosis not present

## 2017-08-26 DIAGNOSIS — Z87891 Personal history of nicotine dependence: Secondary | ICD-10-CM | POA: Insufficient documentation

## 2017-08-26 LAB — CBC WITH DIFFERENTIAL/PLATELET
Basophils Absolute: 0 10*3/uL (ref 0.0–0.1)
Basophils Relative: 0 %
EOS PCT: 1 %
Eosinophils Absolute: 0.1 10*3/uL (ref 0.0–0.7)
HEMATOCRIT: 38 % (ref 36.0–46.0)
Hemoglobin: 12.8 g/dL (ref 12.0–15.0)
Lymphocytes Relative: 6 %
Lymphs Abs: 0.5 10*3/uL — ABNORMAL LOW (ref 0.7–4.0)
MCH: 30.3 pg (ref 26.0–34.0)
MCHC: 33.7 g/dL (ref 30.0–36.0)
MCV: 90 fL (ref 78.0–100.0)
MONO ABS: 0.5 10*3/uL (ref 0.1–1.0)
MONOS PCT: 6 %
NEUTROS PCT: 87 %
Neutro Abs: 6.7 10*3/uL (ref 1.7–7.7)
PLATELETS: 215 10*3/uL (ref 150–400)
RBC: 4.22 MIL/uL (ref 3.87–5.11)
RDW: 12.4 % (ref 11.5–15.5)
WBC: 7.8 10*3/uL (ref 4.0–10.5)

## 2017-08-26 LAB — URINALYSIS, ROUTINE W REFLEX MICROSCOPIC
BILIRUBIN URINE: NEGATIVE
Glucose, UA: NEGATIVE mg/dL
Hgb urine dipstick: NEGATIVE
Ketones, ur: NEGATIVE mg/dL
Leukocytes, UA: NEGATIVE
NITRITE: NEGATIVE
PROTEIN: NEGATIVE mg/dL
SPECIFIC GRAVITY, URINE: 1.01 (ref 1.005–1.030)
pH: 7 (ref 5.0–8.0)

## 2017-08-26 LAB — COMPREHENSIVE METABOLIC PANEL
ALT: 14 U/L (ref 14–54)
ANION GAP: 8 (ref 5–15)
AST: 20 U/L (ref 15–41)
Albumin: 3.3 g/dL — ABNORMAL LOW (ref 3.5–5.0)
Alkaline Phosphatase: 77 U/L (ref 38–126)
BUN: 14 mg/dL (ref 6–20)
CHLORIDE: 95 mmol/L — AB (ref 101–111)
CO2: 29 mmol/L (ref 22–32)
Calcium: 8.6 mg/dL — ABNORMAL LOW (ref 8.9–10.3)
Creatinine, Ser: 0.48 mg/dL (ref 0.44–1.00)
GFR calc non Af Amer: >60 mL/min (ref >60)
Glucose, Bld: 113 mg/dL — ABNORMAL HIGH (ref 65–99)
POTASSIUM: 4.3 mmol/L (ref 3.5–5.1)
SODIUM: 132 mmol/L — AB (ref 135–145)
Total Bilirubin: 0.5 mg/dL (ref 0.3–1.2)
Total Protein: 6.5 g/dL (ref 6.5–8.1)

## 2017-08-26 LAB — PROTIME-INR
INR: 0.86
Prothrombin Time: 11.7 seconds (ref 11.4–15.2)

## 2017-08-26 LAB — I-STAT CG4 LACTIC ACID, ED: Lactic Acid, Venous: 1.05 mmol/L (ref 0.5–1.9)

## 2017-08-26 LAB — I-STAT TROPONIN, ED: TROPONIN I, POC: 0 ng/mL (ref 0.00–0.08)

## 2017-08-26 MED ORDER — SODIUM CHLORIDE 0.9 % IV BOLUS (SEPSIS)
500.0000 mL | Freq: Once | INTRAVENOUS | Status: AC
  Administered 2017-08-26: 500 mL via INTRAVENOUS

## 2017-08-26 MED ORDER — FENTANYL CITRATE (PF) 100 MCG/2ML IJ SOLN
50.0000 ug | Freq: Once | INTRAMUSCULAR | Status: AC
  Administered 2017-08-26: 50 ug via INTRAVENOUS
  Filled 2017-08-26: qty 2

## 2017-08-26 MED ORDER — IOPAMIDOL (ISOVUE-370) INJECTION 76%
100.0000 mL | Freq: Once | INTRAVENOUS | Status: AC | PRN
  Administered 2017-08-26: 70 mL via INTRAVENOUS

## 2017-08-26 MED ORDER — IOPAMIDOL (ISOVUE-370) INJECTION 76%
INTRAVENOUS | Status: AC
  Filled 2017-08-26: qty 100

## 2017-08-26 MED ORDER — ONDANSETRON HCL 4 MG/2ML IJ SOLN
4.0000 mg | Freq: Once | INTRAMUSCULAR | Status: AC
  Administered 2017-08-26: 4 mg via INTRAVENOUS
  Filled 2017-08-26: qty 2

## 2017-08-26 NOTE — ED Notes (Signed)
Bed: [facility] Expected date:  Expected time:  Means of arrival:  Comments: 

## 2017-08-26 NOTE — ED Provider Notes (Signed)
[facility] DEPT Provider Note   CSN: [id number] Arrival date & time: 08/26/17  1431     History   Chief Complaint Chief Complaint  Patient presents with  . Shortness of Breath  . Cellulitis    HPI Karen Osborn is a 76 y.o. female.  The history is provided by the patient. No language interpreter was used.  Shortness of Breath    Karen Osborn is a 76 y.o. female who presents to the Emergency Department complaining of feeling sick.  A week ago she had an esophageal stent placed for esophageal cancer. Since that time she has been feeling poorly at home with chest pain, belching, back pain, shortness of breath, hot and cold episodes. She reports no vomiting but does have frequent burping and that gives her severe left-sided chest pain. She has no stool output but is able to pass gas. She does receive peg tube feedings without difficulty.  She has a chronic cough that is nonproductive in nature. Symptoms are severe, constant, worsening. Past Medical History:  Diagnosis Date  . Breast cancer ([place])    left  . COPD (chronic obstructive pulmonary disease) ([place])   . Dyspnea    with exertion   . Esophageal stricture   . GERD (gastroesophageal reflux disease)   . Pneumonia    hx of   . Primary esophageal squamous cell carcinoma [facility])     Patient Active Problem List   Diagnosis Date Noted  . Post-op pain 08/19/2017  . Nausea and vomiting 08/19/2017  . Inadequate pain control 08/19/2017  . COPD exacerbation ([place]) 08/19/2017  . Chest pain 08/19/2017  . Back pain 08/19/2017  . Esophageal carcinoma ([place]) 08/19/2017  . Esophageal stricture 05/05/2017  . Chronic respiratory failure ([place]) 04/09/2017  . Aspiration pneumonia ([place]) 02/03/2017  . Pneumonia 02/03/2017  . Malignant neoplasm of middle third of esophagus ([place]) 10/30/2016  . Difficulty in swallowing 10/24/2016  . COPD (chronic obstructive pulmonary disease) ([place]) 04/24/2016  . Cigarette smoker 04/24/2016  .  Breast cancer ([place]) 02/18/2013  . 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 BALLOON DILATION N/A 05/05/2017   Procedure: BALLOON DILATION;  Surgeon: Wilford Corner, MD;  [facility];  Service: Endoscopy;  Laterality: N/A;  . BREAST LUMPECTOMY Left 1/132006  . ESOPHAGEAL STENT PLACEMENT N/A 08/19/2017   Procedure: ESOPHAGEAL STENT PLACEMENT;  Surgeon: Clarene Essex, MD;  [facility];  Service: Endoscopy;  Laterality: N/A;  . ESOPHAGOGASTRODUODENOSCOPY (EGD) WITH PROPOFOL N/A 10/24/2016   Procedure: ESOPHAGOGASTRODUODENOSCOPY (EGD) WITH PROPOFOL;  Surgeon: Wilford Corner, MD;  Location: [facility] ENDOSCOPY;  Service: Endoscopy;  Laterality: N/A;  . ESOPHAGOGASTRODUODENOSCOPY (EGD) WITH PROPOFOL N/A 05/05/2017   Procedure: ESOPHAGOGASTRODUODENOSCOPY (EGD) WITH PROPOFOL;  Surgeon: Wilford Corner, MD;  [facility];  Service: Endoscopy;  Laterality: N/A;  . ESOPHAGOGASTRODUODENOSCOPY (EGD) WITH PROPOFOL N/A 06/12/2017   Procedure: ESOPHAGOGASTRODUODENOSCOPY (EGD) WITH PROPOFOL;  Surgeon: Wilford Corner, MD;  Location: [facility];  Service: Endoscopy;  Laterality: N/A;  . ESOPHAGOGASTRODUODENOSCOPY (EGD) WITH PROPOFOL N/A 08/19/2017   Procedure: ESOPHAGOGASTRODUODENOSCOPY (EGD) WITH PROPOFOL;  Surgeon: Clarene Essex, MD;  [facility];  Service: Endoscopy;  Laterality: N/A;  with stent placement- metal, removable, covered- 10-12 18-23.  . IR CM INJ ANY COLONIC TUBE W/FLUORO  05/14/2017  . IR GENERIC HISTORICAL  12/26/2016   IR GASTROSTOMY TUBE MOD SED 12/26/2016 Jacqulynn Cadet, MD [facility]  . IR GENERIC HISTORICAL  01/17/2017   IR PATIENT EVAL TECH 0-60 MINS [facility]INTERV  RAD  . SAVORY DILATION N/A 06/12/2017   Procedure: SAVORY DILATION with Fluoro;  Surgeon: Wilford Corner, MD;  Location: [facility];  Service: Endoscopy;  Laterality: N/A;    OB History    No data available       Home Medications    Prior to  Admission medications   Medication Sig Start Date End Date Taking? Authorizing Provider  HYDROcodone-acetaminophen (HYCET) 7.5-325 mg/15 ml solution Take 15 mLs by mouth every 6 (six) hours as needed for moderate pain or severe pain.  08/19/17  Yes [provider]  ketorolac (TORADOL) 10 MG tablet Take 1 tablet (10 mg total) by mouth every 6 (six) hours as needed. Patient taking differently: Take 10 mg by mouth every 6 (six) hours as needed for moderate pain or severe pain.  08/20/17  Yes Patrecia Pour, Christean Grief, MD  lansoprazole (PREVACID SOLUTAB) 30 MG disintegrating tablet Take 30 mg by mouth daily.   Yes [provider]  Nutritional Supplements (FEEDING SUPPLEMENT, OSMOLITE 1.5 CAL,) LIQD Place 237 mLs into feeding tube 5 (five) times daily. Patient taking differently: Place 237 mLs into feeding tube 2 (two) times daily. Patient has decreased her tube feeding to twice a day because of nausea she is taking in 464 ml of Osmolite daily 237 ml  With 360 cc water at 0800 and 2000 237 ml with 360cc water 02/05/17  Yes Mikhail, [place], DO  ondansetron (ZOFRAN-ODT) 8 MG disintegrating tablet Take 8 mg by mouth every 8 (eight) hours as needed for nausea or vomiting.  08/19/17  Yes [provider]  rOPINIRole (REQUIP) 0.25 MG tablet Take 0.25 mg by mouth at bedtime. 1-2 hours before bedtime   Yes [provider]  albuterol (PROAIR HFA) 108 (90 Base) MCG/ACT inhaler 2 puffs every 6 hours as needed for shortness of breath 06/09/17   Parrett, Fonnie Mu, NP  Tiotropium Bromide-Olodaterol (STIOLTO RESPIMAT) 2.5-2.5 MCG/ACT AERS Inhale 2 puffs into the lungs daily. 06/09/17   Parrett, Fonnie Mu, NP  trolamine salicylate (ASPERCREME) 10 % cream Apply 1 application topically as needed for muscle pain.    [provider]    Family History Family History  Problem Relation Age of Onset  . Heart attack Mother   . Heart attack Father   . Arrhythmia Sister   . Cancer Sister         Breast Cancer  . COPD Brother   . Cancer Sister        Skin Cancer, Breast Cancer    Social History Social History  Substance Use Topics  . Smoking status: Former Smoker    Packs/day: 0.50    Years: 54.00    Types: Cigarettes    Quit date: 01/30/2017  . Smokeless tobacco: Never Used  . Alcohol use No     Allergies   Amoxicillin; Benadryl [diphenhydramine hcl]; and Codeine   Review of Systems Review of Systems  Respiratory: Positive for shortness of breath.   All other systems reviewed and are negative.    Physical Exam Updated Vital Signs BP 128/69   Pulse 84   Temp 98 F (36.7 C) (Oral)   Resp 15   Ht 5\' 1"  (1.549 m)   Wt 34 kg (75 lb)   SpO2 100%   BMI 14.17 kg/m   Physical Exam  Constitutional: She is oriented to person, place, and time. She appears well-developed.  cachectic  HENT:  Head: Normocephalic and atraumatic.  Cardiovascular: Regular rhythm.   No murmur heard. tachycardic  Pulmonary/Chest: No respiratory distress.  Tachypnea with crackles in the right lung base  Abdominal: Soft. There is no tenderness. There is no rebound and no guarding.  PEG tube in the left upper quadrant with minimal local erythema and no significant tenderness  Musculoskeletal: She exhibits no edema or tenderness.  Neurological: She is alert and oriented to person, place, and time.  Generalized weakness  Skin: Skin is warm and dry.  Psychiatric: She has a normal mood and affect. Her behavior is normal.  Nursing note and vitals reviewed.    ED Treatments / Results  Labs (all labs ordered are listed, but only abnormal results are displayed) Labs Reviewed  COMPREHENSIVE METABOLIC PANEL - Abnormal; Notable for the following:       Result Value   Sodium 132 (*)    Chloride 95 (*)    Glucose, Bld 113 (*)    Calcium 8.6 (*)    Albumin 3.3 (*)    All other components within normal limits  CULTURE, BLOOD (ROUTINE X 2)  CULTURE, BLOOD (ROUTINE X 2)  CBC WITH  DIFFERENTIAL/PLATELET  PROTIME-INR  URINALYSIS, ROUTINE W REFLEX MICROSCOPIC  I-STAT CG4 LACTIC ACID, ED  I-STAT TROPONIN, ED    EKG  EKG Interpretation  Date/Time:  Tuesday August 26 2017 15:02:04 EDT Ventricular Rate:  93 PR Interval:    QRS Duration: 80 QT Interval:  360 QTC Calculation: 448 R Axis:   79 Text Interpretation:  Sinus rhythm Anterior infarct, old No significant change since last tracing Confirmed by Merrily Pew [phone]) on 08/26/2017 3:09:15 PM       Radiology Dg Chest 2 View  Result Date: 08/26/2017 CLINICAL DATA:  Chest pain and shortness of breath EXAM: CHEST  2 VIEW COMPARISON:  February 09, 2017, thoracic spine film of August 19, 2017 FINDINGS: The heart size and mediastinal contours are stable. A stent is projected in the posterior midline. Small bilateral pleural effusions are identified. There is no focal pneumonia or pulmonary edema. The visualized skeletal structures are unremarkable. IMPRESSION: Small bilateral pleural effusions. Electronically Signed   By: Abelardo Diesel M.D.   On: 08/26/2017 15:24    Procedures Procedures (including critical care time)  Medications Ordered in ED Medications  sodium chloride 0.9 % bolus 500 mL (500 mLs Intravenous New Bag/Given 08/26/17 1635)  fentaNYL (SUBLIMAZE) injection 50 mcg (50 mcg Intravenous Given 08/26/17 1635)  ondansetron (ZOFRAN) injection 4 mg (4 mg Intravenous Given 08/26/17 1636)     Initial Impression / Assessment and Plan / ED Course  I have reviewed the triage vital signs and the nursing notes.  Pertinent labs & imaging results that were available during my care of the patient were reviewed by me and considered in my medical decision making (see chart for details).     Patient with history of esophageal cancer here with chest pain and shortness of breath and cough. She did have a stent placed a week ago and was in the hospital for treatment. Concern for possible stent migration versus PE.  Presentation is not consistent with ACS or dissection. Her symptoms are significantly improved following pain meds in the emergency department.she did have intermittent episodes of tachypnea and tearfulness. The symptoms did improve with pain control. Offered patient admission and she declined. Discussed findings of new pleural effusions on her imaging. She does state that she is short of breath but her symptoms are controlled on her home oxygen. There is no evidence of acute infectious process. Plan to DC home with  close PCP follow-up as well as return precautions for progressive symptoms. Discussed the importance of taking her home pain medications, she has been avoiding taking them due to concerns for addiction.  Final Clinical Impressions(s) / ED Diagnoses   Final diagnoses:  Acute bilateral thoracic back pain  Pleural effusion  Shortness of breath    New Prescriptions New Prescriptions   No medications on file     Quintella Reichert, MD 08/27/17 [phone]

## 2017-08-26 NOTE — ED Triage Notes (Signed)
Pt with hx of COPD and breast cancer c/o exacerbation of SOB, productive cough with yellow or dark green mucus, nausea, x 2 days. Chest pain with inspiration and increased belching onset Tuesday after having stent placed in esophagus. Constipation x 7 days. Feeding tube bandage draining yellow serous fluid, red warm skin around feeding tube insertion site. No current chemo or radiation therapy. Wears 2L/min O2 had home.

## 2017-08-26 NOTE — ED Notes (Signed)
MD Ralene Bathe aware of sepsis workup and is at bedside.  Pt refused rectal temp.

## 2017-08-26 NOTE — Discharge Instructions (Signed)
You have fluid on your lungs that will need to be rechecked by your family doctor. Don't be afraid to take your pain medicine according to label instructions. Get rechecked immediately if you develop any new or concerning symptoms.

## 2017-08-26 NOTE — ED Notes (Signed)
Bed: WA06 Expected date:  Expected time:  Means of arrival:  Comments: TR 4 possible sepsis

## 2017-08-26 NOTE — ED Notes (Signed)
Pt ambulated to bathroom w/minor assistance.  Steady gait.

## 2017-08-29 ENCOUNTER — Other Ambulatory Visit (HOSPITAL_COMMUNITY): Payer: Self-pay | Admitting: Radiology

## 2017-08-29 ENCOUNTER — Ambulatory Visit (HOSPITAL_COMMUNITY)
Admission: RE | Admit: 2017-08-29 | Discharge: 2017-08-29 | Disposition: A | Discharge status: Home / Self Care | Payer: Medicare Other | Source: Ambulatory Visit | Attending: Radiology | Admitting: Radiology

## 2017-08-29 ENCOUNTER — Ambulatory Visit (INDEPENDENT_AMBULATORY_CARE_PROVIDER_SITE_OTHER): Payer: Medicare Other | Admitting: Pulmonary Disease

## 2017-08-29 ENCOUNTER — Ambulatory Visit
Admission: RE | Admit: 2017-08-29 | Discharge: 2017-08-29 | Disposition: A | Discharge status: Home / Self Care | Payer: Medicare Other | Source: Ambulatory Visit | Attending: Pulmonary Disease | Admitting: Pulmonary Disease

## 2017-08-29 ENCOUNTER — Encounter: Payer: Self-pay | Admitting: Pulmonary Disease

## 2017-08-29 VITALS — BP 100/68 | HR 80 | Ht 61.0 in | Wt 81.0 lb

## 2017-08-29 DIAGNOSIS — R633 Feeding difficulties, unspecified: Secondary | ICD-10-CM

## 2017-08-29 DIAGNOSIS — J449 Chronic obstructive pulmonary disease, unspecified: Secondary | ICD-10-CM

## 2017-08-29 MED ORDER — FUROSEMIDE 40 MG PO TABS
40.0000 mg | ORAL_TABLET | Freq: Every day | ORAL | 0 refills | Status: DC
Start: 2017-08-29 — End: 2017-10-16

## 2017-08-29 MED ORDER — LEVOFLOXACIN 500 MG PO TABS: 500.0000 mg | ORAL_TABLET | Freq: Every day | ORAL | 0 refills | Status: DC

## 2017-08-29 NOTE — Addendum Note (Signed)
Addended byMarshell Garfinkel on: 08/29/2017 09:24 PM   Modules accepted: Level of Service

## 2017-08-29 NOTE — Progress Notes (Addendum)
MODEST DRAEGER    [id number]    05-24-1941  Primary Care Physician:Meyers, Annie Main, MD  Referring Physician: Orpah Melter, MD [address] [place], [place] [address]  Chief complaint:  Acute visit for dyspnea, cough  HPI: Mrs. Lannan is a 76 year old with past medical history of COPD, squamous cell esophageal cancer with stricture. She underwent EGD and stent placement for stricture on 8/21 by Dr. Michail Sermon. She had to be admitted overnight due to complaints of chest pain. Post discharge she continued to have chest pain, dyspnea. She was seen in the ED on 8/28 with a CT angiogram which showed the stent in good position. She has severe emphysema, bilateral pleural effusions and new rounded density in the right middle lobe. She was not given any specific treatment in the ED and was told to follow-up with primary care and pulmonary.  Today in the office she complains of increasing dyspnea, cough with green mucus, chest discomfort with coughing or sneezing. She denies any fevers, chills.  Outpatient Encounter Prescriptions as of 08/29/2017  Medication Sig  . albuterol (PROAIR HFA) 108 (90 Base) MCG/ACT inhaler 2 puffs every 6 hours as needed for shortness of breath  . HYDROcodone-acetaminophen (HYCET) 7.5-325 mg/15 ml solution Take 15 mLs by mouth every 6 (six) hours as needed for moderate pain or severe pain.   Marland Kitchen ketorolac (TORADOL) 10 MG tablet Take 1 tablet (10 mg total) by mouth every 6 (six) hours as needed. (Patient taking differently: Take 10 mg by mouth every 6 (six) hours as needed for moderate pain or severe pain. )  . lansoprazole (PREVACID SOLUTAB) 30 MG disintegrating tablet Take 30 mg by mouth daily.  . Nutritional Supplements (FEEDING SUPPLEMENT, OSMOLITE 1.5 CAL,) LIQD Place 237 mLs into feeding tube 5 (five) times daily. (Patient taking differently: Place 237 mLs into feeding tube 2 (two) times daily. Patient has decreased her tube feeding to twice a day  because of nausea she is taking in 464 ml of Osmolite daily 237 ml  With 360 cc water at 0800 and 2000 237 ml with 360cc water)  . ondansetron (ZOFRAN-ODT) 8 MG disintegrating tablet Take 8 mg by mouth every 8 (eight) hours as needed for nausea or vomiting.   Marland Kitchen rOPINIRole (REQUIP) 0.25 MG tablet Take 0.25 mg by mouth at bedtime. 1-2 hours before bedtime  . Tiotropium Bromide-Olodaterol (STIOLTO RESPIMAT) 2.5-2.5 MCG/ACT AERS Inhale 2 puffs into the lungs daily.  Marland Kitchen trolamine salicylate (ASPERCREME) 10 % cream Apply 1 application topically as needed for muscle pain.   No facility-administered encounter medications on file as of 08/29/2017.     Allergies as of 08/29/2017 - Review Complete 08/29/2017  Allergen Reaction Noted  . Amoxicillin Nausea And Vomiting 02/09/2017  . Benadryl [diphenhydramine hcl] Other (See Comments) 12/26/2016  . Codeine Nausea And Vomiting 04/24/2016    Past Medical History:  Diagnosis Date  . Breast cancer ([place])    left  . COPD (chronic obstructive pulmonary disease) ([place])   . Dyspnea    with exertion   . Esophageal stricture   . GERD (gastroesophageal reflux disease)   . Pneumonia    hx of   . 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 BALLOON DILATION N/A 05/05/2017   Procedure: BALLOON DILATION;  Surgeon: Wilford Corner, MD;  [facility];  Service: Endoscopy;  Laterality: N/A;  . BREAST LUMPECTOMY Left 1/132006  .  ESOPHAGEAL STENT PLACEMENT N/A 08/19/2017   Procedure: ESOPHAGEAL STENT PLACEMENT;  Surgeon: Clarene Essex, MD;  [facility];  Service: Endoscopy;  Laterality: N/A;  . ESOPHAGOGASTRODUODENOSCOPY (EGD) WITH PROPOFOL N/A 10/24/2016   Procedure: ESOPHAGOGASTRODUODENOSCOPY (EGD) WITH PROPOFOL;  Surgeon: Wilford Corner, MD;  [facility] END[facility];  Service: Endoscopy;  Laterality: N/A;  . ESOPHAGOGASTRODUODENOSCOPY (EGD) WITH PROPOFOL N/A 05/05/2017   Procedure:  ESOPHAGOGASTRODUODENOSCOPY (EGD) WITH PROPOFOL;  Surgeon: Wilford Corner, MD;  [facility];  Service: Endoscopy;  Laterality: N/A;  . ESOPHAGOGASTRODUODENOSCOPY (EGD) WITH PROPOFOL N/A 06/12/2017   Procedure: ESOPHAGOGASTRODUODENOSCOPY (EGD) WITH PROPOFOL;  Surgeon: Wilford Corner, MD;  Location: [facility];  Service: Endoscopy;  Laterality: N/A;  . ESOPHAGOGASTRODUODENOSCOPY (EGD) WITH PROPOFOL N/A 08/19/2017   Procedure: ESOPHAGOGASTRODUODENOSCOPY (EGD) WITH PROPOFOL;  Surgeon: Clarene Essex, MD;  [facility];  Service: Endoscopy;  Laterality: N/A;  with stent placement- metal, removable, covered- 10-12 18-23.  . IR CM INJ ANY COLONIC TUBE W/FLUORO  05/14/2017  . IR GENERIC HISTORICAL  12/26/2016   IR GASTROSTOMY TUBE MOD SED 12/26/2016 Jacqulynn Cadet, MD [facility]  . IR GENERIC HISTORICAL  01/17/2017   IR PATIENT EVAL TECH 0-60 MINS [facility]  . SAVORY DILATION N/A 06/12/2017   Procedure: SAVORY DILATION with Fluoro;  Surgeon: Wilford Corner, MD;  Location: [facility] [facility];  Service: Endoscopy;  Laterality: N/A;    Family History  Problem Relation Age of Onset  . Heart attack Mother   . Heart attack Father   . Arrhythmia Sister   . Cancer Sister        Breast Cancer  . COPD Brother   . Cancer Sister        Skin Cancer, Breast Cancer    Social History   Social History  . Marital status: Married    Spouse name: Nadara Mustard  . Number of children: N/A  . Years of education: N/A   Occupational History  . Not on file.   Social History Main Topics  . Smoking status: Former Smoker    Packs/day: 0.50    Years: 54.00    Types: Cigarettes    Quit date: 01/30/2017  . Smokeless tobacco: Never Used  . Alcohol use No  . Drug use: No  . Sexual activity: Not on file   Other Topics Concern  . Not on file   Social History Narrative   Married, husband Nadara Mustard (who sees Dr. Burr Medico)   Live in [place]    Review of systems: Review of Systems  Constitutional:  Negative for fever and chills.  HENT: Negative.   Eyes: Negative for blurred vision.  Respiratory: as per HPI  Cardiovascular: Negative for chest pain and palpitations.  Gastrointestinal: Negative for vomiting, diarrhea, blood per rectum. Genitourinary: Negative for dysuria, urgency, frequency and hematuria.  Musculoskeletal: Negative for myalgias, back pain and joint pain.  Skin: Negative for itching and rash.  Neurological: Negative for dizziness, tremors, focal weakness, seizures and loss of consciousness.  Endo/Heme/Allergies: Negative for environmental allergies.  Psychiatric/Behavioral: Negative for depression, suicidal ideas and hallucinations.  All other systems reviewed and are negative.  Physical Exam: Blood pressure 100/68, pulse 80, height 5\' 1"  (1.549 m), weight 81 lb (36.7 kg), SpO2 96 %. Gen:      No acute distress HEENT:  EOMI, sclera anicteric Neck:     No masses; no thyromegaly Lungs:    Clear to auscultation bilaterally; normal respiratory effort CV:         Regular rate and rhythm; no murmurs Abd:      +  bowel sounds; soft, non-tender; no palpable masses, no distension Ext:    No edema; adequate peripheral perfusion Skin:      Warm and dry; no rash Neuro: alert and oriented x 3 Psych: normal mood and affect  Data Reviewed: CBC Recent Labs     08/26/17  1605  WBC  7.8  HGB  12.8  HCT  38.0  PLT  215    Coag's Recent Labs     08/26/17  1605  INR  0.86    BMET Recent Labs     08/26/17  1605  NA  132*  K  4.3  CL  95*  CO2  29  BUN  14  CREATININE  0.48  GLUCOSE  113*    Electrolytes Recent Labs     08/26/17  1605  CALCIUM  8.6*    Liver Enzymes Recent Labs     08/26/17  1605  AST  20  ALT  14  ALKPHOS  77  BILITOT  0.5  ALBUMIN  3.3*   Spirometry 04/04/16 FVC 1.41 [7%) FEV1 0.6 (32%) F/F 43 Very severe obstruction  Imaging CT scan 08/26/17 Esophageal stent in place with no migration, severe emphysema. Moderate bilateral  pleural effusion. No pulmonary embolism, Rounded density in the right middle lobe. I have reviewed all images personally.  Assessment:  Acute visit for cough, dyspnea Severe COPD History of esophageal cancer, stricture status post recent stenting I had reviewed her CT scan which does not show any evidence of stent migration of perforation. She does have bilateral effusions and consolidative mass vs fluid in the right middle lobe.  Review of her records show that her esophageal cancer is apparently cured and the stricture was thought to be benign. This RML mass may be just fluid or consolidation from pneumonia. I don't think this is related to the stent due to location of the lesion away from the esophagus.   We will treat her with Levaquin and give her Lasix to diurese the bilateral effusions. She is not wheezing the office today and will not need steroids. She'll need close follow-up with a repeat imaging in 2-4 weeks. We'll also get a metabolic panel at the time of visit to monitor renal function. She has declined a CXR today  I have advised her to go to the emergency room if there is any worsening of symptoms.  Plan/Recommendations: - Levaquin 500 mg/day for 7 days - Lasix 40 mg qd - Follow up in 2-4 weeks  Marshell Garfinkel MD [facility] Pager [phone] 08/29/2017, 1:39 PM  CC: Orpah Melter, MD

## 2017-08-29 NOTE — Patient Instructions (Signed)
We will add Lasix 40 mg a day for the pleural effusion We will get a chest x-ray today Start Levaquin 500 mg a day for the possibility of bronchitis, pneumonia  Follow-up in 2-4 weeks with repeat chest x-ray and metabolic panel If there is any worsening of symptoms please go to the emergency room

## 2017-08-31 LAB — CULTURE, BLOOD (ROUTINE X 2)
CULTURE: NO GROWTH
Culture: NO GROWTH
SPECIAL REQUESTS: ADEQUATE
Special Requests: ADEQUATE

## 2017-09-16 MED FILL — predniSONE 5 MG (21) TBPK: 5 | 6 days supply | Qty: 21 | Fill #0

## 2017-09-18 ENCOUNTER — Other Ambulatory Visit: Payer: Self-pay | Admitting: *Deleted

## 2017-09-18 ENCOUNTER — Telehealth: Payer: Self-pay | Admitting: Oncology

## 2017-09-18 ENCOUNTER — Ambulatory Visit (HOSPITAL_BASED_OUTPATIENT_CLINIC_OR_DEPARTMENT_OTHER): Payer: Medicare Other | Admitting: Oncology

## 2017-09-18 ENCOUNTER — Telehealth: Payer: Self-pay | Admitting: *Deleted

## 2017-09-18 VITALS — BP 141/68 | HR 91 | Temp 98.2°F | Resp 18 | Ht 61.0 in | Wt 75.0 lb

## 2017-09-18 DIAGNOSIS — C154 Malignant neoplasm of middle third of esophagus: Secondary | ICD-10-CM

## 2017-09-18 DIAGNOSIS — C50011 Malignant neoplasm of nipple and areola, right female breast: Secondary | ICD-10-CM

## 2017-09-18 DIAGNOSIS — Z86000 Personal history of in-situ neoplasm of breast: Secondary | ICD-10-CM

## 2017-09-18 MED ORDER — MIRTAZAPINE 15 MG PO TABS: 15.0000 mg | ORAL_TABLET | Freq: Every day | ORAL | 0 refills | Status: AC

## 2017-09-18 MED FILL — MIRTAZAPINE 15 MG TAB: 15 | 30 days supply | Qty: 30 | Fill #0

## 2017-09-18 NOTE — Telephone Encounter (Signed)
Message from [facility]: Pt's husband came to pick up script. 1630: Returned call, [facility] pharmacy was able to transfer script to [facility]. Husband picked up script.

## 2017-09-18 NOTE — Progress Notes (Signed)
  [facility] OFFICE PROGRESS NOTE   Diagnosis: Esophagus cancer  INTERVAL HISTORY:   [name] Osborn returns as scheduled. She underwent placement of an esophagus stent on 08/19/2017. She can now tolerate a diet. However she has a poor appetite and early satiety. She has intermittent nausea. The nausea was worse when she was placed on antibiotics for a respiratory infection.  She continues to have dyspnea. She is followed by pulmonary medicine. She has neck and back pain. She has noted improvement in the neck pain with prednisone.  Objective:  Vital signs in last 24 hours:  Blood pressure (!) 141/68, pulse 91, temperature 98.2 F (36.8 C), temperature source Oral, resp. rate 18, height 5\' 1"  (1.549 m), weight 75 lb (34 kg), SpO2 92 %.    HEENT: Neck without mass Lymphatics: No cervical, supraclavicular, or axillary nodes Resp: Distant breath sounds, no respiratory distress Cardio: Regular rate and rhythm GI: No hepatosplenomegaly, left abdominal feeding tube site without evidence of infection Vascular: No leg edema    Medications: I have reviewed the patient's current medications.  Assessment/Plan: 1.Squamous cell carcinoma the midesophagus  Staging CT scans 10/29/2016 with no evidence of metastatic disease; nonspecific liver lesions, right hydronephrosis, retroperitoneal lymph nodes, and sclerotic right seventh rib lesion  Staging PET scan 11/13/2016-low left jugular/supraclavicular node measuring 5 mm and SUV max of 2.6; left paratracheal node measuring8 mm and SUV max 5.8; periaorticnodes including a 6 mm node SUV max 4.4; right retrocrural hypermetabolic node measures 8 mm and SUV max of 9.4;hypermetabolism corresponding to the mid esophageal primary SUV max 15.6; no right hepatic lobe hypermetabolism; no hypermetabolism to correspond to the retroperitoneal mild adenopathy on prior exam.  Initiation of radiation 11/25/2016 and concurrent weekly  Taxol/carboplatin 11/26/2016; week 5 Taxol/carboplatin 12/31/2016; radiation completed 01/03/2017  Esophagram03/07/2017-smooth narrowing of the thoracic esophagus  Upper endoscopy 05/05/2017-benign appearing esophageal stenosis, dilated. Acute gastritis.  Upper endoscopy 06/12/2017-benign appearing esophageal stenosis. Dilated. Acute gastritis.  Esophagus stent placement 08/19/2017  2. COPD  3. 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 Right hydronephrosis on the CT 10/29/2016  5. Dysphagia/weight loss secondary to #1  Placement of an esophagus stent 08/19/2017  6. Rash upper back 12/10/2016. Question etiology.  7. Feeding tube placement 12/26/2016  8. Diarrhea, failure to thrive 02/12/2017. C. difficile negative 02/18/2017. Improved 02/20/2017.  9. Acute mid back pain-04/21/2017; x-ray thoracic spine with minimal upper thoracic vertebral body compression fracture.  10. Herpes zoster left upper lateral back. 7 day course of Valtrex prescribed.    Disposition:  She remains in clinical remission from esophagus cancer. I doubt the neck and back pain are related to esophagus cancer. She will continue follow-up with orthopedics. I encouraged her to increase oral intake as tolerated.  Karen Osborn feels depressed. She requested an antidepressant today. We prescribed Remeron. She will contact us if this has not helped within a few weeks. She will obtain an influenza vaccine via her primary physician.  Karen Osborn will return for an office visit in 2 months.  15 minutes were spent with the patient today. The majority of the time was used for counseling and coordination of care.  Donneta Romberg, MD  09/18/2017  9:25 AM

## 2017-09-18 NOTE — Telephone Encounter (Signed)
Gave avs and calendar for november °

## 2017-09-25 ENCOUNTER — Ambulatory Visit (INDEPENDENT_AMBULATORY_CARE_PROVIDER_SITE_OTHER)
Admission: RE | Admit: 2017-09-25 | Discharge: 2017-09-25 | Disposition: A | Discharge status: Home / Self Care | Payer: Medicare Other | Source: Ambulatory Visit | Attending: Adult Health | Admitting: Adult Health

## 2017-09-25 ENCOUNTER — Other Ambulatory Visit (INDEPENDENT_AMBULATORY_CARE_PROVIDER_SITE_OTHER): Payer: Medicare Other

## 2017-09-25 ENCOUNTER — Encounter: Payer: Self-pay | Admitting: Adult Health

## 2017-09-25 ENCOUNTER — Ambulatory Visit (INDEPENDENT_AMBULATORY_CARE_PROVIDER_SITE_OTHER): Payer: Medicare Other | Admitting: Adult Health

## 2017-09-25 VITALS — HR 92 | Ht 61.0 in | Wt 72.2 lb

## 2017-09-25 DIAGNOSIS — J441 Chronic obstructive pulmonary disease with (acute) exacerbation: Secondary | ICD-10-CM

## 2017-09-25 DIAGNOSIS — J189 Pneumonia, unspecified organism: Secondary | ICD-10-CM

## 2017-09-25 DIAGNOSIS — J690 Pneumonitis due to inhalation of food and vomit: Secondary | ICD-10-CM | POA: Diagnosis not present

## 2017-09-25 DIAGNOSIS — J181 Lobar pneumonia, unspecified organism: Secondary | ICD-10-CM

## 2017-09-25 DIAGNOSIS — J90 Pleural effusion, not elsewhere classified: Secondary | ICD-10-CM

## 2017-09-25 LAB — BASIC METABOLIC PANEL
BUN: 22 mg/dL (ref 6–23)
CO2: 37 mEq/L — ABNORMAL HIGH (ref 19–32)
Calcium: 8.9 mg/dL (ref 8.4–10.5)
Chloride: 87 mEq/L — ABNORMAL LOW (ref 96–112)
Creatinine, Ser: 0.65 mg/dL (ref 0.40–1.20)
GFR: 94.74 mL/min (ref >60.00)
GLUCOSE: 82 mg/dL (ref 70–99)
Potassium: 3.3 mEq/L — ABNORMAL LOW (ref 3.5–5.1)
SODIUM: 129 meq/L — AB (ref 135–145)

## 2017-09-25 LAB — CBC WITH DIFFERENTIAL/PLATELET
BASOS ABS: 0 10*3/uL (ref 0.0–0.1)
Basophils Relative: 0.4 % (ref 0.0–3.0)
EOS ABS: 0.2 10*3/uL (ref 0.0–0.7)
EOS PCT: 2.2 % (ref 0.0–5.0)
HCT: 39.4 % (ref 36.0–46.0)
Hemoglobin: 13 g/dL (ref 12.0–15.0)
Lymphocytes Relative: 5.8 % — ABNORMAL LOW (ref 12.0–46.0)
Lymphs Abs: 0.5 10*3/uL — ABNORMAL LOW (ref 0.7–4.0)
MCHC: 33.1 g/dL (ref 30.0–36.0)
MCV: 92.1 fl (ref 78.0–100.0)
MONO ABS: 1 10*3/uL (ref 0.1–1.0)
Monocytes Relative: 11.1 % (ref 3.0–12.0)
NEUTROS PCT: 80.5 % — AB (ref 43.0–77.0)
Neutro Abs: 7.1 10*3/uL (ref 1.4–7.7)
Platelets: 225 10*3/uL (ref 150.0–400.0)
RBC: 4.27 Mil/uL (ref 3.87–5.11)
RDW: 12.8 % (ref 11.5–15.5)
WBC: 8.8 10*3/uL (ref 4.0–10.5)

## 2017-09-25 LAB — BRAIN NATRIURETIC PEPTIDE: Pro B Natriuretic peptide (BNP): 87 pg/mL (ref 0.0–100.0)

## 2017-09-25 MED ORDER — AZITHROMYCIN 250 MG PO TABS: 250.0000 mg | ORAL_TABLET | Freq: Every day | ORAL | 0 refills | Status: DC

## 2017-09-25 MED ORDER — LEVALBUTEROL HCL 0.63 MG/3ML IN NEBU
0.6300 mg | INHALATION_SOLUTION | Freq: Once | RESPIRATORY_TRACT | Status: AC
  Administered 2017-09-25: 0.63 mg via RESPIRATORY_TRACT

## 2017-09-25 MED ORDER — PREDNISONE 10 MG PO TABS: ORAL_TABLET | ORAL | 0 refills | Status: DC

## 2017-09-25 NOTE — Assessment & Plan Note (Signed)
?  Right sided PNA /Lung density  abx x 1 week  Check cxr in 2 weeks .

## 2017-09-25 NOTE — Assessment & Plan Note (Signed)
Flare with possible Right sided PNA -R lung mass ? Loculated fluid . Unable to tolerate full course of levaquin 4 weeks ago.  Will tx w/ azithromycin at low dose (wt is ~72lbs ) .  Small steroid challenge for 1 week .  follow up in 2-3 weeks with cxr , if no better, consider repeat CT chest.  She has hx of esophageal cancer.  xopenex neb given   Plan  Patient Instructions  Begin Azithromycin 250mg  daily for 7 days .  Prednisone 20mg  daily for 3 days then 10mg  daily for 3 days and stop .  Mucinex DM Twice daily  As needed  Cough .  Labs today .  Follow up with Dr. Vaughan Browner in 2-3 weeks and As needed   Please contact office for sooner follow up if symptoms do not improve or worsen or seek emergency care

## 2017-09-25 NOTE — Patient Instructions (Signed)
Begin Azithromycin 250mg  daily for 7 days .  Prednisone 20mg  daily for 3 days then 10mg  daily for 3 days and stop .  Mucinex DM Twice daily  As needed  Cough .  Labs today .  Follow up with Dr. Vaughan Browner in 2-3 weeks and As needed   Please contact office for sooner follow up if symptoms do not improve or worsen or seek emergency care

## 2017-09-25 NOTE — Progress Notes (Signed)
@Patient  ID: Karen Osborn, female    DOB: 1941-01-19, 76 y.o.   MRN: [id number]  Chief Complaint  Patient presents with  . Follow-up    PNA     Referring provider: Orpah Melter, MD  HPI: 76 year old female former smoker quit 01/2017 , followed for gold 3 COPD and O2 RF -2l/m  Esophageal Cancer  09/2016 s/p Chemo/XRT , feeding tube History of breast cancer 2006, status post lumpectomy and radiation.  TEST  PFT's 11/21/15 FEV1 0.83 (42 % ) ratio 47 p 18 % improvement from saba  - Spirometry 04/24/16 FEV1 0.60 (32%) Ratio 43   09/25/2017 Follow up : PNA  Patient returns for a one-month follow-up. Patient was seen last visit for suspected pneumonia. Patient has a history of squamous cell esophageal cancer with stricture. She underwent a EGD and stent placement for stricture on 08/19/2017 by GI. Unfortunately, patient went to the ER on August 28 with chest pain. A CT chest showed stent was in good position. It noted is severe emphysema, bilateral pleural effusion and a new rounded density along the right middle lobe. She had accompanying cough, congestion with thick green mucus. Patient was started on antibiotics with Levaquin.Marland Kitchen She was also given Lasix 40 mg daily.  Since last visit. Patient is not feeling any better. Continues to have cough, low energy, thick mucus. Now has wheezing . She was not able to finish Levaquin . Took 5 out 7 days.  Chest x-ray today shows Appetite remains very low. Eats small amounts.  Still uses Feeding tube with osmolite Three times a day      Allergies  Allergen Reactions  . Amoxicillin Nausea And Vomiting    Has patient had a PCN reaction causing immediate rash, facial/tongue/throat swelling, SOB or lightheadedness with hypotension: No Has patient had a PCN reaction causing severe rash involving mucus membranes or skin necrosis: No Has patient had a PCN reaction that required hospitalization No Has patient had a PCN reaction occurring  within the last 10 years: Yes If all of the above answers are "NO", then may proceed with Cephalosporin use.   . Benadryl [Diphenhydramine Hcl] Other (See Comments)    Jittery, patient states "legs bouncing up and down"   . Codeine Nausea And Vomiting    Immunization History  Administered Date(s) Administered  . Influenza, High Dose Seasonal PF 09/29/2016  . Pneumococcal Conjugate-13 12/30/2014    Past Medical History:  Diagnosis Date  . Breast cancer ([place])    left  . COPD (chronic obstructive pulmonary disease) ([place])   . Dyspnea    with exertion   . Esophageal stricture   . GERD (gastroesophageal reflux disease)   . Pneumonia    hx of   . Primary esophageal squamous cell carcinoma ([facility])     Tobacco History: History  Smoking Status  . Former Smoker  . Packs/day: 0.50  . Years: 54.00  . Types: Cigarettes  . Quit date: 01/30/2017  Smokeless Tobacco  . Never Used   Counseling given: Not Answered   Outpatient Encounter Prescriptions as of 09/25/2017  Medication Sig  . albuterol (PROAIR HFA) 108 (90 Base) MCG/ACT inhaler 2 puffs every 6 hours as needed for shortness of breath  . furosemide (LASIX) 40 MG tablet Take 1 tablet (40 mg total) by mouth daily.  . lansoprazole (PREVACID SOLUTAB) 30 MG disintegrating tablet Take 30 mg by mouth daily.  . mirtazapine (REMERON) 15 MG tablet Take 1 tablet (15 mg total) by mouth at bedtime.  Marland Kitchen  Nutritional Supplements (FEEDING SUPPLEMENT, OSMOLITE 1.5 CAL,) LIQD Place 237 mLs into feeding tube 5 (five) times daily. (Patient taking differently: Place 237 mLs into feeding tube 2 (two) times daily. Patient has decreased her tube feeding to twice a day because of nausea she is taking in 464 ml of Osmolite daily 237 ml  With 360 cc water at 0800 and 2000 237 ml with 360cc water)  . ondansetron (ZOFRAN-ODT) 8 MG disintegrating tablet Take 8 mg by mouth every 8 (eight) hours as needed for nausea or vomiting.   Marland Kitchen rOPINIRole (REQUIP) 0.25 MG  tablet Take 0.25 mg by mouth at bedtime. 1-2 hours before bedtime  . Tiotropium Bromide-Olodaterol (STIOLTO RESPIMAT) 2.5-2.5 MCG/ACT AERS Inhale 2 puffs into the lungs daily.  Marland Kitchen trolamine salicylate (ASPERCREME) 10 % cream Apply 1 application topically as needed for muscle pain.  Marland Kitchen azithromycin (ZITHROMAX) 250 MG tablet Take 1 tablet (250 mg total) by mouth daily.  . predniSONE (DELTASONE) 10 MG tablet 2 tabs for 3 days, then 1 tab for 3 days, then stop  . [DISCONTINUED] levofloxacin (LEVAQUIN) 500 MG tablet Take 1 tablet (500 mg total) by mouth daily. (Patient not taking: Reported on 09/25/2017)  . [DISCONTINUED] predniSONE (STERAPRED UNI-PAK 21 TAB) 5 MG (21) TBPK tablet Taper for back pain   No facility-administered encounter medications on file as of 09/25/2017.      Review of Systems  Constitutional:   No  weight loss, night sweats,  Fevers, chills, fatigue, or  lassitude.  HEENT:   No headaches,  Difficulty swallowing,  Tooth/dental problems, or  Sore throat,                No sneezing, itching, ear ache, nasal congestion, post nasal drip,   CV:  No chest pain,  Orthopnea, PND, swelling in lower extremities, anasarca, dizziness, palpitations, syncope.   GI  No heartburn, indigestion, abdominal pain, nausea, vomiting, diarrhea, change in bowel habits, loss of appetite, bloody stools.   Resp: No shortness of breath with exertion or at rest.  No excess mucus, no productive cough,  No non-productive cough,  No coughing up of blood.  No change in color of mucus.  No wheezing.  No chest wall deformity  Skin: no rash or lesions.  GU: no dysuria, change in color of urine, no urgency or frequency.  No flank pain, no hematuria   MS:  No joint pain or swelling.  No decreased range of motion.  No back pain.    Physical Exam  Pulse 92   Ht 5\' 1"  (1.549 m)   Wt 72 lb 3.2 oz (32.7 kg)   SpO2 95%   BMI 13.64 kg/m   GEN: A/Ox3; pleasant , NAD, well nourished    HEENT:  [place]/AT,   EACs-clear, TMs-wnl, NOSE-clear, THROAT-clear, no lesions, no postnasal drip or exudate noted.   NECK:  Supple w/ fair ROM; no JVD; normal carotid impulses w/o bruits; no thyromegaly or nodules palpated; no lymphadenopathy.    RESP  Clear  P & A; w/o, wheezes/ rales/ or rhonchi. no accessory muscle use, no dullness to percussion  CARD:  RRR, no m/r/g, no peripheral edema, pulses intact, no cyanosis or clubbing.  GI:   Soft & nt; nml bowel sounds; no organomegaly or masses detected.   Musco: Warm bil, no deformities or joint swelling noted.   Neuro: alert, no focal deficits noted.    Skin: Warm, no lesions or rashes    Lab Results:  CBC  Component Value Date/Time   WBC 7.8 08/26/2017 1605   RBC 4.22 08/26/2017 1605   HGB 12.8 08/26/2017 1605   HGB 13.1 01/14/2017 0816   HCT 38.0 08/26/2017 1605   HCT 39.6 01/14/2017 0816   PLT 215 08/26/2017 1605   PLT 218 01/14/2017 0816   MCV 90.0 08/26/2017 1605   MCV 94.7 01/14/2017 0816   MCH 30.3 08/26/2017 1605   MCHC 33.7 08/26/2017 1605   RDW 12.4 08/26/2017 1605   RDW 16.2 (H) 01/14/2017 0816   LYMPHSABS 0.5 (L) 08/26/2017 1605   LYMPHSABS 0.1 (L) 01/14/2017 0816   MONOABS 0.5 08/26/2017 1605   MONOABS 0.5 01/14/2017 0816   EOSABS 0.1 08/26/2017 1605   EOSABS 0.1 01/14/2017 0816   BASOSABS 0.0 08/26/2017 1605   BASOSABS 0.0 01/14/2017 0816    BMET    Component Value Date/Time   NA 132 (L) 08/26/2017 1605   NA 131 (L) 02/12/2017 1330   K 4.3 08/26/2017 1605   K 3.4 (L) 02/12/2017 1330   CL 95 (L) 08/26/2017 1605   CO2 29 08/26/2017 1605   CO2 31 (H) 02/12/2017 1330   GLUCOSE 113 (H) 08/26/2017 1605   GLUCOSE 130 02/12/2017 1330   BUN 14 08/26/2017 1605   BUN 14.7 02/12/2017 1330   CREATININE 0.48 08/26/2017 1605   CREATININE 0.5 (L) 02/12/2017 1330   CALCIUM 8.6 (L) 08/26/2017 1605   CALCIUM 8.7 02/12/2017 1330   GFRNONAA >60 08/26/2017 1605   GFRAA >60 08/26/2017 1605    BNP No results found for:  BNP  ProBNP No results found for: PROBNP  Imaging: Dg Chest 2 View  Result Date: 09/25/2017 CLINICAL DATA:  Cough, congestion, former smoking history EXAM: CHEST  2 VIEW COMPARISON:  CT the chest of 08/26/2017 and chest x-ray of the same day FINDINGS: The lungs are markedly hyperaerated consistent with emphysema. No pneumonia is seen. However there do appear to be small pleural effusions blunting the costophrenic angles. A rounded mass is noted medially at the right lung base. Compared to the prior CT, this area may represent loculated fluid with low-attenuation by CT, but continued followup is recommended to exclude an enlarging mass. Mediastinal and hilar contours are unremarkable. An esophageal stent is noted in the mid distal portion of the esophagus. The heart is within normal limits in size. Minimally compressed T6 vertebral body appears stable. IMPRESSION: 1. Severe emphysematous change. 2. Small pleural effusions. 3. Rounded mass medially at the right lung base may represent loculated fluid but recommend attention to this area on follow-up imaging. 4. Esophageal stent present. Electronically Signed   By: Ivar Drape M.D.   On: 09/25/2017 14:50   Ct Angio Chest Pe W/cm &/or Wo Cm  Result Date: 08/26/2017 CLINICAL DATA:  Of breath, productive cough. History of breast cancer. EXAM: CT ANGIOGRAPHY CHEST WITH CONTRAST TECHNIQUE: Multidetector CT imaging of the chest was performed using the standard protocol during bolus administration of intravenous contrast. Multiplanar CT image reconstructions and MIPs were obtained to evaluate the vascular anatomy. CONTRAST:  70 cc Isovue 370 IV COMPARISON:  02/03/2017 FINDINGS: Cardiovascular: Heart is upper limits normal in size. Moderate atherosclerotic calcifications throughout the aorta and coronary arteries. No evidence of aortic aneurysm. No evidence of pulmonary embolus. Mediastinum/Nodes: Esophageal stent in place. No mediastinal, hilar, or axillary  adenopathy. Lungs/Pleura: Moderate to large bilateral pleural effusions. Advanced emphysema. Rounded low-density masslike area in the anterior right middle lobe at the cardiac border measures up to 2.5 cm, new since  prior CT. Compressive atelectasis in the lower lobes. Upper Abdomen: Imaging into the upper abdomen shows no acute findings. Musculoskeletal: Chest wall soft tissues are unremarkable. No acute bony abnormality. Review of the MIP images confirms the above findings. IMPRESSION: Esophageal stent within the mid to distal esophagus. Severe emphysema. Moderate to large bilateral pleural effusions. Area of rounded low density in the medial right middle lobe measuring 2.5 cm, new since prior study. This appears fluid density and may reflect an area of loculated pleural fluid. This could be followed in 3-6 months after acute symptoms resolve to assess for change. Aortic Atherosclerosis (ICD10-I70.0). Electronically Signed   By: Rolm Baptise M.D.   On: 08/26/2017 18:18     Assessment & Plan:   COPD exacerbation ([place]) Flare with possible Right sided PNA -R lung mass ? Loculated fluid . Unable to tolerate full course of levaquin 4 weeks ago.  Will tx w/ azithromycin at low dose (wt is ~72lbs ) .  Small steroid challenge for 1 week .  follow up in 2-3 weeks with cxr , if no better, consider repeat CT chest.  She has hx of esophageal cancer.  xopenex neb given   Plan  Patient Instructions  Begin Azithromycin 250mg  daily for 7 days .  Prednisone 20mg  daily for 3 days then 10mg  daily for 3 days and stop .  Mucinex DM Twice daily  As needed  Cough .  Labs today .  Follow up with Dr. Vaughan Browner in 2-3 weeks and As needed   Please contact office for sooner follow up if symptoms do not improve or worsen or seek emergency care      Pneumonia ?Right sided PNA /Lung density  abx x 1 week  Check cxr in 2 weeks .       Rexene Edison, NP 09/25/2017

## 2017-09-25 NOTE — Addendum Note (Signed)
Addended by: Parke Poisson E on: 09/25/2017 05:04 PM   Modules accepted: Orders

## 2017-09-26 ENCOUNTER — Telehealth: Payer: Self-pay | Admitting: Adult Health

## 2017-09-26 MED ORDER — POTASSIUM CHLORIDE ER 10 MEQ PO TBCR: 10.0000 meq | EXTENDED_RELEASE_TABLET | Freq: Every day | ORAL | 1 refills | Status: AC

## 2017-09-26 NOTE — Telephone Encounter (Signed)
Notes recorded by Melvenia Needles, NP on 09/26/2017 at 2:59 PM EDT Seen in office , tx w/ abx Will have close follow up in 2 weeks with cxr , if not better will need CT chest  Pt aware        IMPRESSION: 1. Severe emphysematous change. 2. Small pleural effusions. 3. Rounded mass medially at the right lung base may represent loculated fluid but recommend attention to this area on follow-up imaging. 4. Esophageal stent present.  Advised daughter of results and sent in Rx for potassium. Nothing further is needed.

## 2017-10-09 ENCOUNTER — Telehealth: Payer: Self-pay | Admitting: Pulmonary Disease

## 2017-10-09 ENCOUNTER — Ambulatory Visit (INDEPENDENT_AMBULATORY_CARE_PROVIDER_SITE_OTHER): Payer: Medicare Other | Admitting: Pulmonary Disease

## 2017-10-09 ENCOUNTER — Encounter: Payer: Self-pay | Admitting: Pulmonary Disease

## 2017-10-09 VITALS — BP 132/78 | HR 109 | Ht 61.0 in | Wt 73.0 lb

## 2017-10-09 DIAGNOSIS — R05 Cough: Secondary | ICD-10-CM | POA: Diagnosis not present

## 2017-10-09 DIAGNOSIS — R911 Solitary pulmonary nodule: Secondary | ICD-10-CM | POA: Diagnosis not present

## 2017-10-09 DIAGNOSIS — J449 Chronic obstructive pulmonary disease, unspecified: Secondary | ICD-10-CM | POA: Diagnosis not present

## 2017-10-09 DIAGNOSIS — J690 Pneumonitis due to inhalation of food and vomit: Secondary | ICD-10-CM

## 2017-10-09 DIAGNOSIS — C154 Malignant neoplasm of middle third of esophagus: Secondary | ICD-10-CM

## 2017-10-09 DIAGNOSIS — G43A1 Cyclical vomiting, intractable: Secondary | ICD-10-CM

## 2017-10-09 DIAGNOSIS — R1115 Cyclical vomiting syndrome unrelated to migraine: Secondary | ICD-10-CM

## 2017-10-09 DIAGNOSIS — R059 Cough, unspecified: Secondary | ICD-10-CM

## 2017-10-09 MED ORDER — ALBUTEROL SULFATE (2.5 MG/3ML) 0.083% IN NEBU: 2.5000 mg | INHALATION_SOLUTION | Freq: Two times a day (BID) | RESPIRATORY_TRACT | 12 refills | Status: DC | PRN

## 2017-10-09 MED ORDER — ONDANSETRON 8 MG PO TBDP: 8.0000 mg | ORAL_TABLET | Freq: Three times a day (TID) | ORAL | 0 refills | Status: AC | PRN

## 2017-10-09 MED ORDER — CEFDINIR 300 MG PO CAPS: 300.0000 mg | ORAL_CAPSULE | Freq: Two times a day (BID) | ORAL | 0 refills | Status: DC

## 2017-10-09 MED FILL — CEFDINIR 300 MG CAPSULE: 300 | 10 days supply | Qty: 20 | Fill #0

## 2017-10-09 MED FILL — ALBUTEROL 0.083% INHAL SOLN: (2.5 MG/3ML | 15 days supply | Qty: 90 | Fill #0

## 2017-10-09 MED FILL — ONDANSETRON ODT 8 MG TABLET: 8 | 6 days supply | Qty: 20 | Fill #0

## 2017-10-09 NOTE — Telephone Encounter (Signed)
Spoke with [facility] to advise it was ok to dispense a 30 count since they did not have a 25 count box on the nebulizer medication. Nothing further is needed.

## 2017-10-09 NOTE — Assessment & Plan Note (Signed)
In one month Schedule CT chest without contrast His would be three-month follow-up of pulmonary nodule 2.5 cm noted on her CT in 07/2017

## 2017-10-09 NOTE — Patient Instructions (Addendum)
Omnicef 300 mg twice daily for 10 days Take Mucinex 600 mg daily  Check sputum culture  Refill on Zofran - all medications to be sent to [facility]  Prescription for nebulizer 1 Albuterol nebs twice daily as needed for shortness of breath or wheezing  In one month Schedule CT chest without contrast

## 2017-10-09 NOTE — Progress Notes (Signed)
   Subjective:    Patient ID: Karen Osborn, female    DOB: Jul 15, 1941, 76 y.o.   MRN: [id number]  HPI  76 year old female former smoker quit 01/2017 , followed for gold 3 COPD and O2 RF -2l/m  Esophageal Cancer  09/2016 s/p Chemo/XRT , feeding tube History of breast cancer 2006, status post lumpectomy and radiation.  She underwent a EGD and stent placement for stricture on 08/19/2017 by GI Michail Sermon) CT chest 8/28 after an ER visit showed severe emphysema, bilateral pleural effusions and a new rounded density along the right middle lobe She was given antibiotics then and again on office visit 8/31 -Levaquin and then again Z-Pak on an office visit 9/27  She continues to feel poorly, breathing is worse, she reports increased shortness of breath, cough productive of occasional clear and otherwise yellow sputum. She also reports periodic night sweats and a feeling of warmth although no fevers  She has been unable to gain weight, mostly uses PEG tube but occasionally drinks liquids-do not see a formal swallow evaluation  Significant tests/ events reviewed  PFT's 11/21/15 FEV1 0.83 (42 % ) ratio 47 p 18 % improvement from saba  - Spirometry 04/24/16 FEV1 0.60 (32%) Ratio 43    Past Medical History:  Diagnosis Date  . Breast cancer ([place])    left  . COPD (chronic obstructive pulmonary disease) ([place])   . Dyspnea    with exertion   . Esophageal stricture   . GERD (gastroesophageal reflux disease)   . Pneumonia    hx of   . Primary esophageal squamous cell carcinoma ([facility])      Review of Systems neg for any significant sore throat, dysphagia, itching, sneezing, nasal congestion or excess/ purulent secretions, fever, chills, sweats, unintended wt loss, pleuritic or exertional cp, hempoptysis, orthopnea pnd or change in chronic leg swelling.   Also denies presyncope, palpitations, heartburn, abdominal pain, nausea, vomiting, diarrhea or change in bowel or urinary habits,  dysuria,hematuria, rash, arthralgias, visual complaints, headache, numbness weakness or ataxia.     Objective:   Physical Exam  Gen. Pleasant, thin, cachectic,, in no distress, anxious affect, bundled up ENT - no lesions, no post nasal drip Neck: No JVD, no thyromegaly, no carotid bruits Lungs: no use of accessory muscles, no dullness to percussion, decreased bilateral without rales or rhonchi  Cardiovascular: Rhythm regular,tachycardia, heart sounds  normal, no murmurs or gallops, no peripheral edema Abdomen: soft and non-tender, no hepatosplenomegaly, BS normal. Musculoskeletal: No deformities, no cyanosis or clubbing Neuro:  alert, non focal       Assessment & Plan:

## 2017-10-09 NOTE — Assessment & Plan Note (Signed)
Refill on Zofran

## 2017-10-09 NOTE — Assessment & Plan Note (Signed)
Omnicef 300 mg twice daily for 10 days Take Mucinex 600 mg daily  Check sputum culture  Wonder if her symptoms are related to recurrent aspiration, we will proceed with formal swallowing evaluation

## 2017-10-09 NOTE — Assessment & Plan Note (Signed)
   Prescription for nebulizer 1 Albuterol nebs twice daily as needed for shortness of breath or wheezing

## 2017-10-11 ENCOUNTER — Emergency Department (HOSPITAL_COMMUNITY): Payer: Medicare Other

## 2017-10-11 ENCOUNTER — Inpatient Hospital Stay (HOSPITAL_COMMUNITY)
Admission: EM | Admit: 2017-10-11 | Discharge: 2017-10-16 | DRG: 385 | Disposition: A | Discharge status: Hospice | Payer: Medicare Other | Attending: Internal Medicine | Admitting: Internal Medicine

## 2017-10-11 ENCOUNTER — Encounter (HOSPITAL_COMMUNITY): Payer: Self-pay | Admitting: Emergency Medicine

## 2017-10-11 ENCOUNTER — Telehealth: Payer: Self-pay | Admitting: Internal Medicine

## 2017-10-11 DIAGNOSIS — J441 Chronic obstructive pulmonary disease with (acute) exacerbation: Secondary | ICD-10-CM | POA: Diagnosis present

## 2017-10-11 DIAGNOSIS — C7801 Secondary malignant neoplasm of right lung: Secondary | ICD-10-CM | POA: Diagnosis present

## 2017-10-11 DIAGNOSIS — Z87891 Personal history of nicotine dependence: Secondary | ICD-10-CM | POA: Diagnosis not present

## 2017-10-11 DIAGNOSIS — I959 Hypotension, unspecified: Secondary | ICD-10-CM | POA: Diagnosis present

## 2017-10-11 DIAGNOSIS — K219 Gastro-esophageal reflux disease without esophagitis: Secondary | ICD-10-CM | POA: Diagnosis present

## 2017-10-11 DIAGNOSIS — Z515 Encounter for palliative care: Secondary | ICD-10-CM | POA: Diagnosis not present

## 2017-10-11 DIAGNOSIS — I340 Nonrheumatic mitral (valve) insufficiency: Secondary | ICD-10-CM | POA: Diagnosis not present

## 2017-10-11 DIAGNOSIS — K51511 Left sided colitis with rectal bleeding: Secondary | ICD-10-CM | POA: Diagnosis present

## 2017-10-11 DIAGNOSIS — Z853 Personal history of malignant neoplasm of breast: Secondary | ICD-10-CM

## 2017-10-11 DIAGNOSIS — I313 Pericardial effusion (noninflammatory): Secondary | ICD-10-CM | POA: Diagnosis present

## 2017-10-11 DIAGNOSIS — J440 Chronic obstructive pulmonary disease with acute lower respiratory infection: Secondary | ICD-10-CM | POA: Diagnosis not present

## 2017-10-11 DIAGNOSIS — E43 Unspecified severe protein-calorie malnutrition: Secondary | ICD-10-CM | POA: Diagnosis present

## 2017-10-11 DIAGNOSIS — J910 Malignant pleural effusion: Secondary | ICD-10-CM | POA: Diagnosis present

## 2017-10-11 DIAGNOSIS — Z803 Family history of malignant neoplasm of breast: Secondary | ICD-10-CM

## 2017-10-11 DIAGNOSIS — J189 Pneumonia, unspecified organism: Secondary | ICD-10-CM | POA: Diagnosis not present

## 2017-10-11 DIAGNOSIS — R0600 Dyspnea, unspecified: Secondary | ICD-10-CM | POA: Diagnosis not present

## 2017-10-11 DIAGNOSIS — R0902 Hypoxemia: Secondary | ICD-10-CM | POA: Diagnosis not present

## 2017-10-11 DIAGNOSIS — D62 Acute posthemorrhagic anemia: Secondary | ICD-10-CM | POA: Diagnosis present

## 2017-10-11 DIAGNOSIS — K529 Noninfective gastroenteritis and colitis, unspecified: Secondary | ICD-10-CM | POA: Diagnosis present

## 2017-10-11 DIAGNOSIS — Z923 Personal history of irradiation: Secondary | ICD-10-CM

## 2017-10-11 DIAGNOSIS — C154 Malignant neoplasm of middle third of esophagus: Secondary | ICD-10-CM | POA: Diagnosis present

## 2017-10-11 DIAGNOSIS — R197 Diarrhea, unspecified: Secondary | ICD-10-CM

## 2017-10-11 DIAGNOSIS — Z808 Family history of malignant neoplasm of other organs or systems: Secondary | ICD-10-CM | POA: Diagnosis not present

## 2017-10-11 DIAGNOSIS — Z681 Body mass index (BMI) 19 or less, adult: Secondary | ICD-10-CM

## 2017-10-11 DIAGNOSIS — Z66 Do not resuscitate: Secondary | ICD-10-CM | POA: Diagnosis present

## 2017-10-11 DIAGNOSIS — Z9889 Other specified postprocedural states: Secondary | ICD-10-CM

## 2017-10-11 DIAGNOSIS — Z931 Gastrostomy status: Secondary | ICD-10-CM

## 2017-10-11 DIAGNOSIS — B029 Zoster without complications: Secondary | ICD-10-CM | POA: Diagnosis present

## 2017-10-11 DIAGNOSIS — J9621 Acute and chronic respiratory failure with hypoxia: Secondary | ICD-10-CM | POA: Diagnosis not present

## 2017-10-11 DIAGNOSIS — J90 Pleural effusion, not elsewhere classified: Secondary | ICD-10-CM | POA: Diagnosis not present

## 2017-10-11 DIAGNOSIS — C787 Secondary malignant neoplasm of liver and intrahepatic bile duct: Secondary | ICD-10-CM | POA: Diagnosis present

## 2017-10-11 DIAGNOSIS — C7889 Secondary malignant neoplasm of other digestive organs: Secondary | ICD-10-CM | POA: Diagnosis not present

## 2017-10-11 DIAGNOSIS — K7689 Other specified diseases of liver: Secondary | ICD-10-CM | POA: Diagnosis not present

## 2017-10-11 DIAGNOSIS — D649 Anemia, unspecified: Secondary | ICD-10-CM | POA: Diagnosis not present

## 2017-10-11 DIAGNOSIS — E871 Hypo-osmolality and hyponatremia: Secondary | ICD-10-CM | POA: Diagnosis not present

## 2017-10-11 DIAGNOSIS — R634 Abnormal weight loss: Secondary | ICD-10-CM | POA: Diagnosis not present

## 2017-10-11 DIAGNOSIS — K5793 Diverticulitis of intestine, part unspecified, without perforation or abscess with bleeding: Secondary | ICD-10-CM | POA: Diagnosis not present

## 2017-10-11 DIAGNOSIS — I361 Nonrheumatic tricuspid (valve) insufficiency: Secondary | ICD-10-CM | POA: Diagnosis not present

## 2017-10-11 DIAGNOSIS — Z825 Family history of asthma and other chronic lower respiratory diseases: Secondary | ICD-10-CM

## 2017-10-11 DIAGNOSIS — R1310 Dysphagia, unspecified: Secondary | ICD-10-CM | POA: Diagnosis not present

## 2017-10-11 DIAGNOSIS — R590 Localized enlarged lymph nodes: Secondary | ICD-10-CM | POA: Diagnosis present

## 2017-10-11 DIAGNOSIS — J449 Chronic obstructive pulmonary disease, unspecified: Secondary | ICD-10-CM | POA: Diagnosis not present

## 2017-10-11 DIAGNOSIS — Z8249 Family history of ischemic heart disease and other diseases of the circulatory system: Secondary | ICD-10-CM

## 2017-10-11 LAB — CBC
HCT: 40.6 % (ref 36.0–46.0)
Hemoglobin: 13.4 g/dL (ref 12.0–15.0)
MCH: 31.5 pg (ref 26.0–34.0)
MCHC: 33 g/dL (ref 30.0–36.0)
MCV: 95.5 fL (ref 78.0–100.0)
Platelets: 293 10*3/uL (ref 150–400)
RBC: 4.25 MIL/uL (ref 3.87–5.11)
RDW: 13 % (ref 11.5–15.5)
WBC: 6.8 10*3/uL (ref 4.0–10.5)

## 2017-10-11 LAB — COMPREHENSIVE METABOLIC PANEL
ALT: 17 U/L (ref 14–54)
AST: 21 U/L (ref 15–41)
Albumin: 3.3 g/dL — ABNORMAL LOW (ref 3.5–5.0)
Alkaline Phosphatase: 116 U/L (ref 38–126)
Anion gap: 11 (ref 5–15)
BUN: 15 mg/dL (ref 6–20)
CO2: 30 mmol/L (ref 22–32)
Calcium: 9.1 mg/dL (ref 8.9–10.3)
Chloride: 96 mmol/L — ABNORMAL LOW (ref 101–111)
Creatinine, Ser: 0.51 mg/dL (ref 0.44–1.00)
GFR calc Af Amer: >60 mL/min (ref >60)
GFR calc non Af Amer: >60 mL/min (ref >60)
Glucose, Bld: 93 mg/dL (ref 65–99)
Potassium: 4.7 mmol/L (ref 3.5–5.1)
Sodium: 137 mmol/L (ref 135–145)
Total Bilirubin: <0.1 mg/dL — ABNORMAL LOW (ref 0.3–1.2)
Total Protein: 6.5 g/dL (ref 6.5–8.1)

## 2017-10-11 LAB — LIPASE, BLOOD: LIPASE: 34 U/L (ref 11–51)

## 2017-10-11 LAB — URINALYSIS, ROUTINE W REFLEX MICROSCOPIC
Bilirubin Urine: NEGATIVE
GLUCOSE, UA: 50 mg/dL — AB
HGB URINE DIPSTICK: NEGATIVE
KETONES UR: 5 mg/dL — AB
LEUKOCYTES UA: NEGATIVE
Nitrite: NEGATIVE
PROTEIN: NEGATIVE mg/dL
Specific Gravity, Urine: 1.019 (ref 1.005–1.030)
pH: 6 (ref 5.0–8.0)

## 2017-10-11 LAB — POC OCCULT BLOOD, ED: Fecal Occult Bld: NEGATIVE

## 2017-10-11 LAB — I-STAT CHEM 8, ED
BUN: 13 mg/dL (ref 6–20)
CALCIUM ION: 1.11 mmol/L — AB (ref 1.15–1.40)
Chloride: 97 mmol/L — ABNORMAL LOW (ref 101–111)
Creatinine, Ser: 0.5 mg/dL (ref 0.44–1.00)
GLUCOSE: 93 mg/dL (ref 65–99)
HCT: 34 % — ABNORMAL LOW (ref 36.0–46.0)
HEMOGLOBIN: 11.6 g/dL — AB (ref 12.0–15.0)
Potassium: 4.4 mmol/L (ref 3.5–5.1)
SODIUM: 134 mmol/L — AB (ref 135–145)
TCO2: 30 mmol/L (ref 22–32)

## 2017-10-11 LAB — I-STAT TROPONIN, ED: TROPONIN I, POC: 0 ng/mL (ref 0.00–0.08)

## 2017-10-11 LAB — I-STAT CG4 LACTIC ACID, ED: LACTIC ACID, VENOUS: 0.54 mmol/L (ref 0.5–1.9)

## 2017-10-11 LAB — TYPE AND SCREEN
ABO/RH(D): B POS
Antibody Screen: NEGATIVE

## 2017-10-11 MED ORDER — IOPAMIDOL (ISOVUE-300) INJECTION 61%
30.0000 mL | Freq: Once | INTRAVENOUS | Status: AC | PRN
  Administered 2017-10-11: 30 mL via ORAL

## 2017-10-11 MED ORDER — IOPAMIDOL (ISOVUE-300) INJECTION 61%
100.0000 mL | Freq: Once | INTRAVENOUS | Status: AC | PRN
  Administered 2017-10-11: 80 mL via INTRAVENOUS

## 2017-10-11 MED ORDER — METRONIDAZOLE IN NACL 5-0.79 MG/ML-% IV SOLN
500.0000 mg | Freq: Once | INTRAVENOUS | Status: AC
  Administered 2017-10-11: 500 mg via INTRAVENOUS
  Filled 2017-10-11: qty 100

## 2017-10-11 MED ORDER — CIPROFLOXACIN IN D5W 400 MG/200ML IV SOLN
400.0000 mg | Freq: Once | INTRAVENOUS | Status: AC
  Administered 2017-10-11: 400 mg via INTRAVENOUS
  Filled 2017-10-11: qty 200

## 2017-10-11 MED ORDER — SODIUM CHLORIDE 0.9 % IV BOLUS (SEPSIS)
500.0000 mL | Freq: Once | INTRAVENOUS | Status: AC
  Administered 2017-10-11: 500 mL via INTRAVENOUS

## 2017-10-11 MED ORDER — IOPAMIDOL (ISOVUE-300) INJECTION 61%
INTRAVENOUS | Status: AC
  Administered 2017-10-11: 21:00:00
  Filled 2017-10-11: qty 100

## 2017-10-11 MED ORDER — IOPAMIDOL (ISOVUE-300) INJECTION 61%
INTRAVENOUS | Status: AC
  Administered 2017-10-11: 19:00:00
  Filled 2017-10-11: qty 30

## 2017-10-11 MED ORDER — ACETAMINOPHEN 325 MG PO TABS
650.0000 mg | ORAL_TABLET | Freq: Once | ORAL | Status: AC
  Administered 2017-10-11: 650 mg via ORAL
  Filled 2017-10-11: qty 2

## 2017-10-11 NOTE — ED Notes (Signed)
Hemoccult is negative. 

## 2017-10-11 NOTE — Telephone Encounter (Signed)
Reports bleeding from rectum and wants to know if from [facility] Not on anticoagulants Total amt so far this am sev tbsp brb   Imp ? Hem bleeding but hard to say, clearly not from abx > rec contact her GI doctor Michail Sermon) this am

## 2017-10-11 NOTE — ED Notes (Signed)
Delay in EKG due to artifact.

## 2017-10-11 NOTE — H&P (Signed)
TRH H&P   Patient Demographics:    Karen Osborn, is a 76 y.o. female  MRN: [id number]   DOB - 1941/12/28  Admit Date - 10/11/2017  Outpatient Primary MD for the patient is Orpah Melter, MD  Referring MD/NP/PA:  Waynetta Pean  Outpatient Specialists:    Baltazar Najjar   Patient coming from: home  Chief Complaint  Patient presents with  . Blood In Stools      HPI:    Karen Osborn  is a 76 y.o. female, w metastatic esophageal carcinoma, Copd apparently c/o rectal bleeding since Thursday. Blood on toilet paper and in toilet bowel.   Was worse today (2 episodes at home and 1 in ER),  And some diarrhea for the past 2 days and  therefore presented to ED for evaluation.    In ED,  Hgb 11.6, Plt 293 Na 134 Bp 88/62  Pt was tx with Ns iv and type and screen and CT scan abd/ pelvis=> IMPRESSION: 1. Mild wall thickening and surrounding edema of sigmoid colon and rectum compatible with acute colitis. No perforation or abscess. 2. Increased size of mass within the medial aspect of right middle lobe with heterogeneous enhancement suspicious for metastasis. 3. Moderate right and small left pleural effusions. 4. New mild L4 vertebral body compression deformity from 08/19/2017.  Pt will be admitted for colitis.     Review of systems:    In addition to the HPI above,  No Fever-chills, No Headache, No changes with Vision or hearing, No problems swallowing food or Liquids, No Chest pain, Slight  Cough (white-yellow), and slight Shortness of Breath, No Abdominal pain, No Blood in stool or Urine, No dysuria, No new skin rashes or bruises, No new joints pains-aches,  No new weakness, tingling, numbness in any extremity, No recent weight gain or loss, No polyuria, polydypsia or polyphagia, No significant Mental Stressors.  A full 10 point Review of  Systems was done, except as stated above, all other Review of Systems were negative.   With Past History of the following :    Past Medical History:  Diagnosis Date  . Breast cancer ([place])    left  . COPD (chronic obstructive pulmonary disease) ([place])   . Dyspnea    with exertion   . Esophageal stricture   . GERD (gastroesophageal reflux disease)   . Pneumonia    hx of   . 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 BALLOON DILATION N/A 05/05/2017   Procedure: BALLOON DILATION;  Surgeon: Wilford Corner, MD;  Location: [facility];  Service: Endoscopy;  Laterality: N/A;  . BREAST LUMPECTOMY Left 1/132006  . ESOPHAGEAL STENT PLACEMENT N/A 08/19/2017   Procedure: ESOPHAGEAL STENT PLACEMENT;  Surgeon: Clarene Essex, MD;  Location: [facility];  Service: Endoscopy;  Laterality: N/A;  .  ESOPHAGOGASTRODUODENOSCOPY (EGD) WITH PROPOFOL N/A 10/24/2016   Procedure: ESOPHAGOGASTRODUODENOSCOPY (EGD) WITH PROPOFOL;  Surgeon: Wilford Corner, MD;  [facility];  Service: Endoscopy;  Laterality: N/A;  . ESOPHAGOGASTRODUODENOSCOPY (EGD) WITH PROPOFOL N/A 05/05/2017   Procedure: ESOPHAGOGASTRODUODENOSCOPY (EGD) WITH PROPOFOL;  Surgeon: Wilford Corner, MD;  Location: [facility];  Service: Endoscopy;  Laterality: N/A;  . ESOPHAGOGASTRODUODENOSCOPY (EGD) WITH PROPOFOL N/A 06/12/2017   Procedure: ESOPHAGOGASTRODUODENOSCOPY (EGD) WITH PROPOFOL;  Surgeon: Wilford Corner, MD;  Location: [place];  Service: Endoscopy;  Laterality: N/A;  . ESOPHAGOGASTRODUODENOSCOPY (EGD) WITH PROPOFOL N/A 08/19/2017   Procedure: ESOPHAGOGASTRODUODENOSCOPY (EGD) WITH PROPOFOL;  Surgeon: Clarene Essex, MD;  Location: [facility];  Service: Endoscopy;  Laterality: N/A;  with stent placement- metal, removable, covered- 10-12 18-23.  . IR CM INJ ANY COLONIC TUBE W/FLUORO  05/14/2017  . IR GENERIC HISTORICAL  12/26/2016   IR GASTROSTOMY TUBE MOD SED  12/26/2016 Jacqulynn Cadet, MD [facility]  . IR GENERIC HISTORICAL  01/17/2017   IR PATIENT EVAL TECH 0-60 MINS [facility]  . SAVORY DILATION N/A 06/12/2017   Procedure: SAVORY DILATION with Fluoro;  Surgeon: Wilford Corner, MD;  Location: [place];  Service: Endoscopy;  Laterality: N/A;      Social History:     Social History  Substance Use Topics  . Smoking status: Former Smoker    Packs/day: 0.50    Years: 54.00    Types: Cigarettes    Quit date: 01/30/2017  . Smokeless tobacco: Never Used  . Alcohol use No     Lives - at home  Mobility - walks w assistance   Family History :     Family History  Problem Relation Age of Onset  . Heart attack Mother   . Heart attack Father   . Arrhythmia Sister   . Cancer Sister        Breast Cancer  . COPD Brother   . Cancer Sister        Skin Cancer, Breast Cancer     Home Medications:   Prior to Admission medications   Medication Sig Start Date End Date Taking? Authorizing Provider  acetaminophen (TYLENOL) 160 MG/5ML liquid Take 325 mg by mouth every 4 (four) hours as needed for pain.   Yes [provider]  albuterol (PROAIR HFA) 108 (90 Base) MCG/ACT inhaler 2 puffs every 6 hours as needed for shortness of breath 06/09/17  Yes Parrett, Tammy S, NP  albuterol (PROVENTIL) (2.5 MG/3ML) 0.083% nebulizer solution Take 3 mLs (2.5 mg total) by nebulization 2 (two) times daily as needed for wheezing or shortness of breath. 10/09/17  Yes Rigoberto Noel, MD  cefdinir (OMNICEF) 300 MG capsule Take 1 capsule (300 mg total) by mouth 2 (two) times daily. 10/09/17  Yes Rigoberto Noel, MD  guaiFENesin-dextromethorphan (ROBITUSSIN DM) 100-10 MG/5ML syrup Take 10 mLs by mouth every 4 (four) hours as needed for cough.   Yes [provider]  lansoprazole (PREVACID SOLUTAB) 30 MG disintegrating tablet Take 30 mg by mouth daily.   Yes [provider]  mirtazapine (REMERON) 15 MG tablet Take 1 tablet (15 mg total)  by mouth at bedtime. 09/18/17  Yes Ladell Pier, MD  Nutritional Supplements (FEEDING SUPPLEMENT, OSMOLITE 1.5 CAL,) LIQD Place 237 mLs into feeding tube 5 (five) times daily. Patient taking differently: Place 237 mLs into feeding tube 2 (two) times daily. Patient has decreased her tube feeding to twice a day because of nausea she is taking in 464 ml of Osmolite daily 237  ml  With 360 cc water at 0800 and 2000 237 ml with 360cc water 02/05/17  Yes Mikhail, Maryann, DO  ondansetron (ZOFRAN-ODT) 8 MG disintegrating tablet Take 1 tablet (8 mg total) by mouth every 8 (eight) hours as needed for nausea or vomiting. 10/09/17  Yes Rigoberto Noel, MD  potassium chloride (K-DUR) 10 MEQ tablet Take 1 tablet (10 mEq total) by mouth daily. 09/26/17  Yes Parrett, Tammy S, NP  rOPINIRole (REQUIP) 0.25 MG tablet Take 0.25 mg by mouth at bedtime. 1-2 hours before bedtime   Yes [provider]  Tiotropium Bromide-Olodaterol (STIOLTO RESPIMAT) 2.5-2.5 MCG/ACT AERS Inhale 2 puffs into the lungs daily. 06/09/17  Yes Parrett, Tammy S, NP  trolamine salicylate (ASPERCREME) 10 % cream Apply 1 application topically as needed for muscle pain.   Yes [provider]  furosemide (LASIX) 40 MG tablet Take 1 tablet (40 mg total) by mouth daily. Patient not taking: Reported on 10/11/2017 08/29/17   Marshell Garfinkel, MD     Allergies:     Allergies  Allergen Reactions  . Amoxicillin Nausea And Vomiting    Has patient had a PCN reaction causing immediate rash, facial/tongue/throat swelling, SOB or lightheadedness with hypotension: No Has patient had a PCN reaction causing severe rash involving mucus membranes or skin necrosis: No Has patient had a PCN reaction that required hospitalization No Has patient had a PCN reaction occurring within the last 10 years: Yes If all of the above answers are "NO", then may proceed with Cephalosporin use.   . Benadryl [Diphenhydramine Hcl] Other (See Comments)    Jittery,  patient states "legs bouncing up and down"   . Codeine Nausea And Vomiting     Physical Exam:   Vitals  Blood pressure 109/67, pulse 91, temperature 98.6 F (37 C), temperature source Oral, resp. rate (!) 25, SpO2 94 %.   1. General lying in bed in NAD,    2. Normal affect and insight, Not Suicidal or Homicidal, Awake Alert, Oriented X 3.  3. No F.N deficits, ALL C.Nerves Intact, Strength 5/5 all 4 extremities, Sensation intact all 4 extremities, Plantars down going.  4. Ears and Eyes appear Normal, Conjunctivae clear, PERRLA. Moist Oral Mucosa.  5. Supple Neck, No JVD, No cervical lymphadenopathy appriciated, No Carotid Bruits.  6. Symmetrical Chest wall movement, Good air movement bilaterally, CTAB.  7. RRR, No Gallops, Rubs or Murmurs, No Parasternal Heave.  8. Positive Bowel Sounds, Abdomen Soft, No tenderness, No organomegaly appriciated,No rebound -guarding or rigidity.  9.  No Cyanosis, Normal Skin Turgor, No Skin Rash or Bruise.  10. Good muscle tone,  joints appear normal , no effusions, Normal ROM.  11. No Palpable Lymph Nodes in Neck or Axillae    Data Review:    CBC  Recent Labs Lab 10/11/17 1625 10/11/17 1859  WBC 6.8  --   HGB 13.4 11.6*  HCT 40.6 34.0*  PLT 293  --   MCV 95.5  --   MCH 31.5  --   MCHC 33.0  --   RDW 13.0  --    ------------------------------------------------------------------------------------------------------------------  Chemistries   Recent Labs Lab 10/11/17 1625 10/11/17 1859  NA 137 134*  K 4.7 4.4  CL 96* 97*  CO2 30  --   GLUCOSE 93 93  BUN 15 13  CREATININE 0.51 0.50  CALCIUM 9.1  --   AST 21  --   ALT 17  --   ALKPHOS 116  --   BILITOT <0.1*  --    ------------------------------------------------------------------------------------------------------------------  estimated creatinine clearance is 32.7 mL/min (by C-G formula based on SCr of 0.5  mg/dL). ------------------------------------------------------------------------------------------------------------------ No results for input(s): TSH, T4TOTAL, T3FREE, THYROIDAB in the last 72 hours.  Invalid input(s): FREET3  Coagulation profile No results for input(s): INR, PROTIME in the last 168 hours. ------------------------------------------------------------------------------------------------------------------- No results for input(s): DDIMER in the last 72 hours. -------------------------------------------------------------------------------------------------------------------  Cardiac Enzymes No results for input(s): CKMB, TROPONINI, MYOGLOBIN in the last 168 hours.  Invalid input(s): CK ------------------------------------------------------------------------------------------------------------------ No results found for: BNP   ---------------------------------------------------------------------------------------------------------------  Urinalysis    Component Value Date/Time   COLORURINE YELLOW 10/11/2017 1636   APPEARANCEUR CLOUDY (A) 10/11/2017 1636   LABSPEC 1.019 10/11/2017 1636   PHURINE 6.0 10/11/2017 1636   GLUCOSEU 50 (A) 10/11/2017 1636   HGBUR NEGATIVE 10/11/2017 1636   BILIRUBINUR NEGATIVE 10/11/2017 1636   KETONESUR 5 (A) 10/11/2017 1636   PROTEINUR NEGATIVE 10/11/2017 1636   NITRITE NEGATIVE 10/11/2017 1636   LEUKOCYTESUR NEGATIVE 10/11/2017 1636    ----------------------------------------------------------------------------------------------------------------   Imaging Results:    Dg Chest 2 View  Result Date: 10/11/2017 CLINICAL DATA:  76 year old female with diarrhea and bloody stools. Cough. Esophageal stent. Breast cancer. Former smoker. EXAM: CHEST  2 VIEW COMPARISON:  Chest radiographs 09/25/2017 and earlier. FINDINGS: Semi upright AP and lateral views of the chest. Stable mid and lower thoracic esophageal stent. Increased small  bilateral pleural effusions since September greater on the right. No superimposed pneumothorax or pulmonary edema. Widespread emphysema. Stable cardiac size and mediastinal contours. Osteopenia. Stable visualized osseous structures. Paucity of bowel gas in the upper abdomen. IMPRESSION: 1. Increasing small bilateral pleural effusions since September. 2. No new cardiopulmonary abnormality. 3. Stable esophageal stent.  Emphysema (ICD10-J43.9). Electronically Signed   By: Genevie Ann M.D.   On: 10/11/2017 19:20   Ct Abdomen Pelvis W Contrast  Result Date: 10/11/2017 CLINICAL DATA:  76 y/o  F; abdominal pain.  Blood in stool. EXAM: CT ABDOMEN AND PELVIS WITH CONTRAST TECHNIQUE: Multidetector CT imaging of the abdomen and pelvis was performed using the standard protocol following bolus administration of intravenous contrast. CONTRAST:  28mL ISOVUE-300 IOPAMIDOL (ISOVUE-300) INJECTION 61%, 73mL ISOVUE-300 IOPAMIDOL (ISOVUE-300) INJECTION 61% COMPARISON:  10/29/2016 CT abdomen and pelvis. 08/19/2017 lumbar spine radiographs. FINDINGS: Lower chest: Moderate right and small left pleural effusions. Partially visualized stent within the distal esophagus. Partially visualize heterogeneous lesion within medial aspect of right middle lobe is increased in size measuring up to 4.4 cm (series 2, image 1). Hepatobiliary: Stable low-attenuation lesions within the liver at the segment 6/7 junction (series 2, image 24) with equilibration on the venous phase, probably hemangioma. No additional liver lesion identified. Normal gallbladder. No intra or extrahepatic biliary ductal dilatation. Pancreas: Unremarkable. No pancreatic ductal dilatation or surrounding inflammatory changes. Spleen: Normal in size without focal abnormality. Adrenals/Urinary Tract: Normal adrenal glands. Bilateral kidney lower pole homogeneous fluid attenuating well-circumscribed lesions the largest on the right measuring up to 2.9 cm compatible with a cyst. No  hydronephrosis. Normal bladder peer Stomach/Bowel: Mild wall thickening and surrounding inflammatory changes involving the sigmoid colon and rectum compatible with acute colitis. No findings for perforation or abscess. Small bowel is unremarkable. Percutaneous gastrostomy tube balloon in the body of stomach. Vascular/Lymphatic: Aortic atherosclerosis. No enlarged abdominal or pelvic lymph nodes. Reproductive: Uterus and bilateral adnexa are unremarkable. Other: No abdominal wall hernia or abnormality. No abdominopelvic ascites. Musculoskeletal: New L4 vertebral body superior endplate compression deformity with mild loss of vertebral body height. IMPRESSION: 1. Mild wall thickening and surrounding edema of  sigmoid colon and rectum compatible with acute colitis. No perforation or abscess. 2. Increased size of mass within the medial aspect of right middle lobe with heterogeneous enhancement suspicious for metastasis. 3. Moderate right and small left pleural effusions. 4. New mild L4 vertebral body compression deformity from 08/19/2017. Electronically Signed   By: Kristine Garbe M.D.   On: 10/11/2017 21:29   NSR at 85, nl axis, nl int, poor R progression, no st-t changes c/w ischemia   Assessment & Plan:    Principal Problem:   Colitis Active Problems:   Anemia   Hyponatremia   Hypotension    Colitis Stool studies Gi pathogen panel C. Diff Start Levaquin iv, Flagyl 500mg  iv tid.    Rectal bleeding NPO except medications Please consult GI in am  Anemia Repeat cbc in am  Hypotension Trop I q6h x3 cortisol Check cardiac echo  Hyponatremia Hydrate with ns iv Check cmp in am  Metastatic esophageal carcinoma Will need oncology follow up  Please consult oncology in am   DVT Prophylaxis - SCDs  AM Labs Ordered, also please review Full Orders  Family Communication: Admission, patients condition and plan of care including tests being ordered have been discussed with the  patient  who indicate understanding and agree with the plan and Code Status.  Code Status FULL CODE  Likely DC to  home  Condition GUARDED    Consults called: none  Admission status: inpatient   Time spent in minutes : 45    Jani Gravel M.D on 10/11/2017 at 10:56 PM  Between 7am to 7pm - Pager - [phone] After 7pm go to [email or web address] - password [email or web address]  [facility] - Office  [phone]

## 2017-10-11 NOTE — Progress Notes (Signed)
Call nurse for report at 2250. Call [phone]

## 2017-10-11 NOTE — ED Provider Notes (Signed)
[facility] DEPT Provider Note   CSN: [id number] Arrival date & time: 10/11/17  1210     History   Chief Complaint Chief Complaint  Patient presents with  . Blood In Stools    HPI SUMMAR MCGLOTHLIN is a 76 y.o. female.  FAIGE SEELY is a 76 y.o. Female who presents to the ED complaining of loose stools with bright red blood streaked in the stools for the past 3 days. Patient reports she had a loose stool 3 days ago and when she wiped she had bright red blood on her toilet paper. She reports she had 2 loose stools yesterday and noticed again bright red blood in her toilet. Patient reports she also had 2 loose stools today and there is some red blood in the toilet with her stool. She also reports some LLQ abdominal pain for about a month now and some epigastric abdominal pain intermittently. 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 She reports she was put on cefdinir 3 days ago for a new round density on chest x-ray by pulmonology. She is on oxygen chronically. She reports her breathing is at her baseline. No increased cough or shortness of breath.  She denies melena. She denies fevers, vomiting, melena, worsening breathing, lightheadedness, dizziness, rashes, urinary symptoms.    The history is provided by the patient, medical records, the spouse and a relative. No language interpreter was used.    Past Medical History:  Diagnosis Date  . Breast cancer ([place])    left  . COPD (chronic obstructive pulmonary disease) ([place])   . Dyspnea    with exertion   . Esophageal stricture   . GERD (gastroesophageal reflux disease)   . Pneumonia    hx of   . 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 Colitis 10/11/2017  . Post-op pain 08/19/2017  . Nausea and vomiting 08/19/2017  . Inadequate pain control 08/19/2017  . COPD exacerbation ([place]) 08/19/2017  . Chest pain 08/19/2017  .  Back pain 08/19/2017  . Esophageal carcinoma ([place]) 08/19/2017  . Esophageal stricture 05/05/2017  . Chronic respiratory failure ([place]) 04/09/2017  . Aspiration pneumonia ([place]) 02/03/2017  . Pneumonia 02/03/2017  . 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 Difficulty in swallowing 10/24/2016  . COPD (chronic obstructive pulmonary disease) ([place]) 04/24/2016  . Cigarette smoker 04/24/2016  . Breast cancer ([place]) 02/18/2013  . 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 BALLOON DILATION N/A 05/05/2017   Procedure: BALLOON DILATION;  Surgeon: Wilford Corner, MD;  Location: [facility];  Service: Endoscopy;  Laterality: N/A;  . BREAST LUMPECTOMY Left 1/132006  . ESOPHAGEAL STENT PLACEMENT N/A 08/19/2017   Procedure: ESOPHAGEAL STENT PLACEMENT;  Surgeon: Clarene Essex, MD;  Location: [facility];  Service: Endoscopy;  Laterality: N/A;  . ESOPHAGOGASTRODUODENOSCOPY (EGD) WITH PROPOFOL N/A 10/24/2016   Procedure: ESOPHAGOGASTRODUODENOSCOPY (EGD) WITH PROPOFOL;  Surgeon: Wilford Corner, MD;  Location: [facility] ENDOSCOPY;  Service: Endoscopy;  Laterality: N/A;  . ESOPHAGOGASTRODUODENOSCOPY (EGD) WITH PROPOFOL N/A 05/05/2017   Procedure: ESOPHAGOGASTRODUODENOSCOPY (EGD) WITH PROPOFOL;  Surgeon: Wilford Corner, MD;  Location: [facility];  Service: Endoscopy;  Laterality: N/A;  . ESOPHAGOGASTRODUODENOSCOPY (EGD) WITH PROPOFOL N/A 06/12/2017   Procedure: ESOPHAGOGASTRODUODENOSCOPY (EGD) WITH PROPOFOL;  Surgeon: Wilford Corner, MD;  Location: [facility];  Service: Endoscopy;  Laterality: N/A;  . ESOPHAGOGASTRODUODENOSCOPY (EGD) WITH PROPOFOL N/A  08/19/2017   Procedure: ESOPHAGOGASTRODUODENOSCOPY (EGD) WITH PROPOFOL;  Surgeon: Clarene Essex, MD;  Location: [facility];  Service: Endoscopy;  Laterality: N/A;  with stent placement- metal, removable, covered- 10-12 18-23.  . IR CM INJ ANY COLONIC TUBE W/FLUORO  05/14/2017  . IR GENERIC HISTORICAL   12/26/2016   IR GASTROSTOMY TUBE MOD SED 12/26/2016 Jacqulynn Cadet, MD [facility]  . IR GENERIC HISTORICAL  01/17/2017   IR PATIENT EVAL TECH 0-60 MINS [facility]  . SAVORY DILATION N/A 06/12/2017   Procedure: SAVORY DILATION with Fluoro;  Surgeon: Wilford Corner, MD;  Location: [facility];  Service: Endoscopy;  Laterality: N/A;    OB History    No data available       Home Medications    Prior to Admission medications   Medication Sig Start Date End Date Taking? Authorizing Provider  acetaminophen (TYLENOL) 160 MG/5ML liquid Take 325 mg by mouth every 4 (four) hours as needed for pain.   Yes [provider]  albuterol (PROAIR HFA) 108 (90 Base) MCG/ACT inhaler 2 puffs every 6 hours as needed for shortness of breath 06/09/17  Yes Parrett, Tammy S, NP  albuterol (PROVENTIL) (2.5 MG/3ML) 0.083% nebulizer solution Take 3 mLs (2.5 mg total) by nebulization 2 (two) times daily as needed for wheezing or shortness of breath. 10/09/17  Yes Rigoberto Noel, MD  cefdinir (OMNICEF) 300 MG capsule Take 1 capsule (300 mg total) by mouth 2 (two) times daily. 10/09/17  Yes Rigoberto Noel, MD  guaiFENesin-dextromethorphan (ROBITUSSIN DM) 100-10 MG/5ML syrup Take 10 mLs by mouth every 4 (four) hours as needed for cough.   Yes [provider]  lansoprazole (PREVACID SOLUTAB) 30 MG disintegrating tablet Take 30 mg by mouth daily.   Yes [provider]  mirtazapine (REMERON) 15 MG tablet Take 1 tablet (15 mg total) by mouth at bedtime. 09/18/17  Yes Ladell Pier, MD  Nutritional Supplements (FEEDING SUPPLEMENT, OSMOLITE 1.5 CAL,) LIQD Place 237 mLs into feeding tube 5 (five) times daily. Patient taking differently: Place 237 mLs into feeding tube 2 (two) times daily. Patient has decreased her tube feeding to twice a day because of nausea she is taking in 464 ml of Osmolite daily 237 ml  With 360 cc water at 0800 and 2000 237 ml with 360cc water 02/05/17  Yes Mikhail,  Maryann, DO  ondansetron (ZOFRAN-ODT) 8 MG disintegrating tablet Take 1 tablet (8 mg total) by mouth every 8 (eight) hours as needed for nausea or vomiting. 10/09/17  Yes Rigoberto Noel, MD  potassium chloride (K-DUR) 10 MEQ tablet Take 1 tablet (10 mEq total) by mouth daily. 09/26/17  Yes Parrett, Tammy S, NP  rOPINIRole (REQUIP) 0.25 MG tablet Take 0.25 mg by mouth at bedtime. 1-2 hours before bedtime   Yes [provider]  Tiotropium Bromide-Olodaterol (STIOLTO RESPIMAT) 2.5-2.5 MCG/ACT AERS Inhale 2 puffs into the lungs daily. 06/09/17  Yes Parrett, Tammy S, NP  trolamine salicylate (ASPERCREME) 10 % cream Apply 1 application topically as needed for muscle pain.   Yes [provider]  furosemide (LASIX) 40 MG tablet Take 1 tablet (40 mg total) by mouth daily. Patient not taking: Reported on 10/11/2017 08/29/17   Marshell Garfinkel, MD    Family History Family History  Problem Relation Age of Onset  . Heart attack Mother   . Heart attack Father   . Arrhythmia Sister   . Cancer Sister        Breast Cancer  . COPD Brother   .  Cancer Sister        Skin Cancer, Breast Cancer    Social History Social History  Substance Use Topics  . Smoking status: Former Smoker    Packs/day: 0.50    Years: 54.00    Types: Cigarettes    Quit date: 01/30/2017  . Smokeless tobacco: Never Used  . Alcohol use No     Allergies   Amoxicillin; Benadryl [diphenhydramine hcl]; and Codeine   Review of Systems Review of Systems  Constitutional: Negative for chills and fever.  HENT: Negative for congestion and sore throat.   Eyes: Negative for visual disturbance.  Respiratory: Negative for cough and shortness of breath.   Cardiovascular: Negative for chest pain.  Gastrointestinal: Positive for abdominal pain, blood in stool, diarrhea and nausea. Negative for abdominal distention, rectal pain and vomiting.  Genitourinary: Negative for difficulty urinating and dysuria.  Musculoskeletal:  Negative for back pain and neck pain.  Skin: Negative for rash.  Neurological: Negative for light-headedness and headaches.     Physical Exam Updated Vital Signs BP 109/67 (BP Location: Right Arm)   Pulse 91   Temp 98.6 F (37 C) (Oral)   Resp (!) 25   SpO2 94%   Physical Exam  Constitutional: She appears well-developed and well-nourished. No distress.  Nontoxic appearing. Frail.   HENT:  Head: Normocephalic and atraumatic.  Mouth/Throat: Oropharynx is clear and moist.  Eyes: Pupils are equal, round, and reactive to light. Conjunctivae are normal. Right eye exhibits no discharge. Left eye exhibits no discharge.  Neck: Neck supple.  Cardiovascular: Normal rate, regular rhythm, normal heart sounds and intact distal pulses.  Exam reveals no gallop and no friction rub.   No murmur heard. Pulmonary/Chest: Effort normal and breath sounds normal. No respiratory distress. She has no wheezes. She has no rales.  Diminished to bilateral bases. No increased work of breathing.   Abdominal: Soft. Bowel sounds are normal. She exhibits no distension and no mass. There is tenderness. There is no rebound and no guarding.  Abdomen is soft. Bowel sounds are present. Patient has mild epigastric and left lower quadrant tenderness to palpation. PEG tube in place to her left lower quadrant.  Genitourinary: Rectal exam shows guaiac negative stool.  Genitourinary Comments: Digital rectal exam performed with female RN chaperone. Reddish tinge noted to stool. Hemoccult is negative.  Musculoskeletal: She exhibits no edema.  Lymphadenopathy:    She has no cervical adenopathy.  Neurological: She is alert. Coordination normal.  Skin: Skin is warm and dry. Capillary refill takes less than 2 seconds. No rash noted. She is not diaphoretic. No erythema. No pallor.  Psychiatric: She has a normal mood and affect. Her behavior is normal.  Nursing note and vitals reviewed.    ED Treatments / Results  Labs (all  labs ordered are listed, but only abnormal results are displayed) Labs Reviewed  COMPREHENSIVE METABOLIC PANEL - Abnormal; Notable for the following:       Result Value   Chloride 96 (*)    Albumin 3.3 (*)    Total Bilirubin <0.1 (*)    All other components within normal limits  URINALYSIS, ROUTINE W REFLEX MICROSCOPIC - Abnormal; Notable for the following:    APPearance CLOUDY (*)    Glucose, UA 50 (*)    Ketones, ur 5 (*)    All other components within normal limits  I-STAT CHEM 8, ED - Abnormal; Notable for the following:    Sodium 134 (*)    Chloride 97 (*)  Calcium, Ion 1.11 (*)    Hemoglobin 11.6 (*)    HCT 34.0 (*)    All other components within normal limits  GASTROINTESTINAL PANEL BY PCR, STOOL (REPLACES STOOL CULTURE)  CBC  LIPASE, BLOOD  POC OCCULT BLOOD, ED  I-STAT CG4 LACTIC ACID, ED  I-STAT TROPONIN, ED  TYPE AND SCREEN    EKG  EKG Interpretation  Date/Time:  Saturday October 11 2017 19:44:44 EDT Ventricular Rate:  83 PR Interval:    QRS Duration: 79 QT Interval:  362 QTC Calculation: 426 R Axis:   94 Text Interpretation:  Sinus rhythm Atrial premature complexes Anteroseptal infarct, age indeterminate Artifact When compared with ECG of 08/26/2017 No significant change was found Confirmed by Francine Graven [id number]) on 10/11/2017 8:02:13 PM       Radiology Dg Chest 2 View  Result Date: 10/11/2017 CLINICAL DATA:  76 year old female with diarrhea and bloody stools. Cough. Esophageal stent. Breast cancer. Former smoker. EXAM: CHEST  2 VIEW COMPARISON:  Chest radiographs 09/25/2017 and earlier. FINDINGS: Semi upright AP and lateral views of the chest. Stable mid and lower thoracic esophageal stent. Increased small bilateral pleural effusions since September greater on the right. No superimposed pneumothorax or pulmonary edema. Widespread emphysema. Stable cardiac size and mediastinal contours. Osteopenia. Stable visualized osseous structures. Paucity of  bowel gas in the upper abdomen. IMPRESSION: 1. Increasing small bilateral pleural effusions since September. 2. No new cardiopulmonary abnormality. 3. Stable esophageal stent.  Emphysema (ICD10-J43.9). Electronically Signed   By: Genevie Ann M.D.   On: 10/11/2017 19:20   Ct Abdomen Pelvis W Contrast  Result Date: 10/11/2017 CLINICAL DATA:  76 y/o  F; abdominal pain.  Blood in stool. EXAM: CT ABDOMEN AND PELVIS WITH CONTRAST TECHNIQUE: Multidetector CT imaging of the abdomen and pelvis was performed using the standard protocol following bolus administration of intravenous contrast. CONTRAST:  43mL ISOVUE-300 IOPAMIDOL (ISOVUE-300) INJECTION 61%, 29mL ISOVUE-300 IOPAMIDOL (ISOVUE-300) INJECTION 61% COMPARISON:  10/29/2016 CT abdomen and pelvis. 08/19/2017 lumbar spine radiographs. FINDINGS: Lower chest: Moderate right and small left pleural effusions. Partially visualized stent within the distal esophagus. Partially visualize heterogeneous lesion within medial aspect of right middle lobe is increased in size measuring up to 4.4 cm (series 2, image 1). Hepatobiliary: Stable low-attenuation lesions within the liver at the segment 6/7 junction (series 2, image 24) with equilibration on the venous phase, probably hemangioma. No additional liver lesion identified. Normal gallbladder. No intra or extrahepatic biliary ductal dilatation. Pancreas: Unremarkable. No pancreatic ductal dilatation or surrounding inflammatory changes. Spleen: Normal in size without focal abnormality. Adrenals/Urinary Tract: Normal adrenal glands. Bilateral kidney lower pole homogeneous fluid attenuating well-circumscribed lesions the largest on the right measuring up to 2.9 cm compatible with a cyst. No hydronephrosis. Normal bladder peer Stomach/Bowel: Mild wall thickening and surrounding inflammatory changes involving the sigmoid colon and rectum compatible with acute colitis. No findings for perforation or abscess. Small bowel is unremarkable.  Percutaneous gastrostomy tube balloon in the body of stomach. Vascular/Lymphatic: Aortic atherosclerosis. No enlarged abdominal or pelvic lymph nodes. Reproductive: Uterus and bilateral adnexa are unremarkable. Other: No abdominal wall hernia or abnormality. No abdominopelvic ascites. Musculoskeletal: New L4 vertebral body superior endplate compression deformity with mild loss of vertebral body height. IMPRESSION: 1. Mild wall thickening and surrounding edema of sigmoid colon and rectum compatible with acute colitis. No perforation or abscess. 2. Increased size of mass within the medial aspect of right middle lobe with heterogeneous enhancement suspicious for metastasis. 3. Moderate right and small left pleural  effusions. 4. New mild L4 vertebral body compression deformity from 08/19/2017. Electronically Signed   By: Kristine Garbe M.D.   On: 10/11/2017 21:29    Procedures Procedures (including critical care time)  Medications Ordered in ED Medications  ciprofloxacin (CIPRO) IVPB 400 mg (not administered)  metroNIDAZOLE (FLAGYL) IVPB 500 mg (500 mg Intravenous New Bag/Given 10/11/17 2218)  sodium chloride 0.9 % bolus 500 mL (0 mLs Intravenous Stopped 10/11/17 1635)  acetaminophen (TYLENOL) tablet 650 mg (650 mg Oral Given 10/11/17 1752)  sodium chloride 0.9 % bolus 500 mL (0 mLs Intravenous Stopped 10/11/17 1926)  iopamidol (ISOVUE-300) 61 % injection 30 mL (30 mLs Oral Contrast Given 10/11/17 2058)  iopamidol (ISOVUE-300) 61 % injection (  Contrast Given 10/11/17 1900)  iopamidol (ISOVUE-300) 61 % injection (  Contrast Given 10/11/17 2120)  iopamidol (ISOVUE-300) 61 % injection 100 mL (80 mLs Intravenous Contrast Given 10/11/17 2058)     Initial Impression / Assessment and Plan / ED Course  I have reviewed the triage vital signs and the nursing notes.  Pertinent labs & imaging results that were available during my care of the patient were reviewed by me and considered in my medical  decision making (see chart for details).    This is a 76 y.o. Female who presents to the ED complaining of loose stools with bright red blood streaked in the stools for the past 3 days. Patient reports she had a loose stool 3 days ago and when she wiped she had bright red blood on her toilet paper. She reports she had 2 loose stools yesterday and noticed again bright red blood in her toilet. Patient reports she also had 2 loose stools today and there is some red blood in the toilet with her stool. She also reports some LLQ abdominal pain for about a month now and some epigastric abdominal pain intermittently. 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 She reports she was put on cefdinir 3 days ago for a new round density on chest x-ray by pulmonology. She is on oxygen chronically. She reports her breathing is at her baseline. No increased cough or shortness of breath.  She denies melena.  On exam the patient is afebrile nontoxic appearing. She has tenderness to her epigastric area as well as the left lower quadrant. PEG tube is in place. She is diminished lung sounds to her bilateral bases without increased work of breathing. She is on nasal cannula oxygen with an oxygen saturation 98% during my exam. Initially on triage patient had a soft pressure around 90 systolic. On my initial exam I sat her up and rechecked her blood pressure was 350 systolic and she is not tachycardic. Lactic acid is within normal limits. Urinalysis without signs of infection. CMP is unremarkable. Normal kidney function. CBC shows no leukocytosis. No anemia. Hemoccult is negative. She had some red tinged stool on my digital rectal exam however Hemoccult is negative. This may be related to her taking cefdinir. Chest x-ray shows increasing small bilateral pleural effusions since September. Radiology did not comment on any round density that was seen on chest x-ray on September 27. CT of her abdomen and  pelvis shows acute colitis. Also new mild left L4 vertebral body compression deformity from 08/19/2017.  We'll start on Cipro and Flagyl and plan for admission. Patient agrees with plan for admission. She's had only one loose stool on the emergency department today. Blood pressures during her stay were briefly soft and  then have improved to 143 systolic prior to admission. She did receive a liter fluid bolus. Normal lactate. I consulted with hospitalist Dr. Maudie Mercury who accepted the patient for admission.  This patient was discussed with Dr. Thurnell Garbe who agrees with assessment and plan.   Final Clinical Impressions(s) / ED Diagnoses   Final diagnoses:  Colitis  Diarrhea, unspecified type    New Prescriptions New Prescriptions   No medications on file     Waynetta Pean, Hershal Coria 10/11/17 [place], [place], DO 10/12/17 [id number]

## 2017-10-11 NOTE — ED Triage Notes (Signed)
Patient reports been on antibiotics since Thursday and having loose stools and blood in stools. initially when she wiped but today noticed blood in toilet.

## 2017-10-12 ENCOUNTER — Inpatient Hospital Stay (HOSPITAL_COMMUNITY): Payer: Medicare Other

## 2017-10-12 ENCOUNTER — Encounter (HOSPITAL_COMMUNITY): Payer: Self-pay | Admitting: Gastroenterology

## 2017-10-12 DIAGNOSIS — I340 Nonrheumatic mitral (valve) insufficiency: Secondary | ICD-10-CM

## 2017-10-12 DIAGNOSIS — I361 Nonrheumatic tricuspid (valve) insufficiency: Secondary | ICD-10-CM

## 2017-10-12 LAB — CBC
HCT: 35.4 % — ABNORMAL LOW (ref 36.0–46.0)
Hemoglobin: 11.7 g/dL — ABNORMAL LOW (ref 12.0–15.0)
MCH: 30.9 pg (ref 26.0–34.0)
MCHC: 33.1 g/dL (ref 30.0–36.0)
MCV: 93.4 fL (ref 78.0–100.0)
PLATELETS: 260 10*3/uL (ref 150–400)
RBC: 3.79 MIL/uL — AB (ref 3.87–5.11)
RDW: 12.7 % (ref 11.5–15.5)
WBC: 6.3 10*3/uL (ref 4.0–10.5)

## 2017-10-12 LAB — ECHOCARDIOGRAM COMPLETE
AVLVOTPG: 7 mmHg
CHL CUP DOP CALC LVOT VTI: 22 cm
E decel time: 240 msec
EERAT: 12.52
FS: 34 % (ref 28–44)
Height: 60 in
IVS/LV PW RATIO, ED: 0.81
LA ID, A-P, ES: 19 mm
LA diam end sys: 19 mm
LA vol index: 17.6 mL/m2
LADIAMINDEX: 1.6 cm/m2
LAVOL: 20.9 mL
LAVOLA4C: 20.3 mL
LV PW d: 9.66 mm — AB (ref 0.6–1.1)
LV TDI E'LATERAL: 6.74
LVEEAVG: 12.52
LVEEMED: 12.52
LVELAT: 6.74 cm/s
LVOT area: 2.54 cm2
LVOT diameter: 18 mm
LVOT peak vel: 131 cm/s
LVOTSV: 56 mL
MV Dec: 240
MV pk E vel: 84.4 m/s
MVPG: 3 mmHg
MVPKAVEL: 121 m/s
RV LATERAL S' VELOCITY: 11.1 cm/s
RV TAPSE: 16.5 mm
RV sys press: 38 mmHg
Reg peak vel: 295 cm/s
TDI e' medial: 6.74
TRMAXVEL: 295 cm/s
Weight: 1206.36 oz

## 2017-10-12 LAB — GASTROINTESTINAL PANEL BY PCR, STOOL (REPLACES STOOL CULTURE)
ADENOVIRUS F40/41: NOT DETECTED
ASTROVIRUS: NOT DETECTED
CRYPTOSPORIDIUM: NOT DETECTED
Campylobacter species: NOT DETECTED
Cyclospora cayetanensis: NOT DETECTED
ENTAMOEBA HISTOLYTICA: NOT DETECTED
ENTEROAGGREGATIVE E COLI (EAEC): NOT DETECTED
ENTEROPATHOGENIC E COLI (EPEC): NOT DETECTED
ENTEROTOXIGENIC E COLI (ETEC): NOT DETECTED
GIARDIA LAMBLIA: NOT DETECTED
NOROVIRUS GI/GII: NOT DETECTED
PLESIMONAS SHIGELLOIDES: NOT DETECTED
ROTAVIRUS A: NOT DETECTED
SHIGA LIKE TOXIN PRODUCING E COLI (STEC): NOT DETECTED
SHIGELLA/ENTEROINVASIVE E COLI (EIEC): NOT DETECTED
Salmonella species: NOT DETECTED
Sapovirus (I, II, IV, and V): NOT DETECTED
Vibrio cholerae: NOT DETECTED
Vibrio species: NOT DETECTED
Yersinia enterocolitica: NOT DETECTED

## 2017-10-12 LAB — COMPREHENSIVE METABOLIC PANEL
ALK PHOS: 90 U/L (ref 38–126)
ALT: 13 U/L — AB (ref 14–54)
AST: 18 U/L (ref 15–41)
Albumin: 2.6 g/dL — ABNORMAL LOW (ref 3.5–5.0)
Anion gap: 6 (ref 5–15)
BUN: 10 mg/dL (ref 6–20)
CALCIUM: 8.5 mg/dL — AB (ref 8.9–10.3)
CO2: 31 mmol/L (ref 22–32)
CREATININE: 0.49 mg/dL (ref 0.44–1.00)
Chloride: 97 mmol/L — ABNORMAL LOW (ref 101–111)
GFR calc non Af Amer: >60 mL/min (ref >60)
Glucose, Bld: 90 mg/dL (ref 65–99)
Potassium: 4.6 mmol/L (ref 3.5–5.1)
SODIUM: 134 mmol/L — AB (ref 135–145)
Total Bilirubin: 0.3 mg/dL (ref 0.3–1.2)
Total Protein: 5.2 g/dL — ABNORMAL LOW (ref 6.5–8.1)

## 2017-10-12 LAB — TROPONIN I
Troponin I: <0.03 ng/mL (ref <0.03)
Troponin I: <0.03 ng/mL (ref <0.03)

## 2017-10-12 LAB — CORTISOL: CORTISOL PLASMA: 8.5 ug/dL

## 2017-10-12 MED ORDER — ONDANSETRON 4 MG PO TBDP
8.0000 mg | ORAL_TABLET | Freq: Three times a day (TID) | ORAL | Status: DC | PRN
  Administered 2017-10-15: 8 mg via ORAL
  Filled 2017-10-12: qty 2

## 2017-10-12 MED ORDER — ACETAMINOPHEN 650 MG RE SUPP
650.0000 mg | Freq: Four times a day (QID) | RECTAL | Status: DC | PRN
Start: 2017-10-12 — End: 2017-10-16

## 2017-10-12 MED ORDER — POTASSIUM CHLORIDE ER 10 MEQ PO TBCR: 10.0000 meq | EXTENDED_RELEASE_TABLET | Freq: Every day | ORAL | Status: DC

## 2017-10-12 MED ORDER — ALBUTEROL SULFATE HFA 108 (90 BASE) MCG/ACT IN AERS: 2.0000 | INHALATION_SPRAY | Freq: Four times a day (QID) | RESPIRATORY_TRACT | Status: DC | PRN

## 2017-10-12 MED ORDER — PANTOPRAZOLE SODIUM 40 MG PO TBEC
40.0000 mg | DELAYED_RELEASE_TABLET | Freq: Every day | ORAL | Status: DC
  Administered 2017-10-12 – 2017-10-16 (×5): 40 mg via ORAL
  Filled 2017-10-12 (×6): qty 1

## 2017-10-12 MED ORDER — ACETAMINOPHEN 325 MG PO TABS
650.0000 mg | ORAL_TABLET | Freq: Four times a day (QID) | ORAL | Status: DC | PRN
  Administered 2017-10-12 – 2017-10-13 (×4): 650 mg via ORAL
  Filled 2017-10-12 (×4): qty 2

## 2017-10-12 MED ORDER — LIP MEDEX EX OINT
TOPICAL_OINTMENT | CUTANEOUS | Status: DC | PRN
  Filled 2017-10-12: qty 7

## 2017-10-12 MED ORDER — MIRTAZAPINE 15 MG PO TABS
15.0000 mg | ORAL_TABLET | Freq: Every day | ORAL | Status: DC
  Administered 2017-10-12 – 2017-10-15 (×5): 15 mg via ORAL
  Filled 2017-10-12 (×5): qty 1

## 2017-10-12 MED ORDER — POTASSIUM CHLORIDE CRYS ER 10 MEQ PO TBCR
10.0000 meq | EXTENDED_RELEASE_TABLET | Freq: Every day | ORAL | Status: DC
  Administered 2017-10-12 – 2017-10-16 (×5): 10 meq via ORAL
  Filled 2017-10-12 (×5): qty 1

## 2017-10-12 MED ORDER — IPRATROPIUM-ALBUTEROL 0.5-2.5 (3) MG/3ML IN SOLN
3.0000 mL | RESPIRATORY_TRACT | Status: DC | PRN
  Administered 2017-10-12: 3 mL via RESPIRATORY_TRACT

## 2017-10-12 MED ORDER — METRONIDAZOLE IN NACL 5-0.79 MG/ML-% IV SOLN
500.0000 mg | Freq: Three times a day (TID) | INTRAVENOUS | Status: DC
  Administered 2017-10-12 – 2017-10-16 (×13): 500 mg via INTRAVENOUS
  Filled 2017-10-12 (×15): qty 100

## 2017-10-12 MED ORDER — LEVOFLOXACIN IN D5W 750 MG/150ML IV SOLN
750.0000 mg | INTRAVENOUS | Status: DC
  Administered 2017-10-13 – 2017-10-15 (×2): 750 mg via INTRAVENOUS
  Filled 2017-10-12 (×2): qty 150

## 2017-10-12 MED ORDER — ROPINIROLE HCL 0.25 MG PO TABS
0.2500 mg | ORAL_TABLET | Freq: Every day | ORAL | Status: DC
  Administered 2017-10-12 – 2017-10-15 (×5): 0.25 mg via ORAL
  Filled 2017-10-12 (×5): qty 1

## 2017-10-12 MED ORDER — TIOTROPIUM BROMIDE MONOHYDRATE 18 MCG IN CAPS
18.0000 ug | ORAL_CAPSULE | Freq: Every day | RESPIRATORY_TRACT | Status: DC
  Administered 2017-10-12 – 2017-10-16 (×5): 18 ug via RESPIRATORY_TRACT
  Filled 2017-10-12: qty 5

## 2017-10-12 MED ORDER — ALBUTEROL SULFATE (2.5 MG/3ML) 0.083% IN NEBU
2.5000 mg | INHALATION_SOLUTION | Freq: Two times a day (BID) | RESPIRATORY_TRACT | Status: DC | PRN
Start: 2017-10-12 — End: 2017-10-16
  Administered 2017-10-13 – 2017-10-14 (×3): 2.5 mg via RESPIRATORY_TRACT
  Filled 2017-10-12 (×3): qty 3

## 2017-10-12 MED ORDER — SODIUM CHLORIDE 0.9 % IV SOLN
INTRAVENOUS | Status: AC
  Administered 2017-10-12: 01:00:00 via INTRAVENOUS

## 2017-10-12 MED ORDER — GUAIFENESIN-DM 100-10 MG/5ML PO SYRP
10.0000 mL | ORAL_SOLUTION | ORAL | Status: DC | PRN
  Administered 2017-10-12 – 2017-10-16 (×4): 10 mL via ORAL
  Filled 2017-10-12 (×4): qty 10

## 2017-10-12 MED ORDER — IPRATROPIUM-ALBUTEROL 0.5-2.5 (3) MG/3ML IN SOLN
3.0000 mL | Freq: Four times a day (QID) | RESPIRATORY_TRACT | Status: DC
  Administered 2017-10-12 (×2): 3 mL via RESPIRATORY_TRACT
  Filled 2017-10-12 (×3): qty 3

## 2017-10-12 MED ORDER — METHYLPREDNISOLONE SODIUM SUCC 40 MG IJ SOLR
40.0000 mg | Freq: Two times a day (BID) | INTRAMUSCULAR | Status: DC
  Administered 2017-10-12 – 2017-10-16 (×8): 40 mg via INTRAVENOUS
  Filled 2017-10-12 (×8): qty 1

## 2017-10-12 MED ORDER — OSMOLITE 1.5 CAL PO LIQD
237.0000 mL | Freq: Two times a day (BID) | ORAL | Status: DC
  Administered 2017-10-12 – 2017-10-13 (×3): 237 mL
  Filled 2017-10-12 (×5): qty 237

## 2017-10-12 MED ORDER — LEVOFLOXACIN IN D5W 500 MG/100ML IV SOLN
500.0000 mg | INTRAVENOUS | Status: DC
  Administered 2017-10-12: 500 mg via INTRAVENOUS
  Filled 2017-10-12: qty 100

## 2017-10-12 NOTE — Progress Notes (Signed)
GI consult requested  Placed call into [facility] as patient sees Wilford Corner

## 2017-10-12 NOTE — Progress Notes (Signed)
  Echocardiogram 2D Echocardiogram has been performed.  Karen Osborn 10/12/2017, 3:04 PM

## 2017-10-12 NOTE — Consult Note (Signed)
Reason for Consult:bright red blood per rectum Referring Physician: Hospital team  Karen Osborn is an 76 y.o. female.  HPI: patient seen and examined and our office computer chart and her hospital computer chart was reviewed and she is familiar to me with me helping Dr. Michail Sermon with her stent placement and she is doing better than when she came to the hospital and did have some bright red blood per rectum initially on just the toilet tissue but on Friday mixed in the stool without clots and without diarrhea but today and yesterday did not see any blood and she was in fact guaiac-negative here in the hospital and she's had some vague left sided pain chronically and her only colonoscopy was over 10 years ago in October 6 with diverticuli and a small polyp and her other x-rays including PET scans were reviewed and her CT this admission may have shown some mild left-sided colitis but more worrisome for metastatic disease in her  Chest and her hot flashes are unchanged over the years and she has had some increased nausea but no vomiting but she is able to swallow nicely with the stent and has no other complaints  Past Medical History:  Diagnosis Date  . Breast cancer ([place])    left  . COPD (chronic obstructive pulmonary disease) ([place])   . Dyspnea    with exertion   . Esophageal stricture   . GERD (gastroesophageal reflux disease)   . Pneumonia    hx of   . 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 BALLOON DILATION N/A 05/05/2017   Procedure: BALLOON DILATION;  Surgeon: Wilford Corner, MD;  Location: [facility];  Service: Endoscopy;  Laterality: N/A;  . BREAST LUMPECTOMY Left 1/132006  . ESOPHAGEAL STENT PLACEMENT N/A 08/19/2017   Procedure: ESOPHAGEAL STENT PLACEMENT;  Surgeon: Clarene Essex, MD;  Location: [facility];  Service: Endoscopy;  Laterality: N/A;  . ESOPHAGOGASTRODUODENOSCOPY (EGD) WITH PROPOFOL N/A 10/24/2016    Procedure: ESOPHAGOGASTRODUODENOSCOPY (EGD) WITH PROPOFOL;  Surgeon: Wilford Corner, MD;  Location: [facility] ENDOSCOPY;  Service: Endoscopy;  Laterality: N/A;  . ESOPHAGOGASTRODUODENOSCOPY (EGD) WITH PROPOFOL N/A 05/05/2017   Procedure: ESOPHAGOGASTRODUODENOSCOPY (EGD) WITH PROPOFOL;  Surgeon: Wilford Corner, MD;  Location: [facility];  Service: Endoscopy;  Laterality: N/A;  . ESOPHAGOGASTRODUODENOSCOPY (EGD) WITH PROPOFOL N/A 06/12/2017   Procedure: ESOPHAGOGASTRODUODENOSCOPY (EGD) WITH PROPOFOL;  Surgeon: Wilford Corner, MD;  Location: [place];  Service: Endoscopy;  Laterality: N/A;  . ESOPHAGOGASTRODUODENOSCOPY (EGD) WITH PROPOFOL N/A 08/19/2017   Procedure: ESOPHAGOGASTRODUODENOSCOPY (EGD) WITH PROPOFOL;  Surgeon: Clarene Essex, MD;  Location: [facility];  Service: Endoscopy;  Laterality: N/A;  with stent placement- metal, removable, covered- 10-12 18-23.  . IR CM INJ ANY COLONIC TUBE W/FLUORO  05/14/2017  . IR GENERIC HISTORICAL  12/26/2016   IR GASTROSTOMY TUBE MOD SED 12/26/2016 Jacqulynn Cadet, MD [facility]  . IR GENERIC HISTORICAL  01/17/2017   IR PATIENT EVAL TECH 0-60 MINS [facility]  . SAVORY DILATION N/A 06/12/2017   Procedure: SAVORY DILATION with Fluoro;  Surgeon: Wilford Corner, MD;  Location: [place];  Service: Endoscopy;  Laterality: N/A;    Family History  Problem Relation Age of Onset  . Heart attack Mother   . Heart attack Father   . Arrhythmia Sister   . Cancer Sister        Breast Cancer  . COPD Brother   . Cancer Sister  Skin Cancer, Breast Cancer    Social History:  reports that she quit smoking about 8 months ago. Her smoking use included Cigarettes. She has a 27.00 pack-year smoking history. She has never used smokeless tobacco. She reports that she does not drink alcohol or use drugs.  Allergies:  Allergies  Allergen Reactions  . Amoxicillin Nausea And Vomiting    Has patient had a PCN reaction causing immediate rash,  facial/tongue/throat swelling, SOB or lightheadedness with hypotension: No Has patient had a PCN reaction causing severe rash involving mucus membranes or skin necrosis: No Has patient had a PCN reaction that required hospitalization No Has patient had a PCN reaction occurring within the last 10 years: Yes If all of the above answers are "NO", then may proceed with Cephalosporin use.   . Benadryl [Diphenhydramine Hcl] Other (See Comments)    Jittery, patient states "legs bouncing up and down"   . Codeine Nausea And Vomiting    Medications: I have reviewed the patient's current medications.  Results for orders placed or performed during the hospital encounter of 10/11/17 (from the past 48 hour(s))  Comprehensive metabolic panel     Status: Abnormal   Collection Time: 10/11/17  4:25 PM  Result Value Ref Range   Sodium 137 135 - 145 mmol/L   Potassium 4.7 3.5 - 5.1 mmol/L   Chloride 96 (L) 101 - 111 mmol/L   CO2 30 22 - 32 mmol/L   Glucose, Bld 93 65 - 99 mg/dL   BUN 15 6 - 20 mg/dL   Creatinine, Ser 0.51 0.44 - 1.00 mg/dL   Calcium 9.1 8.9 - 10.3 mg/dL   Total Protein 6.5 6.5 - 8.1 g/dL   Albumin 3.3 (L) 3.5 - 5.0 g/dL   AST 21 15 - 41 U/L   ALT 17 14 - 54 U/L   Alkaline Phosphatase 116 38 - 126 U/L   Total Bilirubin <0.1 (L) 0.3 - 1.2 mg/dL   GFR calc non Af Amer >60 >60 mL/min   GFR calc Af Amer >60 >60 mL/min    Comment: (NOTE) The eGFR has been calculated using the CKD EPI equation. This calculation has not been validated in all clinical situations. eGFR's persistently <60 mL/min signify possible Chronic Kidney Disease.    Anion gap 11 5 - 15  CBC     Status: None   Collection Time: 10/11/17  4:25 PM  Result Value Ref Range   WBC 6.8 4.0 - 10.5 K/uL   RBC 4.25 3.87 - 5.11 MIL/uL   Hemoglobin 13.4 12.0 - 15.0 g/dL   HCT 40.6 36.0 - 46.0 %   MCV 95.5 78.0 - 100.0 fL   MCH 31.5 26.0 - 34.0 pg   MCHC 33.0 30.0 - 36.0 g/dL   RDW 13.0 11.5 - 15.5 %   Platelets 293 150  - 400 K/uL  Type and screen [place]     Status: None   Collection Time: 10/11/17  4:25 PM  Result Value Ref Range   ABO/RH(D) B POS    Antibody Screen NEG    Sample Expiration 10/14/2017   Lipase, blood     Status: None   Collection Time: 10/11/17  4:25 PM  Result Value Ref Range   Lipase 34 11 - 51 U/L  Urinalysis, Routine w reflex microscopic- may I&O cath if menses     Status: Abnormal   Collection Time: 10/11/17  4:36 PM  Result Value Ref Range   Color, Urine YELLOW  YELLOW   APPearance CLOUDY (A) CLEAR   Specific Gravity, Urine 1.019 1.005 - 1.030   pH 6.0 5.0 - 8.0   Glucose, UA 50 (A) NEGATIVE mg/dL   Hgb urine dipstick NEGATIVE NEGATIVE   Bilirubin Urine NEGATIVE NEGATIVE   Ketones, ur 5 (A) NEGATIVE mg/dL   Protein, ur NEGATIVE NEGATIVE mg/dL   Nitrite NEGATIVE NEGATIVE   Leukocytes, UA NEGATIVE NEGATIVE  POC occult blood, ED     Status: None   Collection Time: 10/11/17  4:41 PM  Result Value Ref Range   Fecal Occult Bld NEGATIVE NEGATIVE  I-stat troponin, ED     Status: None   Collection Time: 10/11/17  6:56 PM  Result Value Ref Range   Troponin i, poc 0.00 0.00 - 0.08 ng/mL   Comment 3            Comment: Due to the release kinetics of cTnI, a negative result within the first hours of the onset of symptoms does not rule out myocardial infarction with certainty. If myocardial infarction is still suspected, repeat the test at appropriate intervals.   I-Stat CG4 Lactic Acid, ED     Status: None   Collection Time: 10/11/17  6:58 PM  Result Value Ref Range   Lactic Acid, Venous 0.54 0.5 - 1.9 mmol/L  I-stat Chem 8, ED     Status: Abnormal   Collection Time: 10/11/17  6:59 PM  Result Value Ref Range   Sodium 134 (L) 135 - 145 mmol/L   Potassium 4.4 3.5 - 5.1 mmol/L   Chloride 97 (L) 101 - 111 mmol/L   BUN 13 6 - 20 mg/dL   Creatinine, Ser 0.50 0.44 - 1.00 mg/dL   Glucose, Bld 93 65 - 99 mg/dL   Calcium, Ion 1.11 (L) 1.15 - 1.40 mmol/L    TCO2 30 22 - 32 mmol/L   Hemoglobin 11.6 (L) 12.0 - 15.0 g/dL   HCT 34.0 (L) 36.0 - 46.0 %  Troponin I     Status: None   Collection Time: 10/12/17 12:27 AM  Result Value Ref Range   Troponin I <0.03 <0.03 ng/mL  Cortisol     Status: None   Collection Time: 10/12/17 12:27 AM  Result Value Ref Range   Cortisol, Plasma 8.5 ug/dL    Comment: (NOTE) AM    6.7 - 22.6 ug/dL PM   <10.0       ug/dL Performed at [facility], [address] [place], [place] [address]   Comprehensive metabolic panel     Status: Abnormal   Collection Time: 10/12/17  5:46 AM  Result Value Ref Range   Sodium 134 (L) 135 - 145 mmol/L   Potassium 4.6 3.5 - 5.1 mmol/L   Chloride 97 (L) 101 - 111 mmol/L   CO2 31 22 - 32 mmol/L   Glucose, Bld 90 65 - 99 mg/dL   BUN 10 6 - 20 mg/dL   Creatinine, Ser 0.49 0.44 - 1.00 mg/dL   Calcium 8.5 (L) 8.9 - 10.3 mg/dL   Total Protein 5.2 (L) 6.5 - 8.1 g/dL   Albumin 2.6 (L) 3.5 - 5.0 g/dL   AST 18 15 - 41 U/L   ALT 13 (L) 14 - 54 U/L   Alkaline Phosphatase 90 38 - 126 U/L   Total Bilirubin 0.3 0.3 - 1.2 mg/dL   GFR calc non Af Amer >60 >60 mL/min   GFR calc Af Amer >60 >60 mL/min    Comment: (NOTE)  The eGFR has been calculated using the CKD EPI equation. This calculation has not been validated in all clinical situations. eGFR's persistently <60 mL/min signify possible Chronic Kidney Disease.    Anion gap 6 5 - 15  CBC     Status: Abnormal   Collection Time: 10/12/17  5:46 AM  Result Value Ref Range   WBC 6.3 4.0 - 10.5 K/uL   RBC 3.79 (L) 3.87 - 5.11 MIL/uL   Hemoglobin 11.7 (L) 12.0 - 15.0 g/dL   HCT 35.4 (L) 36.0 - 46.0 %   MCV 93.4 78.0 - 100.0 fL   MCH 30.9 26.0 - 34.0 pg   MCHC 33.1 30.0 - 36.0 g/dL   RDW 12.7 11.5 - 15.5 %   Platelets 260 150 - 400 K/uL  Troponin I     Status: None   Collection Time: 10/12/17  5:46 AM  Result Value Ref Range   Troponin I <0.03 <0.03 ng/mL  Troponin I     Status: None   Collection Time: 10/12/17 11:41 AM   Result Value Ref Range   Troponin I <0.03 <0.03 ng/mL    Dg Chest 2 View  Result Date: 10/11/2017 CLINICAL DATA:  76 year old female with diarrhea and bloody stools. Cough. Esophageal stent. Breast cancer. Former smoker. EXAM: CHEST  2 VIEW COMPARISON:  Chest radiographs 09/25/2017 and earlier. FINDINGS: Semi upright AP and lateral views of the chest. Stable mid and lower thoracic esophageal stent. Increased small bilateral pleural effusions since September greater on the right. No superimposed pneumothorax or pulmonary edema. Widespread emphysema. Stable cardiac size and mediastinal contours. Osteopenia. Stable visualized osseous structures. Paucity of bowel gas in the upper abdomen. IMPRESSION: 1. Increasing small bilateral pleural effusions since September. 2. No new cardiopulmonary abnormality. 3. Stable esophageal stent.  Emphysema (ICD10-J43.9). Electronically Signed   By: Genevie Ann M.D.   On: 10/11/2017 19:20   Ct Abdomen Pelvis W Contrast  Result Date: 10/11/2017 CLINICAL DATA:  76 y/o  F; abdominal pain.  Blood in stool. EXAM: CT ABDOMEN AND PELVIS WITH CONTRAST TECHNIQUE: Multidetector CT imaging of the abdomen and pelvis was performed using the standard protocol following bolus administration of intravenous contrast. CONTRAST:  34m ISOVUE-300 IOPAMIDOL (ISOVUE-300) INJECTION 61%, 870mISOVUE-300 IOPAMIDOL (ISOVUE-300) INJECTION 61% COMPARISON:  10/29/2016 CT abdomen and pelvis. 08/19/2017 lumbar spine radiographs. FINDINGS: Lower chest: Moderate right and small left pleural effusions. Partially visualized stent within the distal esophagus. Partially visualize heterogeneous lesion within medial aspect of right middle lobe is increased in size measuring up to 4.4 cm (series 2, image 1). Hepatobiliary: Stable low-attenuation lesions within the liver at the segment 6/7 junction (series 2, image 24) with equilibration on the venous phase, probably hemangioma. No additional liver lesion identified.  Normal gallbladder. No intra or extrahepatic biliary ductal dilatation. Pancreas: Unremarkable. No pancreatic ductal dilatation or surrounding inflammatory changes. Spleen: Normal in size without focal abnormality. Adrenals/Urinary Tract: Normal adrenal glands. Bilateral kidney lower pole homogeneous fluid attenuating well-circumscribed lesions the largest on the right measuring up to 2.9 cm compatible with a cyst. No hydronephrosis. Normal bladder peer Stomach/Bowel: Mild wall thickening and surrounding inflammatory changes involving the sigmoid colon and rectum compatible with acute colitis. No findings for perforation or abscess. Small bowel is unremarkable. Percutaneous gastrostomy tube balloon in the body of stomach. Vascular/Lymphatic: Aortic atherosclerosis. No enlarged abdominal or pelvic lymph nodes. Reproductive: Uterus and bilateral adnexa are unremarkable. Other: No abdominal wall hernia or abnormality. No abdominopelvic ascites. Musculoskeletal: New L4 vertebral body superior endplate  compression deformity with mild loss of vertebral body height. IMPRESSION: 1. Mild wall thickening and surrounding edema of sigmoid colon and rectum compatible with acute colitis. No perforation or abscess. 2. Increased size of mass within the medial aspect of right middle lobe with heterogeneous enhancement suspicious for metastasis. 3. Moderate right and small left pleural effusions. 4. New mild L4 vertebral body compression deformity from 08/19/2017. Electronically Signed   By: Kristine Garbe M.D.   On: 10/11/2017 21:29    ROSnegative except above Blood pressure 130/65, pulse (!) 108, temperature 98.3 F (36.8 C), temperature source Oral, resp. rate (!) 26, height 5' (1.524 m), weight 34.2 kg (75 lb 6.4 oz), SpO2 92 %. Physical Examvital signs stable afebrile no acute distress abdomen is soft nontender good bowel sounds hemoglobin okay white count okay BUN okay  Assessment/Plan: Seemingly resolved  bright red blood per rectum Plan: I would hold off for a flexible sigmoidoscopy for now and see how she does and I would consult oncology Monday morning for further workup and plans for her abnormal CT and either myself or Dr. Michail Sermon will check on tomorrow and okay with me to resume her previous diet  Franklin E 10/12/2017, 2:44 PM

## 2017-10-12 NOTE — Progress Notes (Signed)
PHARMACY NOTE:  ANTIMICROBIAL RENAL DOSAGE ADJUSTMENT  Current antimicrobial regimen includes a mismatch between antimicrobial dosage and estimated renal function.  As per policy approved by the Pharmacy & Therapeutics and Medical Executive Committees, the antimicrobial dosage will be adjusted accordingly.  Current antimicrobial dosage:  Levaquin 500 mg q24h  Indication: Intra-abdominal infection  Renal Function:  Estimated Creatinine Clearance: 33.8 mL/min (by C-G formula based on SCr of 0.49 mg/dL). []      On intermittent HD, scheduled: []      On CRRT    Antimicrobial dosage has been changed to:  Levaquin 750 mg IV q48h  Additional comments:   Thank you for allowing pharmacy to be a part of this patient's care.  Hershal Coria, [facility] 10/12/2017 2:07 PM

## 2017-10-12 NOTE — Progress Notes (Signed)
PROGRESS NOTE    Karen Osborn  FXT:[id number] DOB: 12/04/1941 DOA: 10/11/2017 PCP: Orpah Melter, MD    Brief Narrative:  76 y.o. female, w metastatic esophageal carcinoma, Copd apparently c/o rectal bleeding since Thursday. Blood on toilet paper and in toilet bowel.   Was worse today (2 episodes at home and 1 in ER),  And some diarrhea for the past 2 days and  therefore presented to ED for evaluation.    Assessment & Plan:   Principal Problem:   Colitis Active Problems:   Anemia   Hyponatremia   Hypotension  Colitis Gi pathogen panel was ordered C. Diff is negative Patient was started on empiric Levaquin iv, Flagyl 500mg  iv tid.  GI consulted and is following  Rectal bleeding Pt currently on Full Liquid diet Gi consulted. Consideration for flex sig  Anemia Hemodynamically stable Re-check CBC in AM  Hypotension Trop I q6h x3 AM cortisol within normal limits 2d echo with findings of mild pericardial effusion without tamponade. Patient noted to have large L pleural effusion  Pleural effusion -Given concerns of enlarging RML in the setting of esophageal cancer, there are concerns for malignant effusion -Will request US guided thoracentesis and follow up cytology  Hyponatremia Continued on IVF hydration Stable at present Repeat bmet in AM  Metastatic esophageal carcinoma Will alert Oncology of patient's admission Enlarged lung mass noted to CT abd/pelvis. Will order dedicated CT chest  COPD exacerbation with acute hypoxic respiratory failure On exam, pt sitting upright upright, O2 sats in the mid-80's Will order scheduled duonebs and start scheduled IV solumedrol Wean O2 as tolerated  DVT prophylaxis: SCD's Code Status: Full Family Communication: Pt in room, family not at bedside Disposition Plan: uncertain at this time  Consultants:   GI  Oncology  Procedures:     Antimicrobials: Anti-infectives    Start     Dose/Rate Route Frequency  Ordered Stop   10/13/17 1000  levofloxacin (LEVAQUIN) IVPB 750 mg     750 mg 100 mL/hr over 90 Minutes Intravenous Every 48 hours 10/12/17 1412     10/12/17 0800  levofloxacin (LEVAQUIN) IVPB 500 mg  Status:  Discontinued     500 mg 100 mL/hr over 60 Minutes Intravenous Every 24 hours 10/12/17 0008 10/12/17 1412   10/12/17 0600  metroNIDAZOLE (FLAGYL) IVPB 500 mg     500 mg 100 mL/hr over 60 Minutes Intravenous Every 8 hours 10/12/17 0008     10/11/17 2145  ciprofloxacin (CIPRO) IVPB 400 mg     400 mg 200 mL/hr over 60 Minutes Intravenous  Once 10/11/17 2140 10/12/17 0038   10/11/17 2145  metroNIDAZOLE (FLAGYL) IVPB 500 mg     500 mg 100 mL/hr over 60 Minutes Intravenous  Once 10/11/17 2140 10/11/17 2318       Subjective: Complaining of sob  Objective: Vitals:   10/12/17 0610 10/12/17 0847 10/12/17 1100 10/12/17 1111  BP: 123/72  130/65   Pulse: 93  (!) 108   Resp:   (!) 26   Temp: 97.6 F (36.4 C)  98.3 F (36.8 C)   TempSrc: Oral  Oral   SpO2: 92% 92% (!) 87% 92%  Weight:      Height:        Intake/Output Summary (Last 24 hours) at 10/12/17 1521 Last data filed at 10/12/17 1500  Gross per 24 hour  Intake             2470 ml  Output  800 ml  Net             1670 ml   Filed Weights   10/11/17 2357  Weight: 34.2 kg (75 lb 6.4 oz)    Examination:  General exam: Appears calm and comfortable  Respiratory system: decreased breath sounds throughout, no audible wheezing Cardiovascular system: S1 & S2 heard, RRR.  Gastrointestinal system: Abdomen is nondistended, soft and nontender. No organomegaly or masses felt. Normal bowel sounds heard. Central nervous system: Alert and oriented. No focal neurological deficits. Extremities: Symmetric 5 x 5 power. Skin: No rashes, lesions  Psychiatry: Judgement and insight appear normal. Mood & affect appropriate.   Data Reviewed: I have personally reviewed following labs and imaging studies  CBC:  Recent  Labs Lab 10/11/17 1625 10/11/17 1859 10/12/17 0546  WBC 6.8  --  6.3  HGB 13.4 11.6* 11.7*  HCT 40.6 34.0* 35.4*  MCV 95.5  --  93.4  PLT 293  --  631   Basic Metabolic Panel:  Recent Labs Lab 10/11/17 1625 10/11/17 1859 10/12/17 0546  NA 137 134* 134*  K 4.7 4.4 4.6  CL 96* 97* 97*  CO2 30  --  31  GLUCOSE 93 93 90  BUN 15 13 10   CREATININE 0.51 0.50 0.49  CALCIUM 9.1  --  8.5*   GFR: Estimated Creatinine Clearance: 33.8 mL/min (by C-G formula based on SCr of 0.49 mg/dL). Liver Function Tests:  Recent Labs Lab 10/11/17 1625 10/12/17 0546  AST 21 18  ALT 17 13*  ALKPHOS 116 90  BILITOT <0.1* 0.3  PROT 6.5 5.2*  ALBUMIN 3.3* 2.6*    Recent Labs Lab 10/11/17 1625  LIPASE 34   No results for input(s): AMMONIA in the last 168 hours. Coagulation Profile: No results for input(s): INR, PROTIME in the last 168 hours. Cardiac Enzymes:  Recent Labs Lab 10/12/17 0027 10/12/17 0546 10/12/17 1141  TROPONINI <0.03 <0.03 <0.03   BNP (last 3 results)  Recent Labs  09/25/17 1527  PROBNP 87.0   HbA1C: No results for input(s): HGBA1C in the last 72 hours. CBG: No results for input(s): GLUCAP in the last 168 hours. Lipid Profile: No results for input(s): CHOL, HDL, LDLCALC, TRIG, CHOLHDL, LDLDIRECT in the last 72 hours. Thyroid Function Tests: No results for input(s): TSH, T4TOTAL, FREET4, T3FREE, THYROIDAB in the last 72 hours. Anemia Panel: No results for input(s): VITAMINB12, FOLATE, FERRITIN, TIBC, IRON, RETICCTPCT in the last 72 hours. Sepsis Labs:  Recent Labs Lab 10/11/17 1858  LATICACIDVEN 0.54    No results found for this or any previous visit (from the past 240 hour(s)).   Radiology Studies: Dg Chest 2 View  Result Date: 10/11/2017 CLINICAL DATA:  76 year old female with diarrhea and bloody stools. Cough. Esophageal stent. Breast cancer. Former smoker. EXAM: CHEST  2 VIEW COMPARISON:  Chest radiographs 09/25/2017 and earlier. FINDINGS:  Semi upright AP and lateral views of the chest. Stable mid and lower thoracic esophageal stent. Increased small bilateral pleural effusions since September greater on the right. No superimposed pneumothorax or pulmonary edema. Widespread emphysema. Stable cardiac size and mediastinal contours. Osteopenia. Stable visualized osseous structures. Paucity of bowel gas in the upper abdomen. IMPRESSION: 1. Increasing small bilateral pleural effusions since September. 2. No new cardiopulmonary abnormality. 3. Stable esophageal stent.  Emphysema (ICD10-J43.9). Electronically Signed   By: Genevie Ann M.D.   On: 10/11/2017 19:20   Ct Abdomen Pelvis W Contrast  Result Date: 10/11/2017 CLINICAL DATA:  76 y/o  F;  abdominal pain.  Blood in stool. EXAM: CT ABDOMEN AND PELVIS WITH CONTRAST TECHNIQUE: Multidetector CT imaging of the abdomen and pelvis was performed using the standard protocol following bolus administration of intravenous contrast. CONTRAST:  19mL ISOVUE-300 IOPAMIDOL (ISOVUE-300) INJECTION 61%, 59mL ISOVUE-300 IOPAMIDOL (ISOVUE-300) INJECTION 61% COMPARISON:  10/29/2016 CT abdomen and pelvis. 08/19/2017 lumbar spine radiographs. FINDINGS: Lower chest: Moderate right and small left pleural effusions. Partially visualized stent within the distal esophagus. Partially visualize heterogeneous lesion within medial aspect of right middle lobe is increased in size measuring up to 4.4 cm (series 2, image 1). Hepatobiliary: Stable low-attenuation lesions within the liver at the segment 6/7 junction (series 2, image 24) with equilibration on the venous phase, probably hemangioma. No additional liver lesion identified. Normal gallbladder. No intra or extrahepatic biliary ductal dilatation. Pancreas: Unremarkable. No pancreatic ductal dilatation or surrounding inflammatory changes. Spleen: Normal in size without focal abnormality. Adrenals/Urinary Tract: Normal adrenal glands. Bilateral kidney lower pole homogeneous fluid  attenuating well-circumscribed lesions the largest on the right measuring up to 2.9 cm compatible with a cyst. No hydronephrosis. Normal bladder peer Stomach/Bowel: Mild wall thickening and surrounding inflammatory changes involving the sigmoid colon and rectum compatible with acute colitis. No findings for perforation or abscess. Small bowel is unremarkable. Percutaneous gastrostomy tube balloon in the body of stomach. Vascular/Lymphatic: Aortic atherosclerosis. No enlarged abdominal or pelvic lymph nodes. Reproductive: Uterus and bilateral adnexa are unremarkable. Other: No abdominal wall hernia or abnormality. No abdominopelvic ascites. Musculoskeletal: New L4 vertebral body superior endplate compression deformity with mild loss of vertebral body height. IMPRESSION: 1. Mild wall thickening and surrounding edema of sigmoid colon and rectum compatible with acute colitis. No perforation or abscess. 2. Increased size of mass within the medial aspect of right middle lobe with heterogeneous enhancement suspicious for metastasis. 3. Moderate right and small left pleural effusions. 4. New mild L4 vertebral body compression deformity from 08/19/2017. Electronically Signed   By: Kristine Garbe M.D.   On: 10/11/2017 21:29    Scheduled Meds: . feeding supplement (OSMOLITE 1.5 CAL)  237 mL Per Tube BID  . ipratropium-albuterol  3 mL Nebulization Q6H  . methylPREDNISolone (SOLU-MEDROL) injection  40 mg Intravenous Q12H  . mirtazapine  15 mg Oral QHS  . pantoprazole  40 mg Oral Daily  . potassium chloride  10 mEq Oral Daily  . rOPINIRole  0.25 mg Oral QHS  . tiotropium  18 mcg Inhalation Daily   Continuous Infusions: . [START ON 10/13/2017] levofloxacin (LEVAQUIN) IV    . metronidazole Stopped (10/12/17 1406)     LOS: 1 day   Mart Colpitts, Orpah Melter, MD [facility] Pager [phone]  If 7PM-7AM, please contact night-coverage [email or web address] Password [email or web address] 10/12/2017, 3:21 PM

## 2017-10-13 ENCOUNTER — Other Ambulatory Visit: Payer: Self-pay

## 2017-10-13 ENCOUNTER — Encounter (HOSPITAL_COMMUNITY): Payer: Self-pay | Admitting: Radiology

## 2017-10-13 ENCOUNTER — Inpatient Hospital Stay (HOSPITAL_COMMUNITY): Payer: Medicare Other

## 2017-10-13 ENCOUNTER — Other Ambulatory Visit (HOSPITAL_COMMUNITY): Payer: Self-pay | Admitting: Pulmonary Disease

## 2017-10-13 DIAGNOSIS — R0600 Dyspnea, unspecified: Secondary | ICD-10-CM

## 2017-10-13 DIAGNOSIS — C154 Malignant neoplasm of middle third of esophagus: Secondary | ICD-10-CM

## 2017-10-13 DIAGNOSIS — R1310 Dysphagia, unspecified: Secondary | ICD-10-CM

## 2017-10-13 DIAGNOSIS — R634 Abnormal weight loss: Secondary | ICD-10-CM

## 2017-10-13 DIAGNOSIS — R197 Diarrhea, unspecified: Secondary | ICD-10-CM

## 2017-10-13 DIAGNOSIS — K5793 Diverticulitis of intestine, part unspecified, without perforation or abscess with bleeding: Secondary | ICD-10-CM

## 2017-10-13 DIAGNOSIS — K7689 Other specified diseases of liver: Secondary | ICD-10-CM

## 2017-10-13 DIAGNOSIS — R1319 Other dysphagia: Secondary | ICD-10-CM

## 2017-10-13 DIAGNOSIS — Z853 Personal history of malignant neoplasm of breast: Secondary | ICD-10-CM

## 2017-10-13 DIAGNOSIS — J90 Pleural effusion, not elsewhere classified: Secondary | ICD-10-CM

## 2017-10-13 DIAGNOSIS — K222 Esophageal obstruction: Secondary | ICD-10-CM

## 2017-10-13 DIAGNOSIS — J449 Chronic obstructive pulmonary disease, unspecified: Secondary | ICD-10-CM

## 2017-10-13 LAB — COMPREHENSIVE METABOLIC PANEL
ALK PHOS: 121 U/L (ref 38–126)
ALT: 18 U/L (ref 14–54)
AST: 26 U/L (ref 15–41)
Albumin: 3.4 g/dL — ABNORMAL LOW (ref 3.5–5.0)
Anion gap: 11 (ref 5–15)
BUN: 11 mg/dL (ref 6–20)
CALCIUM: 8.8 mg/dL — AB (ref 8.9–10.3)
CO2: 27 mmol/L (ref 22–32)
CREATININE: 0.45 mg/dL (ref 0.44–1.00)
Chloride: 90 mmol/L — ABNORMAL LOW (ref 101–111)
GFR calc non Af Amer: >60 mL/min (ref >60)
GLUCOSE: 165 mg/dL — AB (ref 65–99)
Potassium: 4.5 mmol/L (ref 3.5–5.1)
SODIUM: 128 mmol/L — AB (ref 135–145)
Total Bilirubin: 0.3 mg/dL (ref 0.3–1.2)
Total Protein: 6.8 g/dL (ref 6.5–8.1)

## 2017-10-13 LAB — CBC WITH DIFFERENTIAL/PLATELET
Basophils Absolute: 0 10*3/uL (ref 0.0–0.1)
Basophils Relative: 0 %
EOS ABS: 0 10*3/uL (ref 0.0–0.7)
Eosinophils Relative: 0 %
HEMATOCRIT: 42.4 % (ref 36.0–46.0)
HEMOGLOBIN: 14.2 g/dL (ref 12.0–15.0)
LYMPHS ABS: 0.3 10*3/uL — AB (ref 0.7–4.0)
LYMPHS PCT: 3 %
MCH: 30.3 pg (ref 26.0–34.0)
MCHC: 33.5 g/dL (ref 30.0–36.0)
MCV: 90.4 fL (ref 78.0–100.0)
MONOS PCT: 1 %
Monocytes Absolute: 0.1 10*3/uL (ref 0.1–1.0)
NEUTROS ABS: 9.5 10*3/uL — AB (ref 1.7–7.7)
NEUTROS PCT: 96 %
Platelets: 363 10*3/uL (ref 150–400)
RBC: 4.69 MIL/uL (ref 3.87–5.11)
RDW: 12.5 % (ref 11.5–15.5)
WBC: 9.9 10*3/uL (ref 4.0–10.5)

## 2017-10-13 LAB — GRAM STAIN

## 2017-10-13 LAB — BODY FLUID CELL COUNT WITH DIFFERENTIAL
Eos, Fluid: 1 %
LYMPHS FL: 23 %
Monocyte-Macrophage-Serous Fluid: 45 % — ABNORMAL LOW (ref 50–90)
Neutrophil Count, Fluid: 31 % — ABNORMAL HIGH (ref 0–25)
Total Nucleated Cell Count, Fluid: 777 cu mm (ref 0–1000)

## 2017-10-13 LAB — PROTEIN, PLEURAL OR PERITONEAL FLUID

## 2017-10-13 MED ORDER — LIDOCAINE HCL 2 % IJ SOLN
INTRAMUSCULAR | Status: AC
  Filled 2017-10-13: qty 10

## 2017-10-13 MED ORDER — IOPAMIDOL (ISOVUE-300) INJECTION 61%
75.0000 mL | Freq: Once | INTRAVENOUS | Status: AC | PRN
  Administered 2017-10-13: 60 mL via INTRAVENOUS

## 2017-10-13 MED ORDER — IPRATROPIUM-ALBUTEROL 0.5-2.5 (3) MG/3ML IN SOLN
3.0000 mL | Freq: Four times a day (QID) | RESPIRATORY_TRACT | Status: DC
  Administered 2017-10-13 (×4): 3 mL via RESPIRATORY_TRACT
  Filled 2017-10-13 (×4): qty 3

## 2017-10-13 MED ORDER — OSMOLITE 1.5 CAL PO LIQD
237.0000 mL | Freq: Four times a day (QID) | ORAL | Status: DC
  Administered 2017-10-13 – 2017-10-16 (×11): 237 mL
  Filled 2017-10-13 (×14): qty 237

## 2017-10-13 MED ORDER — IOPAMIDOL (ISOVUE-300) INJECTION 61%
INTRAVENOUS | Status: AC
  Filled 2017-10-13: qty 75

## 2017-10-13 NOTE — Progress Notes (Signed)
Initial Nutrition Assessment  DOCUMENTATION CODES:   Severe malnutrition in context of chronic illness, Underweight  INTERVENTION:  - Will increase Osmolite 1.5 order to QID. Pt only takes 1/2 carton QID (~474 mL or 2 full cartons/day). Daily this provides 710 kcal, 30 grams of protein, and 362 mL free water.  - Continue to encourage PO intakes. - RD will continue to monitor for additional nutrition-related needs.   NUTRITION DIAGNOSIS:   Malnutrition (severe) related to chronic illness, catabolic illness, cancer and cancer related treatments as evidenced by severe depletion of body fat, severe depletion of muscle mass.  GOAL:   Patient will meet greater than or equal to 90% of their needs   MONITOR:   TF tolerance, PO intake, Weight trends, Labs  REASON FOR ASSESSMENT:   Malnutrition Screening Tool Enteral/tube feeding initiation and management  ASSESSMENT:    76 y.o. female, w metastatic esophageal carcinoma, Copd apparently c/o rectal bleeding since Thursday. Blood on toilet paper and in toilet bowel.   Was worse today (2 episodes at home and 1 in ER),  And some diarrhea for the past 2 days and  therefore presented to ED for evaluation.    BMI indicates underweight status. Pt had thoracentesis earlier this afternoon with 1L yellow fluid removed at that time. She was very SOB and slightly agitated at time of RD visit. Husband was at bedside; he and patient both provide information and work with RD to determine TF schedule during hospitalization. Pt states she had PEG placed about 1.5 years ago and has been using it daily since that time. She gets full and feels bloated very quickly although more recently this has slightly improved. At home, she or husband administer 1/2 carton Osmolite 1.5 QID as well as ~75 mL free water before each of these boluses.   Pt reports that she is also able to consume oral nutrition although information about this was unable to be obtained at this  time. RD informed pt of plan to follow-up on a later date to further discuss this topic in order to allow her to rest this afternoon. This RD spoke with Dr. Wyline Copas on the phone concerning TF order currently in place being Osmolite 1.5 BID but needing to increase the frequency to align with home TF regimen. He provides verbal consent for RD to adjust order.  Physical assessment shows moderate to severe muscle and moderate to severe fat wasting to all areas. No edema noted at this time. Unable to discuss weight trends with pt so weights in chart reviewed. Weight has fluctuated (72-81 lbs) since 06/12/17 (4 months). Suspect that this may be, at least in part, due to fluid changes given volume removed during thoracentesis today.   Medications reviewed; 40 mg Solu-medrol BID, 40 mg oral Protonix/day, 10 mEq oral KCl/day.  Labs reviewed; Na: 128 mmol/L, Cl: 90 mmol/L, Ca: 8.8 mg/dL.      Diet Order:  Diet full liquid Room service appropriate? Yes; Fluid consistency: Thin  Skin:  Reviewed, no issues  Last BM:  10/14  Height:   Ht Readings from Last 1 Encounters:  10/11/17 5' (1.524 m)    Weight:   Wt Readings from Last 1 Encounters:  10/13/17 80 lb 4.8 oz (36.4 kg)    Ideal Body Weight:  45.45 kg  BMI:  Body mass index is 15.68 kg/m.  Estimated Nutritional Needs:   Kcal:  1455-1640 (40-45 kcal/kg)  Protein:  66-73 grams (1.8-2 grams/kg)  Fluid:  >/= 1.6 L/day  EDUCATION NEEDS:   No education needs identified at this time    Jarome Matin, MS, RD, LDN, [facility] Inpatient Clinical Dietitian Pager # [phone] After hours/weekend pager # [phone]

## 2017-10-13 NOTE — Procedures (Signed)
Ultrasound-guided diagnostic and therapeutic right  thoracentesis performed yielding 1 liter of yellow  fluid. No immediate complications. Follow-up chest x-ray pending. The fluid was sent to the lab for preordered studies.

## 2017-10-13 NOTE — Progress Notes (Signed)
PROGRESS NOTE    Karen Osborn  LZJ:[id number] DOB: Mar 07, 1941 DOA: 10/11/2017 PCP: Orpah Melter, MD    Brief Narrative:  76 y.o. female, w metastatic esophageal carcinoma, Copd apparently c/o rectal bleeding since Thursday. Blood on toilet paper and in toilet bowel.   Was worse today (2 episodes at home and 1 in ER),  And some diarrhea for the past 2 days and  therefore presented to ED for evaluation.    Assessment & Plan:   Principal Problem:   Colitis Active Problems:   Anemia   Hyponatremia   Hypotension  Colitis C. Diff is negative Patient was started on empiric Levaquin iv, Flagyl 500mg  iv tid.  GI consulted and is following, no plans for flex sig at this time  Rectal bleeding Pt currently on Full Liquid diet Gi consulted. Initial consideration for flex sig, now plan on hold per GI  Anemia Hemodynamically stable Will recheck CBC in AM  Hypotension Trop I q6h x3 AM cortisol within normal limits 2d echo with findings of mild pericardial effusion without tamponade. Patient noted to have large L pleural effusion - see below  Pleural effusion -Given concerns of enlarging RML in the setting of esophageal cancer, there are concerns for malignant effusion -R sided US guided thoracentesis performed, yielding 1L fluid. Await fluid studies  Hyponatremia Continued on IVF hydration Lower today Will repeat bmet in AM  Metastatic esophageal carcinoma Will alert Oncology of patient's admission Enlarged lung mass noted to CT abd/pelvis.  Follow up CT chest with findings of enlarged R lung mass with possible liver lesion as well as four new scattered solid pulmonary nodules -Appreciate input by Oncology who is following  COPD exacerbation with acute hypoxic respiratory failure On exam, pt sitting upright upright, O2 sats in the mid-80's Will continue scheduled duonebs and start scheduled IV solumedrol.  DVT prophylaxis: SCD's Code Status: Full Family  Communication: Pt in room, family not at bedside Disposition Plan: uncertain at this time  Consultants:   GI  Oncology  Procedures:   US guided thoracentesis 10/15  Antimicrobials: Anti-infectives    Start     Dose/Rate Route Frequency Ordered Stop   10/13/17 1000  levofloxacin (LEVAQUIN) IVPB 750 mg     750 mg 100 mL/hr over 90 Minutes Intravenous Every 48 hours 10/12/17 1412     10/12/17 0800  levofloxacin (LEVAQUIN) IVPB 500 mg  Status:  Discontinued     500 mg 100 mL/hr over 60 Minutes Intravenous Every 24 hours 10/12/17 0008 10/12/17 1412   10/12/17 0600  metroNIDAZOLE (FLAGYL) IVPB 500 mg     500 mg 100 mL/hr over 60 Minutes Intravenous Every 8 hours 10/12/17 0008     10/11/17 2145  ciprofloxacin (CIPRO) IVPB 400 mg     400 mg 200 mL/hr over 60 Minutes Intravenous  Once 10/11/17 2140 10/12/17 0038   10/11/17 2145  metroNIDAZOLE (FLAGYL) IVPB 500 mg     500 mg 100 mL/hr over 60 Minutes Intravenous  Once 10/11/17 2140 10/11/17 2318      Subjective: Reports continued sob this AM  Objective: Vitals:   10/13/17 1230 10/13/17 1248 10/13/17 1303 10/13/17 1504  BP: 121/74 126/75 97/61 (!) 85/60  Pulse:    (!) 104  Resp:    (!) 22  Temp:    97.8 F (36.6 C)  TempSrc:    Oral  SpO2:    97%  Weight:      Height:        Intake/Output Summary (  Last 24 hours) at 10/13/17 1528 Last data filed at 10/13/17 0522  Gross per 24 hour  Intake              860 ml  Output              200 ml  Net              660 ml   Filed Weights   10/11/17 2357 10/13/17 0509  Weight: 34.2 kg (75 lb 6.4 oz) 36.4 kg (80 lb 4.8 oz)    Examination: General exam: Awake, laying in bed, in nad Respiratory system: Increased resp effort, decreased BS throughout Cardiovascular system: regular rate, s1, s2 Gastrointestinal system: Soft, nondistended, positive BS Central nervous system: CN2-12 grossly intact, strength intact Extremities: Perfused, no clubbing Skin: Normal skin turgor, no  notable skin lesions seen Psychiatry: Mood normal // no visual hallucinations    Data Reviewed: I have personally reviewed following labs and imaging studies  CBC:  Recent Labs Lab 10/11/17 1625 10/11/17 1859 10/12/17 0546 10/13/17 0848  WBC 6.8  --  6.3 9.9  NEUTROABS  --   --   --  9.5*  HGB 13.4 11.6* 11.7* 14.2  HCT 40.6 34.0* 35.4* 42.4  MCV 95.5  --  93.4 90.4  PLT 293  --  260 269   Basic Metabolic Panel:  Recent Labs Lab 10/11/17 1625 10/11/17 1859 10/12/17 0546 10/13/17 0848  NA 137 134* 134* 128*  K 4.7 4.4 4.6 4.5  CL 96* 97* 97* 90*  CO2 30  --  31 27  GLUCOSE 93 93 90 165*  BUN 15 13 10 11   CREATININE 0.51 0.50 0.49 0.45  CALCIUM 9.1  --  8.5* 8.8*   GFR: Estimated Creatinine Clearance: 36 mL/min (by C-G formula based on SCr of 0.45 mg/dL). Liver Function Tests:  Recent Labs Lab 10/11/17 1625 10/12/17 0546 10/13/17 0848  AST 21 18 26   ALT 17 13* 18  ALKPHOS 116 90 121  BILITOT <0.1* 0.3 0.3  PROT 6.5 5.2* 6.8  ALBUMIN 3.3* 2.6* 3.4*    Recent Labs Lab 10/11/17 1625  LIPASE 34   No results for input(s): AMMONIA in the last 168 hours. Coagulation Profile: No results for input(s): INR, PROTIME in the last 168 hours. Cardiac Enzymes:  Recent Labs Lab 10/12/17 0027 10/12/17 0546 10/12/17 1141  TROPONINI <0.03 <0.03 <0.03   BNP (last 3 results)  Recent Labs  09/25/17 1527  PROBNP 87.0   HbA1C: No results for input(s): HGBA1C in the last 72 hours. CBG: No results for input(s): GLUCAP in the last 168 hours. Lipid Profile: No results for input(s): CHOL, HDL, LDLCALC, TRIG, CHOLHDL, LDLDIRECT in the last 72 hours. Thyroid Function Tests: No results for input(s): TSH, T4TOTAL, FREET4, T3FREE, THYROIDAB in the last 72 hours. Anemia Panel: No results for input(s): VITAMINB12, FOLATE, FERRITIN, TIBC, IRON, RETICCTPCT in the last 72 hours. Sepsis Labs:  Recent Labs Lab 10/11/17 1858  LATICACIDVEN 0.54    Recent Results  (from the past 240 hour(s))  Gastrointestinal Panel by PCR , Stool     Status: None   Collection Time: 10/11/17  4:25 PM  Result Value Ref Range Status   Campylobacter species NOT DETECTED NOT DETECTED Final   Plesimonas shigelloides NOT DETECTED NOT DETECTED Final   Salmonella species NOT DETECTED NOT DETECTED Final   Yersinia enterocolitica NOT DETECTED NOT DETECTED Final   Vibrio species NOT DETECTED NOT DETECTED Final   Vibrio cholerae NOT  DETECTED NOT DETECTED Final   Enteroaggregative E coli (EAEC) NOT DETECTED NOT DETECTED Final   Enteropathogenic E coli (EPEC) NOT DETECTED NOT DETECTED Final   Enterotoxigenic E coli (ETEC) NOT DETECTED NOT DETECTED Final   Shiga like toxin producing E coli (STEC) NOT DETECTED NOT DETECTED Final   Shigella/Enteroinvasive E coli (EIEC) NOT DETECTED NOT DETECTED Final   Cryptosporidium NOT DETECTED NOT DETECTED Final   Cyclospora cayetanensis NOT DETECTED NOT DETECTED Final   Entamoeba histolytica NOT DETECTED NOT DETECTED Final   Giardia lamblia NOT DETECTED NOT DETECTED Final   Adenovirus F40/41 NOT DETECTED NOT DETECTED Final   Astrovirus NOT DETECTED NOT DETECTED Final   Norovirus GI/GII NOT DETECTED NOT DETECTED Final   Rotavirus A NOT DETECTED NOT DETECTED Final   Sapovirus (I, II, IV, and V) NOT DETECTED NOT DETECTED Final     Radiology Studies: Dg Chest 1 View  Result Date: 10/13/2017 CLINICAL DATA:  Post right-sided thoracentesis EXAM: CHEST 1 VIEW COMPARISON:  10/11/2017; chest CT - 10/13/2017 FINDINGS: Grossly unchanged cardiac silhouette and mediastinal contours. Interval reduction/resolution of right-sided pleural effusion post thoracentesis. No pneumothorax. Unchanged cardiac silhouette and mediastinal contours with atherosclerotic plaque within thoracic aorta. Unchanged appearance of esophageal stent. Suspected increase in size of now small to moderate sized left-sided effusion. Improved aeration of the right lung base. Persistent  left basilar heterogeneous/consolidative opacities, likely atelectasis. No acute osseus abnormalities. IMPRESSION: 1. Interval reduction/resolution of right-sided effusion post thoracentesis. No pneumothorax. 2. Suspected increase now small to moderate size left-sided effusion with worsening left basilar opacities, likely atelectasis. Electronically Signed   By: Sandi Mariscal M.D.   On: 10/13/2017 13:58   Dg Chest 2 View  Result Date: 10/11/2017 CLINICAL DATA:  76 year old female with diarrhea and bloody stools. Cough. Esophageal stent. Breast cancer. Former smoker. EXAM: CHEST  2 VIEW COMPARISON:  Chest radiographs 09/25/2017 and earlier. FINDINGS: Semi upright AP and lateral views of the chest. Stable mid and lower thoracic esophageal stent. Increased small bilateral pleural effusions since September greater on the right. No superimposed pneumothorax or pulmonary edema. Widespread emphysema. Stable cardiac size and mediastinal contours. Osteopenia. Stable visualized osseous structures. Paucity of bowel gas in the upper abdomen. IMPRESSION: 1. Increasing small bilateral pleural effusions since September. 2. No new cardiopulmonary abnormality. 3. Stable esophageal stent.  Emphysema (ICD10-J43.9). Electronically Signed   By: Genevie Ann M.D.   On: 10/11/2017 19:20   Ct Chest W Contrast  Result Date: 10/13/2017 CLINICAL DATA:  Inpatient. Metastatic esophageal cancer. Enlarging right middle lobe lung mass on CT abdomen study. EXAM: CT CHEST WITH CONTRAST TECHNIQUE: Multidetector CT imaging of the chest was performed during intravenous contrast administration. CONTRAST:  23mL ISOVUE-300 IOPAMIDOL (ISOVUE-300) INJECTION 61% COMPARISON:  10/11/2017 CT abdomen/ pelvis. 10/11/2017 chest radiograph. 08/26/2017 chest CT. FINDINGS: Cardiovascular: Normal heart size. No significant pericardial fluid/thickening. Left anterior descending, left circumflex and right coronary atherosclerosis. Atherosclerotic nonaneurysmal  thoracic aorta. Normal caliber pulmonary arteries. No central pulmonary emboli. Mediastinum/Nodes: No discrete thyroid nodules. Esophageal stent centered in the mid to lower thoracic esophagus is stable in position and without stenosis or discontinuity. No discrete esophageal wall thickening or mass. No axillary adenopathy. Right paratracheal adenopathy measuring up to 1.7 cm (series 2/ image 54), increased from 1.1 cm on 08/26/2017. Newly mildly enlarged 1.0 cm right hilar node (series 2/image 70). Lungs/Pleura: No pneumothorax. Moderate dependent pleural effusions bilaterally. Moderate to severe centrilobular emphysema with mild diffuse bronchial wall thickening. Medial right middle lobe 3.4 x 2.4 cm lung  mass (series 8/ image 110), increased from 2.5 x 1.8 cm on 08/26/2017. Four new scattered solid pulmonary nodules in the right lung measuring up to the 1.2 cm in the posterior right lower lobe (series 8/ image 87). Mild-to-moderate compressive atelectasis in the dependent lower lobes. Upper abdomen: Percutaneous gastrostomy tube is in place in the proximal stomach. Simple left renal cysts, largest 2.1 cm in the interpolar left kidney. Hyperenhancing 0.7 cm posterior right liver lobe focus (series 2/ image 142), not apparent on 10/11/2017 CT abdomen study, increased from 0.4 cm on 08/26/2017 chest CT. Musculoskeletal: No aggressive appearing focal osseous lesions. Mild superior T6 and T7 vertebral compression fractures appear unchanged. Mild thoracic spondylosis. IMPRESSION: 1. Medial right middle lobe lung mass is increased in size since 08/26/2017 chest CT. Four new scattered solid pulmonary nodules in the right lung. Findings are compatible with progressive pulmonary metastatic disease. 2. Increased mediastinal and right hilar lymphadenopathy, compatible with nodal metastatic disease. 3. Indeterminate subcentimeter hyperenhancing posterior right liver lobe focus, apparently increased from 08/26/2017 chest CT,  not apparent on 10/11/2017 CT abdomen study. Differential includes a liver metastasis or transient benign perfusional anomaly. If clinically relevant, further characterization with MRI abdomen without and with IV contrast could be performed. 4. Stable well-positioned patent mid to lower thoracic esophageal stent with no evidence of local tumor recurrence. 5. Moderate dependent bilateral pleural effusions with associated dependent bilateral lower lobe atelectasis. 6. Three-vessel coronary atherosclerosis. Aortic Atherosclerosis (ICD10-I70.0) and Emphysema (ICD10-J43.9). Electronically Signed   By: Ilona Sorrel M.D.   On: 10/13/2017 09:08   Ct Abdomen Pelvis W Contrast  Result Date: 10/11/2017 CLINICAL DATA:  76 y/o  F; abdominal pain.  Blood in stool. EXAM: CT ABDOMEN AND PELVIS WITH CONTRAST TECHNIQUE: Multidetector CT imaging of the abdomen and pelvis was performed using the standard protocol following bolus administration of intravenous contrast. CONTRAST:  60mL ISOVUE-300 IOPAMIDOL (ISOVUE-300) INJECTION 61%, 25mL ISOVUE-300 IOPAMIDOL (ISOVUE-300) INJECTION 61% COMPARISON:  10/29/2016 CT abdomen and pelvis. 08/19/2017 lumbar spine radiographs. FINDINGS: Lower chest: Moderate right and small left pleural effusions. Partially visualized stent within the distal esophagus. Partially visualize heterogeneous lesion within medial aspect of right middle lobe is increased in size measuring up to 4.4 cm (series 2, image 1). Hepatobiliary: Stable low-attenuation lesions within the liver at the segment 6/7 junction (series 2, image 24) with equilibration on the venous phase, probably hemangioma. No additional liver lesion identified. Normal gallbladder. No intra or extrahepatic biliary ductal dilatation. Pancreas: Unremarkable. No pancreatic ductal dilatation or surrounding inflammatory changes. Spleen: Normal in size without focal abnormality. Adrenals/Urinary Tract: Normal adrenal glands. Bilateral kidney lower pole  homogeneous fluid attenuating well-circumscribed lesions the largest on the right measuring up to 2.9 cm compatible with a cyst. No hydronephrosis. Normal bladder peer Stomach/Bowel: Mild wall thickening and surrounding inflammatory changes involving the sigmoid colon and rectum compatible with acute colitis. No findings for perforation or abscess. Small bowel is unremarkable. Percutaneous gastrostomy tube balloon in the body of stomach. Vascular/Lymphatic: Aortic atherosclerosis. No enlarged abdominal or pelvic lymph nodes. Reproductive: Uterus and bilateral adnexa are unremarkable. Other: No abdominal wall hernia or abnormality. No abdominopelvic ascites. Musculoskeletal: New L4 vertebral body superior endplate compression deformity with mild loss of vertebral body height. IMPRESSION: 1. Mild wall thickening and surrounding edema of sigmoid colon and rectum compatible with acute colitis. No perforation or abscess. 2. Increased size of mass within the medial aspect of right middle lobe with heterogeneous enhancement suspicious for metastasis. 3. Moderate right and small left  pleural effusions. 4. New mild L4 vertebral body compression deformity from 08/19/2017. Electronically Signed   By: Kristine Garbe M.D.   On: 10/11/2017 21:29   US Thoracentesis Asp Pleural Space W/img Guide  Result Date: 10/13/2017 INDICATION: Metastatic esophageal carcinoma, dyspnea, bilateral pleural effusions, right greater than left. Request made for diagnostic and therapeutic right thoracentesis. EXAM: ULTRASOUND GUIDED DIAGNOSTIC AND THERAPEUTIC RIGHT THORACENTESIS MEDICATIONS: None. COMPLICATIONS: None immediate. PROCEDURE: An ultrasound guided thoracentesis was thoroughly discussed with the patient and questions answered. The benefits, risks, alternatives and complications were also discussed. The patient understands and wishes to proceed with the procedure. Written consent was obtained. Ultrasound was performed to  localize and mark an adequate pocket of fluid in the right chest. The area was then prepped and draped in the normal sterile fashion. 2% Lidocaine was used for local anesthesia. Under ultrasound guidance a Safe-T-Centesis catheter was introduced. Thoracentesis was performed. The catheter was removed and a dressing applied. FINDINGS: A total of approximately 1 liter of yellow fluid was removed. Samples were sent to the laboratory as requested by the clinical team. IMPRESSION: Successful ultrasound guided diagnostic and therapeutic right thoracentesis yielding 1 liter of pleural fluid. Read by: Rowe Robert, PA-C Electronically Signed   By: Sandi Mariscal M.D.   On: 10/13/2017 13:16    Scheduled Meds: . feeding supplement (OSMOLITE 1.5 CAL)  237 mL Per Tube QID  . iopamidol      . ipratropium-albuterol  3 mL Nebulization QID  . lidocaine      . methylPREDNISolone (SOLU-MEDROL) injection  40 mg Intravenous Q12H  . mirtazapine  15 mg Oral QHS  . pantoprazole  40 mg Oral Daily  . potassium chloride  10 mEq Oral Daily  . rOPINIRole  0.25 mg Oral QHS  . tiotropium  18 mcg Inhalation Daily   Continuous Infusions: . levofloxacin (LEVAQUIN) IV Stopped (10/13/17 1033)  . metronidazole Stopped (10/13/17 1504)     LOS: 2 days   CHIU, Orpah Melter, MD [facility] Pager [phone]  If 7PM-7AM, please contact night-coverage [email or web address] Password [email or web address] 10/13/2017, 3:28 PM

## 2017-10-13 NOTE — Progress Notes (Signed)
Karen Osborn 1:09 PM  Subjective: Patient seen in radiology after her thoracentesis which has helped and that was the first time she's had that done and her case discussed with her husband as well and she's had no signs of further bleeding and has no new complaints  Objective: Vital signs stable afebrile no acute distress looking good after  Thoracentesis hemoglobin okay doubt its normal chest CT reviewed  Assessment: Metastatic cancer with resolved GI bleeding  Plan: Will check on tomorrow but no plans for flexible sigmoidoscopy and please call me if I could be of any further assistance with this hospital stay and hopefully after thoracentesis she will feel like eating something  [facility] E  Pager [phone] After 5PM or if no answer call [phone]

## 2017-10-13 NOTE — Progress Notes (Signed)
IP PROGRESS NOTE  Subjective:   Karen Osborn was admitted 10/11/2017 with bloody diarrhea.she was diagnosed with diverticulitis. A CT of the abdomen/pelvis revealed mild wall thickening and edema at the sigmoid colon. An increased mass was noted in the right middle lobe with moderate right and small left pleural effusions. A CT of the chest today revealed a right middle lobe mass, increased from 08/26/2017, new solid pulmonary nodules in the right lung, mediastinal/right hilar lymphadenopathy,indeterminate right liver lesion,Ann bilateral pleural effusions.  She complains of dyspnea.  Objective: Vital signs in last 24 hours: Blood pressure 97/61, pulse 86, temperature (!) 97.4 F (36.3 C), temperature source Oral, resp. rate 20, height 5' (1.524 m), weight 80 lb 4.8 oz (36.4 kg), SpO2 93 %.  Intake/Output from previous day: 10/14 0701 - 10/15 0700 In: 1480 [P.O.:600; NG/GT:360; IV Piggyback:400] Out: 900 [Urine:600; Stool:300]  Physical Exam:  HEENT: neck without mass Lungs: distant breath sounds Cardiac: regular rate and rhythm Abdomen: no hepatosplenomegaly, left upper quadrant feeding tube Extremities: no leg edema   Portacath/PICC-without erythema  Lab Results:  Recent Labs  10/12/17 0546 10/13/17 0848  WBC 6.3 9.9  HGB 11.7* 14.2  HCT 35.4* 42.4  PLT 260 363    BMET  Recent Labs  10/12/17 0546 10/13/17 0848  NA 134* 128*  K 4.6 4.5  CL 97* 90*  CO2 31 27  GLUCOSE 90 165*  BUN 10 11  CREATININE 0.49 0.45  CALCIUM 8.5* 8.8*    No results found for: CEA1  Studies/Results: Dg Chest 1 View  Result Date: 10/13/2017 CLINICAL DATA:  Post right-sided thoracentesis EXAM: CHEST 1 VIEW COMPARISON:  10/11/2017; chest CT - 10/13/2017 FINDINGS: Grossly unchanged cardiac silhouette and mediastinal contours. Interval reduction/resolution of right-sided pleural effusion post thoracentesis. No pneumothorax. Unchanged cardiac silhouette and mediastinal contours with  atherosclerotic plaque within thoracic aorta. Unchanged appearance of esophageal stent. Suspected increase in size of now small to moderate sized left-sided effusion. Improved aeration of the right lung base. Persistent left basilar heterogeneous/consolidative opacities, likely atelectasis. No acute osseus abnormalities. IMPRESSION: 1. Interval reduction/resolution of right-sided effusion post thoracentesis. No pneumothorax. 2. Suspected increase now small to moderate size left-sided effusion with worsening left basilar opacities, likely atelectasis. Electronically Signed   By: Sandi Mariscal M.D.   On: 10/13/2017 13:58   Dg Chest 2 View  Result Date: 10/11/2017 CLINICAL DATA:  76 year old female with diarrhea and bloody stools. Cough. Esophageal stent. Breast cancer. Former smoker. EXAM: CHEST  2 VIEW COMPARISON:  Chest radiographs 09/25/2017 and earlier. FINDINGS: Semi upright AP and lateral views of the chest. Stable mid and lower thoracic esophageal stent. Increased small bilateral pleural effusions since September greater on the right. No superimposed pneumothorax or pulmonary edema. Widespread emphysema. Stable cardiac size and mediastinal contours. Osteopenia. Stable visualized osseous structures. Paucity of bowel gas in the upper abdomen. IMPRESSION: 1. Increasing small bilateral pleural effusions since September. 2. No new cardiopulmonary abnormality. 3. Stable esophageal stent.  Emphysema (ICD10-J43.9). Electronically Signed   By: Genevie Ann M.D.   On: 10/11/2017 19:20   Ct Chest W Contrast  Result Date: 10/13/2017 CLINICAL DATA:  Inpatient. Metastatic esophageal cancer. Enlarging right middle lobe lung mass on CT abdomen study. EXAM: CT CHEST WITH CONTRAST TECHNIQUE: Multidetector CT imaging of the chest was performed during intravenous contrast administration. CONTRAST:  71mL ISOVUE-300 IOPAMIDOL (ISOVUE-300) INJECTION 61% COMPARISON:  10/11/2017 CT abdomen/ pelvis. 10/11/2017 chest radiograph.  08/26/2017 chest CT. FINDINGS: Cardiovascular: Normal heart size. No significant pericardial fluid/thickening. Left  anterior descending, left circumflex and right coronary atherosclerosis. Atherosclerotic nonaneurysmal thoracic aorta. Normal caliber pulmonary arteries. No central pulmonary emboli. Mediastinum/Nodes: No discrete thyroid nodules. Esophageal stent centered in the mid to lower thoracic esophagus is stable in position and without stenosis or discontinuity. No discrete esophageal wall thickening or mass. No axillary adenopathy. Right paratracheal adenopathy measuring up to 1.7 cm (series 2/ image 54), increased from 1.1 cm on 08/26/2017. Newly mildly enlarged 1.0 cm right hilar node (series 2/image 70). Lungs/Pleura: No pneumothorax. Moderate dependent pleural effusions bilaterally. Moderate to severe centrilobular emphysema with mild diffuse bronchial wall thickening. Medial right middle lobe 3.4 x 2.4 cm lung mass (series 8/ image 110), increased from 2.5 x 1.8 cm on 08/26/2017. Four new scattered solid pulmonary nodules in the right lung measuring up to the 1.2 cm in the posterior right lower lobe (series 8/ image 87). Mild-to-moderate compressive atelectasis in the dependent lower lobes. Upper abdomen: Percutaneous gastrostomy tube is in place in the proximal stomach. Simple left renal cysts, largest 2.1 cm in the interpolar left kidney. Hyperenhancing 0.7 cm posterior right liver lobe focus (series 2/ image 142), not apparent on 10/11/2017 CT abdomen study, increased from 0.4 cm on 08/26/2017 chest CT. Musculoskeletal: No aggressive appearing focal osseous lesions. Mild superior T6 and T7 vertebral compression fractures appear unchanged. Mild thoracic spondylosis. IMPRESSION: 1. Medial right middle lobe lung mass is increased in size since 08/26/2017 chest CT. Four new scattered solid pulmonary nodules in the right lung. Findings are compatible with progressive pulmonary metastatic disease. 2.  Increased mediastinal and right hilar lymphadenopathy, compatible with nodal metastatic disease. 3. Indeterminate subcentimeter hyperenhancing posterior right liver lobe focus, apparently increased from 08/26/2017 chest CT, not apparent on 10/11/2017 CT abdomen study. Differential includes a liver metastasis or transient benign perfusional anomaly. If clinically relevant, further characterization with MRI abdomen without and with IV contrast could be performed. 4. Stable well-positioned patent mid to lower thoracic esophageal stent with no evidence of local tumor recurrence. 5. Moderate dependent bilateral pleural effusions with associated dependent bilateral lower lobe atelectasis. 6. Three-vessel coronary atherosclerosis. Aortic Atherosclerosis (ICD10-I70.0) and Emphysema (ICD10-J43.9). Electronically Signed   By: Ilona Sorrel M.D.   On: 10/13/2017 09:08   Ct Abdomen Pelvis W Contrast  Result Date: 10/11/2017 CLINICAL DATA:  76 y/o  F; abdominal pain.  Blood in stool. EXAM: CT ABDOMEN AND PELVIS WITH CONTRAST TECHNIQUE: Multidetector CT imaging of the abdomen and pelvis was performed using the standard protocol following bolus administration of intravenous contrast. CONTRAST:  42mL ISOVUE-300 IOPAMIDOL (ISOVUE-300) INJECTION 61%, 35mL ISOVUE-300 IOPAMIDOL (ISOVUE-300) INJECTION 61% COMPARISON:  10/29/2016 CT abdomen and pelvis. 08/19/2017 lumbar spine radiographs. FINDINGS: Lower chest: Moderate right and small left pleural effusions. Partially visualized stent within the distal esophagus. Partially visualize heterogeneous lesion within medial aspect of right middle lobe is increased in size measuring up to 4.4 cm (series 2, image 1). Hepatobiliary: Stable low-attenuation lesions within the liver at the segment 6/7 junction (series 2, image 24) with equilibration on the venous phase, probably hemangioma. No additional liver lesion identified. Normal gallbladder. No intra or extrahepatic biliary ductal  dilatation. Pancreas: Unremarkable. No pancreatic ductal dilatation or surrounding inflammatory changes. Spleen: Normal in size without focal abnormality. Adrenals/Urinary Tract: Normal adrenal glands. Bilateral kidney lower pole homogeneous fluid attenuating well-circumscribed lesions the largest on the right measuring up to 2.9 cm compatible with a cyst. No hydronephrosis. Normal bladder peer Stomach/Bowel: Mild wall thickening and surrounding inflammatory changes involving the sigmoid colon and rectum compatible  with acute colitis. No findings for perforation or abscess. Small bowel is unremarkable. Percutaneous gastrostomy tube balloon in the body of stomach. Vascular/Lymphatic: Aortic atherosclerosis. No enlarged abdominal or pelvic lymph nodes. Reproductive: Uterus and bilateral adnexa are unremarkable. Other: No abdominal wall hernia or abnormality. No abdominopelvic ascites. Musculoskeletal: New L4 vertebral body superior endplate compression deformity with mild loss of vertebral body height. IMPRESSION: 1. Mild wall thickening and surrounding edema of sigmoid colon and rectum compatible with acute colitis. No perforation or abscess. 2. Increased size of mass within the medial aspect of right middle lobe with heterogeneous enhancement suspicious for metastasis. 3. Moderate right and small left pleural effusions. 4. New mild L4 vertebral body compression deformity from 08/19/2017. Electronically Signed   By: Kristine Garbe M.D.   On: 10/11/2017 21:29   US Thoracentesis Asp Pleural Space W/img Guide  Result Date: 10/13/2017 INDICATION: Metastatic esophageal carcinoma, dyspnea, bilateral pleural effusions, right greater than left. Request made for diagnostic and therapeutic right thoracentesis. EXAM: ULTRASOUND GUIDED DIAGNOSTIC AND THERAPEUTIC RIGHT THORACENTESIS MEDICATIONS: None. COMPLICATIONS: None immediate. PROCEDURE: An ultrasound guided thoracentesis was thoroughly discussed with the  patient and questions answered. The benefits, risks, alternatives and complications were also discussed. The patient understands and wishes to proceed with the procedure. Written consent was obtained. Ultrasound was performed to localize and mark an adequate pocket of fluid in the right chest. The area was then prepped and draped in the normal sterile fashion. 2% Lidocaine was used for local anesthesia. Under ultrasound guidance a Safe-T-Centesis catheter was introduced. Thoracentesis was performed. The catheter was removed and a dressing applied. FINDINGS: A total of approximately 1 liter of yellow fluid was removed. Samples were sent to the laboratory as requested by the clinical team. IMPRESSION: Successful ultrasound guided diagnostic and therapeutic right thoracentesis yielding 1 liter of pleural fluid. Read by: Rowe Robert, PA-C Electronically Signed   By: Sandi Mariscal M.D.   On: 10/13/2017 13:16    Medications: I have reviewed the patient's current medications.  Assessment/Plan:  1.Squamous cell carcinoma the midesophagus  Staging CT scans 10/29/2016 with no evidence of metastatic disease; nonspecific liver lesions, right hydronephrosis, retroperitoneal lymph nodes, and sclerotic right seventh rib lesion  Staging PET scan 11/13/2016-low left jugular/supraclavicular node measuring 5 mm and SUV max of 2.6; left paratracheal node measuring8 mm and SUV max 5.8; periaorticnodes including a 6 mm node SUV max 4.4; right retrocrural hypermetabolic node measures 8 mm and SUV max of 9.4;hypermetabolism corresponding to the mid esophageal primary SUV max 15.6; no right hepatic lobe hypermetabolism; no hypermetabolism to correspond to the retroperitoneal mild adenopathy on prior exam.  Initiation of radiation 11/25/2016 and concurrent weekly Taxol/carboplatin 11/26/2016; week 5 Taxol/carboplatin 12/31/2016; radiation completed 01/03/2017  Esophagram03/07/2017-smooth narrowing of the thoracic  esophagus  Upper endoscopy 05/05/2017-benign appearing esophageal stenosis, dilated. Acute gastritis.  Upper endoscopy 06/12/2017-benign appearing esophageal stenosis. Dilated. Acute gastritis.  Esophagus stent placement 08/19/2017  CT chest 10/13/2017-right middle lobe mass, pulmonary nodules, bilateral pleural effusions,mediastinal/hilar adenopathy  2. COPD  3. 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 Right hydronephrosis on the CT 10/29/2016  5. Dysphagia/weight loss secondary to #1  Placement of an esophagus stent 08/19/2017  6. Rash upper back 12/10/2016. Question etiology.  7. Feeding tube placement 12/26/2016  8. Diarrhea, failure to thrive 02/12/2017. C. difficile negative 02/18/2017. Improved 02/20/2017.  9. Acute mid back pain-04/21/2017; x-ray thoracic spine with minimal upper thoracic vertebral body compression  fracture.  10. Herpes zoster left upper lateral back. 7 day course of Valtrex prescribed.  11. Admission 10/11/2017 with rectal bleeding, probably diverticulitis  Karen Osborn was admitted with bloody diarrhea. A CT evaluation reveals evidence of metastatic disease involving lung nodules, chest lymph nodes, and pleural effusions.the differential diagnosis includes metastatic esophagus cancer and a new primary lung tumor.  We will  consider additional options for establishing a diagnosis if a thoracentesis is nondiagnostic. She MAY BE A CANDIDATE FOR BRONCHOSCOPY/EBUS.   Recommendations: 1. Continue treatment of the diarrhea per medicine/gastroenterology 2. Diagnostic/therapeutic right thoracentesis 3. Follow-up pleural fluid cytology and consider bronchoscopy if the pleural fluid is nondiagnostic        LOS: 2 days   Donneta Romberg, MD   10/13/2017, 2:02 PM

## 2017-10-13 NOTE — Plan of Care (Signed)
Problem: Safety: Goal: Ability to remain free from injury will improve Outcome: Progressing Pt calls appropriately for assistance.   Does not attempt to get up without calling. Will continue to monitor and continue with current plan of care.

## 2017-10-14 ENCOUNTER — Inpatient Hospital Stay (HOSPITAL_COMMUNITY): Payer: Medicare Other

## 2017-10-14 DIAGNOSIS — E43 Unspecified severe protein-calorie malnutrition: Secondary | ICD-10-CM | POA: Insufficient documentation

## 2017-10-14 LAB — COMPREHENSIVE METABOLIC PANEL
ALT: 17 U/L (ref 14–54)
ANION GAP: 6 (ref 5–15)
AST: 21 U/L (ref 15–41)
Albumin: 2.7 g/dL — ABNORMAL LOW (ref 3.5–5.0)
Alkaline Phosphatase: 88 U/L (ref 38–126)
BILIRUBIN TOTAL: 0.3 mg/dL (ref 0.3–1.2)
BUN: 14 mg/dL (ref 6–20)
CO2: 31 mmol/L (ref 22–32)
Calcium: 8.7 mg/dL — ABNORMAL LOW (ref 8.9–10.3)
Chloride: 95 mmol/L — ABNORMAL LOW (ref 101–111)
Creatinine, Ser: 0.42 mg/dL — ABNORMAL LOW (ref 0.44–1.00)
GFR calc Af Amer: >60 mL/min (ref >60)
Glucose, Bld: 113 mg/dL — ABNORMAL HIGH (ref 65–99)
POTASSIUM: 4.8 mmol/L (ref 3.5–5.1)
Sodium: 132 mmol/L — ABNORMAL LOW (ref 135–145)
TOTAL PROTEIN: 5.2 g/dL — AB (ref 6.5–8.1)

## 2017-10-14 LAB — CBC
HEMATOCRIT: 38.3 % (ref 36.0–46.0)
HEMOGLOBIN: 12.7 g/dL (ref 12.0–15.0)
MCH: 30.2 pg (ref 26.0–34.0)
MCHC: 33.2 g/dL (ref 30.0–36.0)
MCV: 91.2 fL (ref 78.0–100.0)
Platelets: 284 10*3/uL (ref 150–400)
RBC: 4.2 MIL/uL (ref 3.87–5.11)
RDW: 12.7 % (ref 11.5–15.5)
WBC: 8.4 10*3/uL (ref 4.0–10.5)

## 2017-10-14 MED ORDER — ALBUTEROL SULFATE (2.5 MG/3ML) 0.083% IN NEBU
2.5000 mg | INHALATION_SOLUTION | Freq: Four times a day (QID) | RESPIRATORY_TRACT | Status: DC
  Administered 2017-10-14 – 2017-10-16 (×9): 2.5 mg via RESPIRATORY_TRACT
  Filled 2017-10-14 (×10): qty 3

## 2017-10-14 MED ORDER — FUROSEMIDE 10 MG/ML IJ SOLN
40.0000 mg | Freq: Once | INTRAMUSCULAR | Status: AC
  Administered 2017-10-14: 40 mg via INTRAVENOUS
  Filled 2017-10-14: qty 4

## 2017-10-14 MED ORDER — SUCRALFATE 1 GM/10ML PO SUSP
1.0000 g | Freq: Four times a day (QID) | ORAL | Status: DC
  Administered 2017-10-14 – 2017-10-16 (×9): 1 g via ORAL
  Filled 2017-10-14 (×9): qty 10

## 2017-10-14 MED ORDER — SACCHAROMYCES BOULARDII 250 MG PO CAPS
250.0000 mg | ORAL_CAPSULE | Freq: Two times a day (BID) | ORAL | Status: DC
  Administered 2017-10-14 – 2017-10-16 (×5): 250 mg via ORAL
  Filled 2017-10-14 (×5): qty 1

## 2017-10-14 NOTE — Progress Notes (Signed)
IP PROGRESS NOTE  Subjective:   Karen Osborn underwent a right thoracentesis yesterday. She reports improvement in dyspnea following this procedure.  She has mild dyspnea today.She continues to have diarrhea.  Objective: Vital signs in last 24 hours: Blood pressure 111/69, pulse (!) 116, temperature 98.2 F (36.8 C), temperature source Oral, resp. rate 17, height 5' (1.524 m), weight 73 lb 10.1 oz (33.4 kg), SpO2 97 %.  Intake/Output from previous day: 10/15 0701 - 10/16 0700 In: 670 [NG/GT:220; IV Piggyback:450] Out: 200 [Urine:200]  Physical Exam:   Lungs: distant breath sounds Cardiac: regular rate and rhythm Abdomen: no hepatosplenomegaly, left upper quadrant feeding tube Extremities: no leg edema   Portacath/PICC-without erythema  Lab Results:  Recent Labs  10/13/17 0848 10/14/17 0507  WBC 9.9 8.4  HGB 14.2 12.7  HCT 42.4 38.3  PLT 363 284    BMET  Recent Labs  10/13/17 0848 10/14/17 0507  NA 128* 132*  K 4.5 4.8  CL 90* 95*  CO2 27 31  GLUCOSE 165* 113*  BUN 11 14  CREATININE 0.45 0.42*  CALCIUM 8.8* 8.7*    No results found for: CEA1  Studies/Results: Dg Chest 1 View  Result Date: 10/13/2017 CLINICAL DATA:  Post right-sided thoracentesis EXAM: CHEST 1 VIEW COMPARISON:  10/11/2017; chest CT - 10/13/2017 FINDINGS: Grossly unchanged cardiac silhouette and mediastinal contours. Interval reduction/resolution of right-sided pleural effusion post thoracentesis. No pneumothorax. Unchanged cardiac silhouette and mediastinal contours with atherosclerotic plaque within thoracic aorta. Unchanged appearance of esophageal stent. Suspected increase in size of now small to moderate sized left-sided effusion. Improved aeration of the right lung base. Persistent left basilar heterogeneous/consolidative opacities, likely atelectasis. No acute osseus abnormalities. IMPRESSION: 1. Interval reduction/resolution of right-sided effusion post thoracentesis. No  pneumothorax. 2. Suspected increase now small to moderate size left-sided effusion with worsening left basilar opacities, likely atelectasis. Electronically Signed   By: Sandi Mariscal M.D.   On: 10/13/2017 13:58   Ct Chest W Contrast  Result Date: 10/13/2017 CLINICAL DATA:  Inpatient. Metastatic esophageal cancer. Enlarging right middle lobe lung mass on CT abdomen study. EXAM: CT CHEST WITH CONTRAST TECHNIQUE: Multidetector CT imaging of the chest was performed during intravenous contrast administration. CONTRAST:  74mL ISOVUE-300 IOPAMIDOL (ISOVUE-300) INJECTION 61% COMPARISON:  10/11/2017 CT abdomen/ pelvis. 10/11/2017 chest radiograph. 08/26/2017 chest CT. FINDINGS: Cardiovascular: Normal heart size. No significant pericardial fluid/thickening. Left anterior descending, left circumflex and right coronary atherosclerosis. Atherosclerotic nonaneurysmal thoracic aorta. Normal caliber pulmonary arteries. No central pulmonary emboli. Mediastinum/Nodes: No discrete thyroid nodules. Esophageal stent centered in the mid to lower thoracic esophagus is stable in position and without stenosis or discontinuity. No discrete esophageal wall thickening or mass. No axillary adenopathy. Right paratracheal adenopathy measuring up to 1.7 cm (series 2/ image 54), increased from 1.1 cm on 08/26/2017. Newly mildly enlarged 1.0 cm right hilar node (series 2/image 70). Lungs/Pleura: No pneumothorax. Moderate dependent pleural effusions bilaterally. Moderate to severe centrilobular emphysema with mild diffuse bronchial wall thickening. Medial right middle lobe 3.4 x 2.4 cm lung mass (series 8/ image 110), increased from 2.5 x 1.8 cm on 08/26/2017. Four new scattered solid pulmonary nodules in the right lung measuring up to the 1.2 cm in the posterior right lower lobe (series 8/ image 87). Mild-to-moderate compressive atelectasis in the dependent lower lobes. Upper abdomen: Percutaneous gastrostomy tube is in place in the proximal  stomach. Simple left renal cysts, largest 2.1 cm in the interpolar left kidney. Hyperenhancing 0.7 cm posterior right liver lobe focus (series  2/ image 142), not apparent on 10/11/2017 CT abdomen study, increased from 0.4 cm on 08/26/2017 chest CT. Musculoskeletal: No aggressive appearing focal osseous lesions. Mild superior T6 and T7 vertebral compression fractures appear unchanged. Mild thoracic spondylosis. IMPRESSION: 1. Medial right middle lobe lung mass is increased in size since 08/26/2017 chest CT. Four new scattered solid pulmonary nodules in the right lung. Findings are compatible with progressive pulmonary metastatic disease. 2. Increased mediastinal and right hilar lymphadenopathy, compatible with nodal metastatic disease. 3. Indeterminate subcentimeter hyperenhancing posterior right liver lobe focus, apparently increased from 08/26/2017 chest CT, not apparent on 10/11/2017 CT abdomen study. Differential includes a liver metastasis or transient benign perfusional anomaly. If clinically relevant, further characterization with MRI abdomen without and with IV contrast could be performed. 4. Stable well-positioned patent mid to lower thoracic esophageal stent with no evidence of local tumor recurrence. 5. Moderate dependent bilateral pleural effusions with associated dependent bilateral lower lobe atelectasis. 6. Three-vessel coronary atherosclerosis. Aortic Atherosclerosis (ICD10-I70.0) and Emphysema (ICD10-J43.9). Electronically Signed   By: Ilona Sorrel M.D.   On: 10/13/2017 09:08   US Thoracentesis Asp Pleural Space W/img Guide  Result Date: 10/13/2017 INDICATION: Metastatic esophageal carcinoma, dyspnea, bilateral pleural effusions, right greater than left. Request made for diagnostic and therapeutic right thoracentesis. EXAM: ULTRASOUND GUIDED DIAGNOSTIC AND THERAPEUTIC RIGHT THORACENTESIS MEDICATIONS: None. COMPLICATIONS: None immediate. PROCEDURE: An ultrasound guided thoracentesis was  thoroughly discussed with the patient and questions answered. The benefits, risks, alternatives and complications were also discussed. The patient understands and wishes to proceed with the procedure. Written consent was obtained. Ultrasound was performed to localize and mark an adequate pocket of fluid in the right chest. The area was then prepped and draped in the normal sterile fashion. 2% Lidocaine was used for local anesthesia. Under ultrasound guidance a Safe-T-Centesis catheter was introduced. Thoracentesis was performed. The catheter was removed and a dressing applied. FINDINGS: A total of approximately 1 liter of yellow fluid was removed. Samples were sent to the laboratory as requested by the clinical team. IMPRESSION: Successful ultrasound guided diagnostic and therapeutic right thoracentesis yielding 1 liter of pleural fluid. Read by: Rowe Robert, PA-C Electronically Signed   By: Sandi Mariscal M.D.   On: 10/13/2017 13:16    Medications: I have reviewed the patient's current medications.  Assessment/Plan:  1.Squamous cell carcinoma the midesophagus  Staging CT scans 10/29/2016 with no evidence of metastatic disease; nonspecific liver lesions, right hydronephrosis, retroperitoneal lymph nodes, and sclerotic right seventh rib lesion  Staging PET scan 11/13/2016-low left jugular/supraclavicular node measuring 5 mm and SUV max of 2.6; left paratracheal node measuring8 mm and SUV max 5.8; periaorticnodes including a 6 mm node SUV max 4.4; right retrocrural hypermetabolic node measures 8 mm and SUV max of 9.4;hypermetabolism corresponding to the mid esophageal primary SUV max 15.6; no right hepatic lobe hypermetabolism; no hypermetabolism to correspond to the retroperitoneal mild adenopathy on prior exam.  Initiation of radiation 11/25/2016 and concurrent weekly Taxol/carboplatin 11/26/2016; week 5 Taxol/carboplatin 12/31/2016; radiation completed 01/03/2017  Esophagram03/07/2017-smooth  narrowing of the thoracic esophagus  Upper endoscopy 05/05/2017-benign appearing esophageal stenosis, dilated. Acute gastritis.  Upper endoscopy 06/12/2017-benign appearing esophageal stenosis. Dilated. Acute gastritis.  Esophagus stent placement 08/19/2017  CT chest 10/13/2017-right middle lobe mass, pulmonary nodules, bilateral pleural effusions,mediastinal/hilar adenopathy  Right thoracentesis 10/13/2017-cytology pending  2. COPD  3. 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 Right hydronephrosis on the CT 10/29/2016  5. Dysphagia/weight  loss secondary to #1  Placement of an esophagus stent 08/19/2017  6. Rash upper back 12/10/2016. Question etiology.  7. Feeding tube placement 12/26/2016  8. Diarrhea, failure to thrive 02/12/2017. C. difficile negative 02/18/2017. Improved 02/20/2017.  9. Acute mid back pain-04/21/2017; x-ray thoracic spine with minimal upper thoracic vertebral body compression fracture.  10. Herpes zoster left upper lateral back. 7 day course of Valtrex prescribed.  11. Admission 10/11/2017 with rectal bleeding, probably diverticulitis  Ms. Walby obtained partial relief of dyspnea with the thoracentesis chest her today.I discussed the chest CT findings with her. She understands the chest mass/nodules and adenopathy likely reflect a malignancy. This may be progressive esophagus cancer or potentially metastatic disease from a new primary. I explained that thoracentesis procedures are often nondiagnostic.Repeat thoracentesis may be required to establish a malignant diagnosis.  I think it would be difficult for her to undergo a bronchoscopy. She indicates she will likely decide on comfort/supportive care if a malignant diagnosis is confirmed. She would like to discuss this further with her husband and daughter.I will be available to speak with him over the  next few days.  My initial impression is to recommend a [facility] hospice referral if she is found to have a malignancy.   Recommendations: 1. Continue treatment of the diarrhea per medicine/gastroenterology 2. Follow-up pleural fluid cytology, consider repeat thoracentesis for diagnosis and comfort 3. I will continue following her in the hospital and arrange for outpatient appointment.        LOS: 3 days   Donneta Romberg, MD   10/14/2017, 3:31 PM

## 2017-10-14 NOTE — Care Management Important Message (Signed)
Important Message  Patient Details  Name: Karen Osborn MRN: [id number] Date of Birth: [birth year]   Medicare Important Message Given:  Yes    Kerin Salen 10/14/2017, 11:49 AMImportant Message  Patient Details  Name: Karen Osborn MRN: [id number] Date of Birth: 06-28-1941   Medicare Important Message Given:  Yes    Kerin Salen 10/14/2017, 11:48 AM

## 2017-10-14 NOTE — Progress Notes (Signed)
Karen Osborn Cina 11:54 AM  Subjective: Patient without any abdominal pain and her nausea is better although she is still not eating much and she still has a little diarrhea whenever she urinates although there is no more blood and she has no new complaints and she is not breathing is well today as she was right after her thoracentesis  Objective: Vital signs stable afebrile no acute distress abdomen is soft nontender stool panel negative  Assessment: Diarrhea with resolved bleeding  Plan: Will recommend probiotics and will start Carafate and will check on tomorrow but okay to hold flexible sigmoidoscopy for now  [facility]  Pager [phone] After 5PM or if no answer call [phone]

## 2017-10-14 NOTE — Progress Notes (Addendum)
PROGRESS NOTE    Karen Osborn  VOZ:[id number] DOB: October 06, 1941 DOA: 10/11/2017 PCP: Orpah Melter, MD    Brief Narrative:  76 y.o. female, w metastatic esophageal carcinoma, Copd apparently c/o rectal bleeding since Thursday. Blood on toilet paper and in toilet bowel.   Was worse today (2 episodes at home and 1 in ER),  And some diarrhea for the past 2 days and  therefore presented to ED for evaluation.    Assessment & Plan:   Principal Problem:   Colitis Active Problems:   Anemia   Hyponatremia   Hypotension   Protein-calorie malnutrition, severe  Colitis C. Diff is negative Patient is continued on empiric Levaquin iv, Flagyl 500mg  iv tid.  GI consulted and is following, holding off on flex sig  Rectal bleeding Pt currently on Full Liquid diet Gi consulted. Initial consideration for flex sig, now plan on hold per GI  Anemia Hemodynamically stable Repeat cbc in AM  Hypotension Trop I q6h x3 AM cortisol within normal limits 2d echo with findings of mild pericardial effusion without tamponade. Patient noted to have moderate bilateral effusions, now s/p thoracentesis per below  Pleural effusion -Given concerns of enlarging RML in the setting of esophageal cancer, there are concerns for malignant effusion -R sided US guided thoracentesis performed, yielding 1L fluid. Cytology pending  Hyponatremia Continued on IVF hydration Improved Repeat bmet in AM  Metastatic esophageal carcinoma Will alert Oncology of patient's admission Enlarged lung mass noted to CT abd/pelvis.  Follow up CT chest with findings of enlarged R lung mass with possible liver lesion as well as four new scattered solid pulmonary nodules -Oncology following. Pt discussing with family regarding how aggressive she would like to pursue course  COPD exacerbation with acute hypoxic respiratory failure On exam, pt sitting upright upright, O2 sats in the mid-80's Continue with scheduled duonebs  and start scheduled IV solumedrol.  ADDENDUM: This afternoon, patient became markedly sob with limited improvement following neb tx. Family in room. Code status re-addressed. Patient's wishes are for do not resuscitate/do not intubate. Orders placed. STAT CXR ordered and reviewed. Findings of new infiltrate, question CHF. IV lasix ordered. Pt already on levofloxacin. Dr. Benay Spice made aware of events.  DVT prophylaxis: SCD's Code Status: Full Family Communication: Pt in room, family not at bedside Disposition Plan: uncertain at this time  Consultants:   GI  Oncology  Procedures:   US guided thoracentesis 10/15  Antimicrobials: Anti-infectives    Start     Dose/Rate Route Frequency Ordered Stop   10/13/17 1000  levofloxacin (LEVAQUIN) IVPB 750 mg     750 mg 100 mL/hr over 90 Minutes Intravenous Every 48 hours 10/12/17 1412     10/12/17 0800  levofloxacin (LEVAQUIN) IVPB 500 mg  Status:  Discontinued     500 mg 100 mL/hr over 60 Minutes Intravenous Every 24 hours 10/12/17 0008 10/12/17 1412   10/12/17 0600  metroNIDAZOLE (FLAGYL) IVPB 500 mg     500 mg 100 mL/hr over 60 Minutes Intravenous Every 8 hours 10/12/17 0008     10/11/17 2145  ciprofloxacin (CIPRO) IVPB 400 mg     400 mg 200 mL/hr over 60 Minutes Intravenous  Once 10/11/17 2140 10/12/17 0038   10/11/17 2145  metroNIDAZOLE (FLAGYL) IVPB 500 mg     500 mg 100 mL/hr over 60 Minutes Intravenous  Once 10/11/17 2140 10/11/17 2318      Subjective: Reports feeling slightly more sob this AM  Objective: Vitals:   10/13/17 2127 10/13/17  2345 10/14/17 0422 10/14/17 0749  BP: 118/81  99/68   Pulse: (!) 119  (!) 103   Resp:   20   Temp: 97.8 F (36.6 C)  97.8 F (36.6 C)   TempSrc: Oral  Oral   SpO2: 99% 96% 98% 94%  Weight:   33.4 kg (73 lb 10.1 oz)   Height:        Intake/Output Summary (Last 24 hours) at 10/14/17 1404 Last data filed at 10/14/17 0600  Gross per 24 hour  Intake              520 ml  Output               200 ml  Net              320 ml   Filed Weights   10/11/17 2357 10/13/17 0509 10/14/17 0422  Weight: 34.2 kg (75 lb 6.4 oz) 36.4 kg (80 lb 4.8 oz) 33.4 kg (73 lb 10.1 oz)    Examination: General exam: Conversant, in no acute distress Respiratory system: normal chest rise, clear, no audible wheezing Cardiovascular system: regular rhythm, s1-s2 Gastrointestinal system: Nondistended, nontender, pos BS Central nervous system: No seizures, no tremors Extremities: No cyanosis, no joint deformities Skin: No rashes, no pallor Psychiatry: Affect normal // no auditory hallucinations   Data Reviewed: I have personally reviewed following labs and imaging studies  CBC:  Recent Labs Lab 10/11/17 1625 10/11/17 1859 10/12/17 0546 10/13/17 0848 10/14/17 0507  WBC 6.8  --  6.3 9.9 8.4  NEUTROABS  --   --   --  9.5*  --   HGB 13.4 11.6* 11.7* 14.2 12.7  HCT 40.6 34.0* 35.4* 42.4 38.3  MCV 95.5  --  93.4 90.4 91.2  PLT 293  --  260 363 425   Basic Metabolic Panel:  Recent Labs Lab 10/11/17 1625 10/11/17 1859 10/12/17 0546 10/13/17 0848 10/14/17 0507  NA 137 134* 134* 128* 132*  K 4.7 4.4 4.6 4.5 4.8  CL 96* 97* 97* 90* 95*  CO2 30  --  31 27 31   GLUCOSE 93 93 90 165* 113*  BUN 15 13 10 11 14   CREATININE 0.51 0.50 0.49 0.45 0.42*  CALCIUM 9.1  --  8.5* 8.8* 8.7*   GFR: Estimated Creatinine Clearance: 33 mL/min (A) (by C-G formula based on SCr of 0.42 mg/dL (L)). Liver Function Tests:  Recent Labs Lab 10/11/17 1625 10/12/17 0546 10/13/17 0848 10/14/17 0507  AST 21 18 26 21   ALT 17 13* 18 17  ALKPHOS 116 90 121 88  BILITOT <0.1* 0.3 0.3 0.3  PROT 6.5 5.2* 6.8 5.2*  ALBUMIN 3.3* 2.6* 3.4* 2.7*    Recent Labs Lab 10/11/17 1625  LIPASE 34   No results for input(s): AMMONIA in the last 168 hours. Coagulation Profile: No results for input(s): INR, PROTIME in the last 168 hours. Cardiac Enzymes:  Recent Labs Lab 10/12/17 0027 10/12/17 0546  10/12/17 1141  TROPONINI <0.03 <0.03 <0.03   BNP (last 3 results)  Recent Labs  09/25/17 1527  PROBNP 87.0   HbA1C: No results for input(s): HGBA1C in the last 72 hours. CBG: No results for input(s): GLUCAP in the last 168 hours. Lipid Profile: No results for input(s): CHOL, HDL, LDLCALC, TRIG, CHOLHDL, LDLDIRECT in the last 72 hours. Thyroid Function Tests: No results for input(s): TSH, T4TOTAL, FREET4, T3FREE, THYROIDAB in the last 72 hours. Anemia Panel: No results for input(s): VITAMINB12, FOLATE, FERRITIN, TIBC, IRON,  RETICCTPCT in the last 72 hours. Sepsis Labs:  Recent Labs Lab 10/11/17 1858  LATICACIDVEN 0.54    Recent Results (from the past 240 hour(s))  Gastrointestinal Panel by PCR , Stool     Status: None   Collection Time: 10/11/17  4:25 PM  Result Value Ref Range Status   Campylobacter species NOT DETECTED NOT DETECTED Final   Plesimonas shigelloides NOT DETECTED NOT DETECTED Final   Salmonella species NOT DETECTED NOT DETECTED Final   Yersinia enterocolitica NOT DETECTED NOT DETECTED Final   Vibrio species NOT DETECTED NOT DETECTED Final   Vibrio cholerae NOT DETECTED NOT DETECTED Final   Enteroaggregative E coli (EAEC) NOT DETECTED NOT DETECTED Final   Enteropathogenic E coli (EPEC) NOT DETECTED NOT DETECTED Final   Enterotoxigenic E coli (ETEC) NOT DETECTED NOT DETECTED Final   Shiga like toxin producing E coli (STEC) NOT DETECTED NOT DETECTED Final   Shigella/Enteroinvasive E coli (EIEC) NOT DETECTED NOT DETECTED Final   Cryptosporidium NOT DETECTED NOT DETECTED Final   Cyclospora cayetanensis NOT DETECTED NOT DETECTED Final   Entamoeba histolytica NOT DETECTED NOT DETECTED Final   Giardia lamblia NOT DETECTED NOT DETECTED Final   Adenovirus F40/41 NOT DETECTED NOT DETECTED Final   Astrovirus NOT DETECTED NOT DETECTED Final   Norovirus GI/GII NOT DETECTED NOT DETECTED Final   Rotavirus A NOT DETECTED NOT DETECTED Final   Sapovirus (I, II, IV, and  V) NOT DETECTED NOT DETECTED Final  Gram stain     Status: None   Collection Time: 10/13/17 12:51 PM  Result Value Ref Range Status   Specimen Description PLEURAL RIGHT  Final   Special Requests NONE  Final   Gram Stain   Final    MODERATE WBC PRESENT,BOTH PMN AND MONONUCLEAR NO ORGANISMS SEEN Performed at [facility], [address], [place],  [address]    Report Status 10/13/2017 FINAL  Final     Radiology Studies: Dg Chest 1 View  Result Date: 10/13/2017 CLINICAL DATA:  Post right-sided thoracentesis EXAM: CHEST 1 VIEW COMPARISON:  10/11/2017; chest CT - 10/13/2017 FINDINGS: Grossly unchanged cardiac silhouette and mediastinal contours. Interval reduction/resolution of right-sided pleural effusion post thoracentesis. No pneumothorax. Unchanged cardiac silhouette and mediastinal contours with atherosclerotic plaque within thoracic aorta. Unchanged appearance of esophageal stent. Suspected increase in size of now small to moderate sized left-sided effusion. Improved aeration of the right lung base. Persistent left basilar heterogeneous/consolidative opacities, likely atelectasis. No acute osseus abnormalities. IMPRESSION: 1. Interval reduction/resolution of right-sided effusion post thoracentesis. No pneumothorax. 2. Suspected increase now small to moderate size left-sided effusion with worsening left basilar opacities, likely atelectasis. Electronically Signed   By: Sandi Mariscal M.D.   On: 10/13/2017 13:58   Ct Chest W Contrast  Result Date: 10/13/2017 CLINICAL DATA:  Inpatient. Metastatic esophageal cancer. Enlarging right middle lobe lung mass on CT abdomen study. EXAM: CT CHEST WITH CONTRAST TECHNIQUE: Multidetector CT imaging of the chest was performed during intravenous contrast administration. CONTRAST:  47mL ISOVUE-300 IOPAMIDOL (ISOVUE-300) INJECTION 61% COMPARISON:  10/11/2017 CT abdomen/ pelvis. 10/11/2017 chest radiograph. 08/26/2017 chest CT. FINDINGS:  Cardiovascular: Normal heart size. No significant pericardial fluid/thickening. Left anterior descending, left circumflex and right coronary atherosclerosis. Atherosclerotic nonaneurysmal thoracic aorta. Normal caliber pulmonary arteries. No central pulmonary emboli. Mediastinum/Nodes: No discrete thyroid nodules. Esophageal stent centered in the mid to lower thoracic esophagus is stable in position and without stenosis or discontinuity. No discrete esophageal wall thickening or mass. No axillary adenopathy. Right paratracheal adenopathy measuring  up to 1.7 cm (series 2/ image 54), increased from 1.1 cm on 08/26/2017. Newly mildly enlarged 1.0 cm right hilar node (series 2/image 70). Lungs/Pleura: No pneumothorax. Moderate dependent pleural effusions bilaterally. Moderate to severe centrilobular emphysema with mild diffuse bronchial wall thickening. Medial right middle lobe 3.4 x 2.4 cm lung mass (series 8/ image 110), increased from 2.5 x 1.8 cm on 08/26/2017. Four new scattered solid pulmonary nodules in the right lung measuring up to the 1.2 cm in the posterior right lower lobe (series 8/ image 87). Mild-to-moderate compressive atelectasis in the dependent lower lobes. Upper abdomen: Percutaneous gastrostomy tube is in place in the proximal stomach. Simple left renal cysts, largest 2.1 cm in the interpolar left kidney. Hyperenhancing 0.7 cm posterior right liver lobe focus (series 2/ image 142), not apparent on 10/11/2017 CT abdomen study, increased from 0.4 cm on 08/26/2017 chest CT. Musculoskeletal: No aggressive appearing focal osseous lesions. Mild superior T6 and T7 vertebral compression fractures appear unchanged. Mild thoracic spondylosis. IMPRESSION: 1. Medial right middle lobe lung mass is increased in size since 08/26/2017 chest CT. Four new scattered solid pulmonary nodules in the right lung. Findings are compatible with progressive pulmonary metastatic disease. 2. Increased mediastinal and right hilar  lymphadenopathy, compatible with nodal metastatic disease. 3. Indeterminate subcentimeter hyperenhancing posterior right liver lobe focus, apparently increased from 08/26/2017 chest CT, not apparent on 10/11/2017 CT abdomen study. Differential includes a liver metastasis or transient benign perfusional anomaly. If clinically relevant, further characterization with MRI abdomen without and with IV contrast could be performed. 4. Stable well-positioned patent mid to lower thoracic esophageal stent with no evidence of local tumor recurrence. 5. Moderate dependent bilateral pleural effusions with associated dependent bilateral lower lobe atelectasis. 6. Three-vessel coronary atherosclerosis. Aortic Atherosclerosis (ICD10-I70.0) and Emphysema (ICD10-J43.9). Electronically Signed   By: Ilona Sorrel M.D.   On: 10/13/2017 09:08   US Thoracentesis Asp Pleural Space W/img Guide  Result Date: 10/13/2017 INDICATION: Metastatic esophageal carcinoma, dyspnea, bilateral pleural effusions, right greater than left. Request made for diagnostic and therapeutic right thoracentesis. EXAM: ULTRASOUND GUIDED DIAGNOSTIC AND THERAPEUTIC RIGHT THORACENTESIS MEDICATIONS: None. COMPLICATIONS: None immediate. PROCEDURE: An ultrasound guided thoracentesis was thoroughly discussed with the patient and questions answered. The benefits, risks, alternatives and complications were also discussed. The patient understands and wishes to proceed with the procedure. Written consent was obtained. Ultrasound was performed to localize and mark an adequate pocket of fluid in the right chest. The area was then prepped and draped in the normal sterile fashion. 2% Lidocaine was used for local anesthesia. Under ultrasound guidance a Safe-T-Centesis catheter was introduced. Thoracentesis was performed. The catheter was removed and a dressing applied. FINDINGS: A total of approximately 1 liter of yellow fluid was removed. Samples were sent to the laboratory as  requested by the clinical team. IMPRESSION: Successful ultrasound guided diagnostic and therapeutic right thoracentesis yielding 1 liter of pleural fluid. Read by: Rowe Robert, PA-C Electronically Signed   By: Sandi Mariscal M.D.   On: 10/13/2017 13:16    Scheduled Meds: . albuterol  2.5 mg Nebulization QID  . feeding supplement (OSMOLITE 1.5 CAL)  237 mL Per Tube QID  . methylPREDNISolone (SOLU-MEDROL) injection  40 mg Intravenous Q12H  . mirtazapine  15 mg Oral QHS  . pantoprazole  40 mg Oral Daily  . potassium chloride  10 mEq Oral Daily  . rOPINIRole  0.25 mg Oral QHS  . saccharomyces boulardii  250 mg Oral BID  . sucralfate  1 g Oral  Q6H  . tiotropium  18 mcg Inhalation Daily   Continuous Infusions: . levofloxacin (LEVAQUIN) IV Stopped (10/13/17 1033)  . metronidazole 500 mg (10/14/17 1325)     LOS: 3 days   Zamire Whitehurst, Orpah Melter, MD [facility] Pager [phone]  If 7PM-7AM, please contact night-coverage [email or web address] Password [email or web address] 10/14/2017, 2:04 PM

## 2017-10-15 DIAGNOSIS — R0902 Hypoxemia: Secondary | ICD-10-CM

## 2017-10-15 DIAGNOSIS — E871 Hypo-osmolality and hyponatremia: Secondary | ICD-10-CM

## 2017-10-15 DIAGNOSIS — K529 Noninfective gastroenteritis and colitis, unspecified: Secondary | ICD-10-CM

## 2017-10-15 NOTE — Care Management Note (Signed)
Case Management Note  Patient Details  Name: Karen Osborn MRN: [id number] Date of Birth: December 11, 1941  Subjective/Objective:   CM referral for home w/hospice choice-spoke to patient/spouse/dtr in rm-[facility] contacted for referral to assess if accepted. Patient already has [name] for dme-concerns w/current dme-hospital bed,mattress, & 02 concentrator-[facility] rep Santiago Glad in rm to discuss concerns. Dr. Learta Codding is the Dr. Patient wishes to follow under home w/hospice. Patient will transport home by family.                 Action/Plan:d/c home w/hospice.   Expected Discharge Date:                  Expected Discharge Plan:  Home w Hospice Care  In-House Referral:     Discharge planning Services  CM Consult  Post Acute Care Choice:    Choice offered to:  Patient, Spouse  DME Arranged:    DME Agency:     HH Arranged:  RN [place] Agency:  [place]  Status of Service:  Completed, signed off  If discussed at [employer], dates discussed:    Additional Comments:  Dessa Phi, RN 10/15/2017, 2:05 PM

## 2017-10-15 NOTE — Progress Notes (Signed)
PROGRESS NOTE    SHALICE WOODRING  IRW:[id number] DOB: Jul 15, 1941 DOA: 10/11/2017 PCP: Orpah Melter, MD    Brief Narrative:   76 y.o. female, w metastatic esophageal carcinoma, Copd, presents with blood in stool,Workup significant for colitis, C. Difficile is negative, she is on IV levofloxacin and Flagyl.  Assessment & Plan:   Principal Problem:   Colitis Active Problems:   Anemia   Hyponatremia   Hypotension   Protein-calorie malnutrition, severe  Colitis - Presents with diarrhea, blood in stool, imaging significant for acute colitis, improving on levofloxacin and Flagyl, diarrhea has significantly improved. - Seen by GI, holding off on flex sig  Rectal bleeding - Pt currently on Full Liquid diet - Gi consulted. Initial consideration for flex sig, now plan on hold per GI   Hypotension Trop I q6h x3 AM cortisol within normal limits 2d echo with findings of mild pericardial effusion without tamponade. Patient noted to have moderate bilateral effusions, now s/p thoracentesis per below  Pleural effusion -Given concerns of enlarging RML in the setting of esophageal cancer, there are concerns for malignant effusion -R sided US guided thoracentesis performed, yielding 1L fluid. Cytology is nonspecific, significant for atypical cell,  Hyponatremia - improving with IV hydration  Metastatic esophageal carcinoma -Follow up CT chest with findings of enlarged R lung mass with possible liver lesion as well as four new scattered solid pulmonary nodules -Oncology following. Apparently patient choosing to go with hospice, they were consulted by oncology  COPD exacerbation with chronic hypoxic respiratory failure - she is on oxygen baseline at home, continue with scheduled nebs, and low dose IV Solu-Medrol   DVT prophylaxis: SCD's Code Status: DO NOT RESUSCITATE Family Communication: none at bedside Disposition Plan: home with hospice services  Consultants:    GI  Oncology  Procedures:   US guided thoracentesis 10/15  Antimicrobials: Anti-infectives    Start     Dose/Rate Route Frequency Ordered Stop   10/13/17 1000  levofloxacin (LEVAQUIN) IVPB 750 mg     750 mg 100 mL/hr over 90 Minutes Intravenous Every 48 hours 10/12/17 1412     10/12/17 0800  levofloxacin (LEVAQUIN) IVPB 500 mg  Status:  Discontinued     500 mg 100 mL/hr over 60 Minutes Intravenous Every 24 hours 10/12/17 0008 10/12/17 1412   10/12/17 0600  metroNIDAZOLE (FLAGYL) IVPB 500 mg     500 mg 100 mL/hr over 60 Minutes Intravenous Every 8 hours 10/12/17 0008     10/11/17 2145  ciprofloxacin (CIPRO) IVPB 400 mg     400 mg 200 mL/hr over 60 Minutes Intravenous  Once 10/11/17 2140 10/12/17 0038   10/11/17 2145  metroNIDAZOLE (FLAGYL) IVPB 500 mg     500 mg 100 mL/hr over 60 Minutes Intravenous  Once 10/11/17 2140 10/11/17 2318      Subjective: She denies any complaints today, no dyspnea, no nausea, no further diarrhea today  Objective: Vitals:   10/15/17 0500 10/15/17 0558 10/15/17 0803 10/15/17 1147  BP:  94/66    Pulse:  99    Resp:  20    Temp:  97.7 F (36.5 C)    TempSrc:  Oral    SpO2:  94% 92% 98%  Weight: 33.6 kg (74 lb 1.2 oz)     Height:        Intake/Output Summary (Last 24 hours) at 10/15/17 1353 Last data filed at 10/15/17 0600  Gross per 24 hour  Intake  540 ml  Output              400 ml  Net              140 ml   Filed Weights   10/13/17 0509 10/14/17 0422 10/15/17 0500  Weight: 36.4 kg (80 lb 4.8 oz) 33.4 kg (73 lb 10.1 oz) 33.6 kg (74 lb 1.2 oz)    Examination:  Awake alert 3, frail thin-appearing lady in bed in no apparent distress. A good air entry bilaterally, clear to auscultation Regular rate and rhythm, no rubs murmurs gallops Abdomen soft, nontender, bowel sounds present, peak present Extremities with no edema, clubbing or stenosis Skin with no rash or pallor Affect normal // no auditory hallucinations    Data Reviewed: I have personally reviewed following labs and imaging studies  CBC:  Recent Labs Lab 10/11/17 1625 10/11/17 1859 10/12/17 0546 10/13/17 0848 10/14/17 0507  WBC 6.8  --  6.3 9.9 8.4  NEUTROABS  --   --   --  9.5*  --   HGB 13.4 11.6* 11.7* 14.2 12.7  HCT 40.6 34.0* 35.4* 42.4 38.3  MCV 95.5  --  93.4 90.4 91.2  PLT 293  --  260 363 175   Basic Metabolic Panel:  Recent Labs Lab 10/11/17 1625 10/11/17 1859 10/12/17 0546 10/13/17 0848 10/14/17 0507  NA 137 134* 134* 128* 132*  K 4.7 4.4 4.6 4.5 4.8  CL 96* 97* 97* 90* 95*  CO2 30  --  31 27 31   GLUCOSE 93 93 90 165* 113*  BUN 15 13 10 11 14   CREATININE 0.51 0.50 0.49 0.45 0.42*  CALCIUM 9.1  --  8.5* 8.8* 8.7*   GFR: Estimated Creatinine Clearance: 33.2 mL/min (A) (by C-G formula based on SCr of 0.42 mg/dL (L)). Liver Function Tests:  Recent Labs Lab 10/11/17 1625 10/12/17 0546 10/13/17 0848 10/14/17 0507  AST 21 18 26 21   ALT 17 13* 18 17  ALKPHOS 116 90 121 88  BILITOT <0.1* 0.3 0.3 0.3  PROT 6.5 5.2* 6.8 5.2*  ALBUMIN 3.3* 2.6* 3.4* 2.7*    Recent Labs Lab 10/11/17 1625  LIPASE 34   No results for input(s): AMMONIA in the last 168 hours. Coagulation Profile: No results for input(s): INR, PROTIME in the last 168 hours. Cardiac Enzymes:  Recent Labs Lab 10/12/17 0027 10/12/17 0546 10/12/17 1141  TROPONINI <0.03 <0.03 <0.03   BNP (last 3 results)  Recent Labs  09/25/17 1527  PROBNP 87.0   HbA1C: No results for input(s): HGBA1C in the last 72 hours. CBG: No results for input(s): GLUCAP in the last 168 hours. Lipid Profile: No results for input(s): CHOL, HDL, LDLCALC, TRIG, CHOLHDL, LDLDIRECT in the last 72 hours. Thyroid Function Tests: No results for input(s): TSH, T4TOTAL, FREET4, T3FREE, THYROIDAB in the last 72 hours. Anemia Panel: No results for input(s): VITAMINB12, FOLATE, FERRITIN, TIBC, IRON, RETICCTPCT in the last 72 hours. Sepsis Labs:  Recent Labs Lab  10/11/17 1858  LATICACIDVEN 0.54    Recent Results (from the past 240 hour(s))  Gastrointestinal Panel by PCR , Stool     Status: None   Collection Time: 10/11/17  4:25 PM  Result Value Ref Range Status   Campylobacter species NOT DETECTED NOT DETECTED Final   Plesimonas shigelloides NOT DETECTED NOT DETECTED Final   Salmonella species NOT DETECTED NOT DETECTED Final   Yersinia enterocolitica NOT DETECTED NOT DETECTED Final   Vibrio species NOT DETECTED NOT DETECTED Final  Vibrio cholerae NOT DETECTED NOT DETECTED Final   Enteroaggregative E coli (EAEC) NOT DETECTED NOT DETECTED Final   Enteropathogenic E coli (EPEC) NOT DETECTED NOT DETECTED Final   Enterotoxigenic E coli (ETEC) NOT DETECTED NOT DETECTED Final   Shiga like toxin producing E coli (STEC) NOT DETECTED NOT DETECTED Final   Shigella/Enteroinvasive E coli (EIEC) NOT DETECTED NOT DETECTED Final   Cryptosporidium NOT DETECTED NOT DETECTED Final   Cyclospora cayetanensis NOT DETECTED NOT DETECTED Final   Entamoeba histolytica NOT DETECTED NOT DETECTED Final   Giardia lamblia NOT DETECTED NOT DETECTED Final   Adenovirus F40/41 NOT DETECTED NOT DETECTED Final   Astrovirus NOT DETECTED NOT DETECTED Final   Norovirus GI/GII NOT DETECTED NOT DETECTED Final   Rotavirus A NOT DETECTED NOT DETECTED Final   Sapovirus (I, II, IV, and V) NOT DETECTED NOT DETECTED Final  Culture, body fluid-bottle     Status: None (Preliminary result)   Collection Time: 10/13/17 12:51 PM  Result Value Ref Range Status   Specimen Description PLEURAL RIGHT  Final   Special Requests NONE  Final   Culture   Final    NO GROWTH < 24 HOURS Performed at [facility], [address], [place], [place] [address]    Report Status PENDING  Incomplete  Gram stain     Status: None   Collection Time: 10/13/17 12:51 PM  Result Value Ref Range Status   Specimen Description PLEURAL RIGHT  Final   Special Requests NONE  Final   Gram Stain   Final     MODERATE WBC PRESENT,BOTH PMN AND MONONUCLEAR NO ORGANISMS SEEN Performed at [facility], [address], [place],  [address]    Report Status 10/13/2017 FINAL  Final     Radiology Studies: Dg Chest 1 View  Result Date: 10/13/2017 CLINICAL DATA:  Post right-sided thoracentesis EXAM: CHEST 1 VIEW COMPARISON:  10/11/2017; chest CT - 10/13/2017 FINDINGS: Grossly unchanged cardiac silhouette and mediastinal contours. Interval reduction/resolution of right-sided pleural effusion post thoracentesis. No pneumothorax. Unchanged cardiac silhouette and mediastinal contours with atherosclerotic plaque within thoracic aorta. Unchanged appearance of esophageal stent. Suspected increase in size of now small to moderate sized left-sided effusion. Improved aeration of the right lung base. Persistent left basilar heterogeneous/consolidative opacities, likely atelectasis. No acute osseus abnormalities. IMPRESSION: 1. Interval reduction/resolution of right-sided effusion post thoracentesis. No pneumothorax. 2. Suspected increase now small to moderate size left-sided effusion with worsening left basilar opacities, likely atelectasis. Electronically Signed   By: Sandi Mariscal M.D.   On: 10/13/2017 13:58   Dg Chest Port 1 View  Result Date: 10/14/2017 CLINICAL DATA:  Shortness of breath. Post thoracentesis . History of esophageal cancer. EXAM: PORTABLE CHEST 1 VIEW COMPARISON:  10/13/2017 . FINDINGS: Mediastinum and hilar structures are normal. Heart size normal. What appears be esophageal stents in stable position. New right base infiltrate and right-sided small pleural effusion periods persistent left base infiltrate left-sided pleural effusion. No pneumothorax. No acute bony abnormality. IMPRESSION: 1. New right base infiltrate and small right pleural effusion. Persistent left lower lobe infiltrate and left-sided pleural effusion. A component of CHF cannot be excluded. No evidence of pneumothorax post  thoracentesis. 2 esophageal stent in stable position. Electronically Signed   By: [place]   On: 10/14/2017 16:23    Scheduled Meds: . albuterol  2.5 mg Nebulization QID  . feeding supplement (OSMOLITE 1.5 CAL)  237 mL Per Tube QID  . methylPREDNISolone (SOLU-MEDROL) injection  40 mg  Intravenous Q12H  . mirtazapine  15 mg Oral QHS  . pantoprazole  40 mg Oral Daily  . potassium chloride  10 mEq Oral Daily  . rOPINIRole  0.25 mg Oral QHS  . saccharomyces boulardii  250 mg Oral BID  . sucralfate  1 g Oral Q6H  . tiotropium  18 mcg Inhalation Daily   Continuous Infusions: . levofloxacin (LEVAQUIN) IV Stopped (10/15/17 1151)  . metronidazole 500 mg (10/15/17 1318)     LOS: 4 days   Phillips Climes, MD [facility] Pager [phone]  If 7PM-7AM, please contact night-coverage [email or web address] Password [email or web address] 10/15/2017, 1:53 PM

## 2017-10-15 NOTE — Progress Notes (Signed)
Karen Osborn 2:24 PM  Subjective: Patient doing better today without any signs of bleeding and her bowel problem is not as bad and she likes to Carafate and we discussed her case with her family and the hospital team  Objective: Vital signs stable afebrile no acute distress patient looks better today  Assessment: Improved GI symptoms  Plan: Continue Carafate at home and probiotics are okay to and either me or Dr. Michail Sermon happy to see back when necessary and please call us if we could be of any further assistance with this hospital stay  [facility]  Pager [phone] After 5PM or if no answer call [phone]

## 2017-10-15 NOTE — Progress Notes (Signed)
IP PROGRESS NOTE  Subjective:   Ms. Hartsough feels better this morning. She has decided to pursue hospice care.  Objective: Vital signs in last 24 hours: Blood pressure 94/66, pulse 99, temperature 97.7 F (36.5 C), temperature source Oral, resp. rate 20, height 5' (1.524 m), weight 74 lb 1.2 oz (33.6 kg), SpO2 98 %.  Intake/Output from previous day: 10/16 0701 - 10/17 0700 In: 540 [P.O.:240; IV Piggyback:300] Out: 400 [Urine:400]  Physical Exam:   Lungs: distant breath sounds Cardiac: regular rate and rhythm Abdomen: no hepatosplenomegaly, left upper quadrant feeding tube Extremities: no leg edema   Lab Results:  Recent Labs  10/13/17 0848 10/14/17 0507  WBC 9.9 8.4  HGB 14.2 12.7  HCT 42.4 38.3  PLT 363 284    BMET  Recent Labs  10/13/17 0848 10/14/17 0507  NA 128* 132*  K 4.5 4.8  CL 90* 95*  CO2 27 31  GLUCOSE 165* 113*  BUN 11 14  CREATININE 0.45 0.42*  CALCIUM 8.8* 8.7*    No results found for: CEA1  Studies/Results: Dg Chest 1 View  Result Date: 10/13/2017 CLINICAL DATA:  Post right-sided thoracentesis EXAM: CHEST 1 VIEW COMPARISON:  10/11/2017; chest CT - 10/13/2017 FINDINGS: Grossly unchanged cardiac silhouette and mediastinal contours. Interval reduction/resolution of right-sided pleural effusion post thoracentesis. No pneumothorax. Unchanged cardiac silhouette and mediastinal contours with atherosclerotic plaque within thoracic aorta. Unchanged appearance of esophageal stent. Suspected increase in size of now small to moderate sized left-sided effusion. Improved aeration of the right lung base. Persistent left basilar heterogeneous/consolidative opacities, likely atelectasis. No acute osseus abnormalities. IMPRESSION: 1. Interval reduction/resolution of right-sided effusion post thoracentesis. No pneumothorax. 2. Suspected increase now small to moderate size left-sided effusion with worsening left basilar opacities, likely atelectasis.  Electronically Signed   By: Sandi Mariscal M.D.   On: 10/13/2017 13:58   Dg Chest Port 1 View  Result Date: 10/14/2017 CLINICAL DATA:  Shortness of breath. Post thoracentesis . History of esophageal cancer. EXAM: PORTABLE CHEST 1 VIEW COMPARISON:  10/13/2017 . FINDINGS: Mediastinum and hilar structures are normal. Heart size normal. What appears be esophageal stents in stable position. New right base infiltrate and right-sided small pleural effusion periods persistent left base infiltrate left-sided pleural effusion. No pneumothorax. No acute bony abnormality. IMPRESSION: 1. New right base infiltrate and small right pleural effusion. Persistent left lower lobe infiltrate and left-sided pleural effusion. A component of CHF cannot be excluded. No evidence of pneumothorax post thoracentesis. 2 esophageal stent in stable position. Electronically Signed   By: Marcello Moores  Register   On: 10/14/2017 16:23   US Thoracentesis Asp Pleural Space W/img Guide  Result Date: 10/13/2017 INDICATION: Metastatic esophageal carcinoma, dyspnea, bilateral pleural effusions, right greater than left. Request made for diagnostic and therapeutic right thoracentesis. EXAM: ULTRASOUND GUIDED DIAGNOSTIC AND THERAPEUTIC RIGHT THORACENTESIS MEDICATIONS: None. COMPLICATIONS: None immediate. PROCEDURE: An ultrasound guided thoracentesis was thoroughly discussed with the patient and questions answered. The benefits, risks, alternatives and complications were also discussed. The patient understands and wishes to proceed with the procedure. Written consent was obtained. Ultrasound was performed to localize and mark an adequate pocket of fluid in the right chest. The area was then prepped and draped in the normal sterile fashion. 2% Lidocaine was used for local anesthesia. Under ultrasound guidance a Safe-T-Centesis catheter was introduced. Thoracentesis was performed. The catheter was removed and a dressing applied. FINDINGS: A total of  approximately 1 liter of yellow fluid was removed. Samples were sent to the laboratory as  requested by the clinical team. IMPRESSION: Successful ultrasound guided diagnostic and therapeutic right thoracentesis yielding 1 liter of pleural fluid. Read by: Rowe Robert, PA-C Electronically Signed   By: Sandi Mariscal M.D.   On: 10/13/2017 13:16    Medications: I have reviewed the patient's current medications.  Assessment/Plan:  1.Squamous cell carcinoma the midesophagus  Staging CT scans 10/29/2016 with no evidence of metastatic disease; nonspecific liver lesions, right hydronephrosis, retroperitoneal lymph nodes, and sclerotic right seventh rib lesion  Staging PET scan 11/13/2016-low left jugular/supraclavicular node measuring 5 mm and SUV max of 2.6; left paratracheal node measuring8 mm and SUV max 5.8; periaorticnodes including a 6 mm node SUV max 4.4; right retrocrural hypermetabolic node measures 8 mm and SUV max of 9.4;hypermetabolism corresponding to the mid esophageal primary SUV max 15.6; no right hepatic lobe hypermetabolism; no hypermetabolism to correspond to the retroperitoneal mild adenopathy on prior exam.  Initiation of radiation 11/25/2016 and concurrent weekly Taxol/carboplatin 11/26/2016; week 5 Taxol/carboplatin 12/31/2016; radiation completed 01/03/2017  Esophagram03/07/2017-smooth narrowing of the thoracic esophagus  Upper endoscopy 05/05/2017-benign appearing esophageal stenosis, dilated. Acute gastritis.  Upper endoscopy 06/12/2017-benign appearing esophageal stenosis. Dilated. Acute gastritis.  Esophagus stent placement 08/19/2017  CT chest 10/13/2017-right middle lobe mass, pulmonary nodules, bilateral pleural effusions,mediastinal/hilar adenopathy  Right thoracentesis 10/13/2017-cytology pending  2. COPD  3. 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 Right hydronephrosis on  the CT 10/29/2016  5. Dysphagia/weight loss secondary to #1  Placement of an esophagus stent 08/19/2017  6. Rash upper back 12/10/2016. Question etiology.  7. Feeding tube placement 12/26/2016  8. Diarrhea, failure to thrive 02/12/2017. C. difficile negative 02/18/2017. Improved 02/20/2017.  9. Acute mid back pain-04/21/2017; x-ray thoracic spine with minimal upper thoracic vertebral body compression fracture.  10. Herpes zoster left upper lateral back. 7 day course of Valtrex prescribed.  11. Admission 10/11/2017 with rectal bleeding, probably diverticulitis  Ms. Calef appears unchanged. She has decided on Home Hospice care. We can arrange for another therapeutic thoracentesis if she has progressive dyspnea. I discussed the apparent diagnosis of recurrent esophagus cancer with her husband and daughter last night. They are in agreement with Hospice care. I will plan to serve as the primary physician with Hospice.  Recommendations: 1. [facility] referral for home Hospice care 2. Outpatient follow-up will be scheduled at the [facility] for within the next few weeks.        LOS: 4 days   Donneta Romberg, MD   10/15/2017, 12:56 PM

## 2017-10-15 NOTE — Consult Note (Signed)
[facility]: Met with pt and husband. They are agreeable and receptive to hospice care at home. Equipment to be switched out in home this evening. Oxygne, hospital bed and OBT, and nebulizer being delivered. Switching old bed for fully electric bed and with new mattress. The pt will be disharged h ome tomorrow we have a tentative plan for a 200pm initial assessment visit at pt's home if the MD feels pt is stable and can discharge tomorrow. Spoke to [facility] rep and she has delivered oxygen tank to room for pt to have and go home by car tomorrow. Webb Silversmith RN  [phone]

## 2017-10-15 NOTE — Progress Notes (Signed)
PT Cancellation Note  Patient Details Name: Karen Osborn MRN: [id number] DOB: 09/12/1941   Cancelled Treatment:    Reason Eval/Treat Not Completed: Other (comment) (gastrostomy tube leaking, NT notifying RN, will check back as schedule permits.)   Shaolin Armas,KATHrine E 10/15/2017, 3:38 PM Carmelia Bake, PT, DPT 10/15/2017 Pager: [phone]

## 2017-10-16 DIAGNOSIS — C7889 Secondary malignant neoplasm of other digestive organs: Secondary | ICD-10-CM

## 2017-10-16 MED ORDER — SACCHAROMYCES BOULARDII 250 MG PO CAPS: 250.0000 mg | ORAL_CAPSULE | Freq: Two times a day (BID) | ORAL | 0 refills | Status: AC

## 2017-10-16 MED ORDER — GUAIFENESIN ER 600 MG PO TB12: 600.0000 mg | ORAL_TABLET | Freq: Two times a day (BID) | ORAL | 0 refills | Status: AC

## 2017-10-16 MED ORDER — ALBUTEROL SULFATE (2.5 MG/3ML) 0.083% IN NEBU: 2.5000 mg | INHALATION_SOLUTION | Freq: Four times a day (QID) | RESPIRATORY_TRACT | 12 refills | Status: AC

## 2017-10-16 MED ORDER — MORPHINE SULFATE (CONCENTRATE) 10 MG /0.5 ML PO SOLN: 5.0000 mg | Freq: Four times a day (QID) | ORAL | 0 refills | Status: AC | PRN

## 2017-10-16 MED ORDER — SUCRALFATE 1 GM/10ML PO SUSP: 1.0000 g | Freq: Four times a day (QID) | ORAL | 0 refills | Status: AC

## 2017-10-16 MED ORDER — LEVOFLOXACIN 750 MG PO TABS: 750.0000 mg | ORAL_TABLET | Freq: Every day | ORAL | 0 refills | Status: AC

## 2017-10-16 MED ORDER — LORAZEPAM 0.5 MG PO TABS: 0.5000 mg | ORAL_TABLET | Freq: Three times a day (TID) | ORAL | 0 refills | Status: AC | PRN

## 2017-10-16 MED ORDER — TIOTROPIUM BROMIDE MONOHYDRATE 18 MCG IN CAPS: 18.0000 ug | ORAL_CAPSULE | Freq: Every day | RESPIRATORY_TRACT | 12 refills | Status: AC

## 2017-10-16 MED ORDER — PREDNISONE 10 MG (21) PO TBPK: ORAL_TABLET | ORAL | 0 refills | Status: AC

## 2017-10-16 NOTE — Evaluation (Signed)
Physical Therapy Evaluation-1x Patient Details Name: Karen Osborn MRN: [id number] DOB: 05-Apr-1941 Today's Date: 10/16/2017   History of Present Illness  76 yo female admitted with colitis, new R lung mass. Hx of met esophageal cancer, COPD, breast cancer.   Clinical Impression  On eval, pt was Min guard assist for mobility. She walked ~15 feet with a RW. Pt remained on [place] O2. Dyspnea 2-3/4 with activity. Husband present during session. Plan is to d/c home with hospice. Per pt/husband, hospice is arranging delivery of DME. Suggest pt consider using a RW for ambulation safety. 1x eval. Will sign off.     Follow Up Recommendations No PT follow up (pt to d/c home with hospice)    Equipment Recommendations  Rolling walker with 5" wheels (if not already arranged by hospice)    Recommendations for Other Services       Precautions / Restrictions Precautions Precautions: Fall Restrictions Weight Bearing Restrictions: No      Mobility  Bed Mobility Overal bed mobility: Modified Independent                Transfers Overall transfer level: Needs assistance Equipment used: Rolling walker (2 wheeled) Transfers: Sit to/from Stand Sit to Stand: Min guard         General transfer comment: close guard for safety. VCs safety, hand placement  Ambulation/Gait Ambulation/Gait assistance: Min guard Ambulation Distance (Feet): 15 Feet Assistive device: Rolling walker (2 wheeled) Gait Pattern/deviations: Step-through pattern;Decreased stride length     General Gait Details: close guard for safety. Used RW for safety/stability. Remained on  O2.   Stairs            Wheelchair Mobility    Modified Rankin (Stroke Patients Only)       Balance                                             Pertinent Vitals/Pain Pain Assessment: No/denies pain    Home Living Family/patient expects to be discharged to:: Private residence Living Arrangements:  Spouse/significant other Available Help at Discharge: Family Type of Home: House Home Access: Level entry     Home Layout: One level Home Equipment: None Additional Comments: getting DME from hospice    Prior Function Level of Independence: Needs assistance   Gait / Transfers Assistance Needed: husband assisting. Not using any DME           Hand Dominance        Extremity/Trunk Assessment   Upper Extremity Assessment Upper Extremity Assessment: Generalized weakness    Lower Extremity Assessment Lower Extremity Assessment: Generalized weakness    Cervical / Trunk Assessment Cervical / Trunk Assessment: Kyphotic  Communication   Communication: No difficulties  Cognition Arousal/Alertness: Awake/alert Behavior During Therapy: WFL for tasks assessed/performed Overall Cognitive Status: Within Functional Limits for tasks assessed                                        General Comments      Exercises     Assessment/Plan    PT Assessment Patent does not need any further PT services (going home with hospice)  PT Problem List         PT Treatment Interventions      PT Goals (Current goals can  be found in the Care Plan section)  Acute Rehab PT Goals Patient Stated Goal: home PT Goal Formulation: All assessment and education complete, DC therapy    Frequency     Barriers to discharge        Co-evaluation               AM-PAC PT "6 Clicks" Daily Activity  Outcome Measure Difficulty turning over in bed (including adjusting bedclothes, sheets and blankets)?: None Difficulty moving from lying on back to sitting on the side of the bed? : None Difficulty sitting down on and standing up from a chair with arms (e.g., wheelchair, bedside commode, etc,.)?: A Little Help needed moving to and from a bed to chair (including a wheelchair)?: A Little Help needed walking in hospital room?: A Little Help needed climbing 3-5 steps with a railing?  : A Little 6 Click Score: 20    End of Session Equipment Utilized During Treatment: Gait belt;Oxygen Activity Tolerance: Patient tolerated treatment well Patient left: in chair;with call bell/phone within reach;with family/visitor present;with nursing/sitter in room   PT Visit Diagnosis: Muscle weakness (generalized) (M62.81);Difficulty in walking, not elsewhere classified (R26.2)    Time: 3832-9191 PT Time Calculation (min) (ACUTE ONLY): 10 min   Charges:   PT Evaluation $PT Eval Low Complexity: 1 Low     PT G Codes:         Weston Anna, MPT Pager: [phone]

## 2017-10-16 NOTE — Discharge Instructions (Signed)
-   further management per home hospice agency, patient on full liquid diet, do not advance, as well she is on PEG feed.

## 2017-10-16 NOTE — Discharge Summary (Signed)
Karen Osborn, is a 76 y.o. female  DOB 16-Nov-1941  MRN [id number].  Admission date:  10/11/2017  Admitting Physician  Jani Gravel, MD  Discharge Date:  10/16/2017   Primary MD  Orpah Melter, MD  Recommendations for primary care physician for things to follow:  - further management per hospice agency  CODE STATUS: DO NOT RESUSCITATE  Admission Diagnosis  Colitis [K52.9] Diarrhea, unspecified type [R19.7]   Discharge Diagnosis  Colitis [K52.9] Diarrhea, unspecified type [R19.7]    Principal Problem:   Colitis Active Problems:   Anemia   Hyponatremia   Hypotension   Protein-calorie malnutrition, severe      Past Medical History:  Diagnosis Date  . Breast cancer ([place])    left  . COPD (chronic obstructive pulmonary disease) ([place])   . Dyspnea    with exertion   . Esophageal stricture   . GERD (gastroesophageal reflux disease)   . Pneumonia    hx of   . 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 BALLOON DILATION N/A 05/05/2017   Procedure: BALLOON DILATION;  Surgeon: Wilford Corner, MD;  Location: [facility];  Service: Endoscopy;  Laterality: N/A;  . BREAST LUMPECTOMY Left 1/132006  . ESOPHAGEAL STENT PLACEMENT N/A 08/19/2017   Procedure: ESOPHAGEAL STENT PLACEMENT;  Surgeon: Clarene Essex, MD;  Location: [facility];  Service: Endoscopy;  Laterality: N/A;  . ESOPHAGOGASTRODUODENOSCOPY (EGD) WITH PROPOFOL N/A 10/24/2016   Procedure: ESOPHAGOGASTRODUODENOSCOPY (EGD) WITH PROPOFOL;  Surgeon: Wilford Corner, MD;  Location: [facility] ENDOSCOPY;  Service: Endoscopy;  Laterality: N/A;  . ESOPHAGOGASTRODUODENOSCOPY (EGD) WITH PROPOFOL N/A 05/05/2017   Procedure: ESOPHAGOGASTRODUODENOSCOPY (EGD) WITH PROPOFOL;  Surgeon: Wilford Corner, MD;  Location: [facility];  Service: Endoscopy;  Laterality: N/A;  .  ESOPHAGOGASTRODUODENOSCOPY (EGD) WITH PROPOFOL N/A 06/12/2017   Procedure: ESOPHAGOGASTRODUODENOSCOPY (EGD) WITH PROPOFOL;  Surgeon: Wilford Corner, MD;  Location: [place];  Service: Endoscopy;  Laterality: N/A;  . ESOPHAGOGASTRODUODENOSCOPY (EGD) WITH PROPOFOL N/A 08/19/2017   Procedure: ESOPHAGOGASTRODUODENOSCOPY (EGD) WITH PROPOFOL;  Surgeon: Clarene Essex, MD;  Location: [facility];  Service: Endoscopy;  Laterality: N/A;  with stent placement- metal, removable, covered- 10-12 18-23.  . IR CM INJ ANY COLONIC TUBE W/FLUORO  05/14/2017  . IR GENERIC HISTORICAL  12/26/2016   IR GASTROSTOMY TUBE MOD SED 12/26/2016 Jacqulynn Cadet, MD [facility]  . IR GENERIC HISTORICAL  01/17/2017   IR PATIENT EVAL TECH 0-60 MINS [facility]  . SAVORY DILATION N/A 06/12/2017   Procedure: SAVORY DILATION with Fluoro;  Surgeon: Wilford Corner, MD;  Location: [facility];  Service: Endoscopy;  Laterality: N/A;       History of present illness and  Hospital Course:     Kindly see H&P for history of present illness and admission details, please review complete Labs, Consult reports and Test reports for all details in brief  HPI  from the history and physical done on the day of admission  10/11/2017    Karen Osborn  is a  76 y.o. female, w metastatic esophageal carcinoma, Copd apparently c/o rectal bleeding since Thursday. Blood on toilet paper and in toilet bowel.   Was worse today (2 episodes at home and 1 in ER),  And some diarrhea for the past 2 days and  therefore presented to ED for evaluation.    In ED,  Hgb 11.6, Plt 293 Na 134 Bp 88/62  Pt was tx with Ns iv and type and screen and CT scan abd/ pelvis=> IMPRESSION: 1. Mild wall thickening and surrounding edema of sigmoid colon and rectum compatible with acute colitis. No perforation or abscess. 2. Increased size of mass within the medial aspect of right middle lobe with heterogeneous enhancement suspicious for metastasis. 3.  Moderate right and small left pleural effusions. 4. New mild L4 vertebral body compression deformity from 08/19/2017.  Pt will be admitted for colitis.    Hospital Course    Metastatic esophageal carcinoma -Follow up CT chest with findings of enlarged R lung mass with possible liver lesion as well as four new scattered solid pulmonary nodules -Oncology following. Apparently patient choosing to go with hospice, hospice has been consulted, plan to go home with hospice services.  Colitis - Presents with diarrhea, blood in stool, imaging significant for acute colitis, improving on levofloxacin and Flagyl, diarrhea has significantly improved.seen by GI, no further workup, will be discharged on Carafate.  Rectal bleeding - Pt currently on Full Liquid diet - Gi consulted. Initial consideration for flex sig, now plan on hold per GI  Hypotension Trop I q6h x3 AM cortisol within normal limits 2d echo with findings of mild pericardial effusion without tamponade. Patient noted to have moderate bilateral effusions, now s/p thoracentesis per below  Pleural effusion -Given concerns of enlarging RML in the setting of esophageal cancer, there are concerns for malignant effusion -R sided US guided thoracentesis performed, yielding 1L fluid. Cytology is nonspecific, significant for atypical cell,  Hyponatremia - improved with IV hydration   COPD exacerbation with chronic hypoxic respiratory failure - she is on oxygen baseline at home,Will be discharged on nebs, and oxygen, and prednisone taper - Chest x-ray significant for right lung base pneumonia, she will be discharged on another 5 days of oxygen, she will be given incentive spirometry and flutter valve prior to  discharge, continue with the Mucinex    Discharge Condition:  Home with Hospice   Follow UP  Follow-up Information    [facility] Follow up.   Why:  home nurse Contact information: [address] [place] [address] [phone]             Discharge Instructions  and  Discharge Medications     Discharge Instructions    Discharge instructions    Complete by:  As directed    - further management per home hospice agency, patient on full liquid diet, do not advance, as well she is on PEG feed.     Allergies as of 10/16/2017      Reactions   Amoxicillin Nausea And Vomiting   Has patient had a PCN reaction causing immediate rash, facial/tongue/throat swelling, SOB or lightheadedness with hypotension: No Has patient had a PCN reaction causing severe rash involving mucus membranes or skin necrosis: No Has patient had a PCN reaction that required hospitalization No Has patient had a PCN reaction occurring within the last 10 years: Yes If all of the above answers are "NO", then may proceed with Cephalosporin use.   Benadryl [diphenhydramine Hcl]  Other (See Comments)   Jittery, patient states "legs bouncing up and down"   Codeine Nausea And Vomiting      Medication List    STOP taking these medications   cefdinir 300 MG capsule Commonly known as:  OMNICEF   furosemide 40 MG tablet Commonly known as:  LASIX     TAKE these medications   acetaminophen 160 MG/5ML liquid Commonly known as:  TYLENOL Take 325 mg by mouth every 4 (four) hours as needed for pain.   albuterol (2.5 MG/3ML) 0.083% nebulizer solution Commonly known as:  PROVENTIL Take 3 mLs (2.5 mg total) by nebulization 4 (four) times daily. What changed:  when to take this  reasons to take this  Another medication with the same name was removed. Continue taking this medication, and follow the directions you see here.   feeding supplement (OSMOLITE 1.5 CAL) Liqd Place 237 mLs into feeding tube 5 (five) times daily. What changed:  when to take this  additional instructions   guaiFENesin 600 MG 12 hr tablet Commonly known as:  MUCINEX Take 1 tablet (600 mg total) by mouth 2 (two) times  daily.   guaiFENesin-dextromethorphan 100-10 MG/5ML syrup Commonly known as:  ROBITUSSIN DM Take 10 mLs by mouth every 4 (four) hours as needed for cough.   lansoprazole 30 MG disintegrating tablet Commonly known as:  PREVACID SOLUTAB Take 30 mg by mouth daily.   levofloxacin 750 MG tablet Commonly known as:  LEVAQUIN Take 1 tablet (750 mg total) by mouth daily.   LORazepam 0.5 MG tablet Commonly known as:  ATIVAN Take 1 tablet (0.5 mg total) by mouth every 8 (eight) hours as needed for anxiety (dyspnea).   mirtazapine 15 MG tablet Commonly known as:  REMERON Take 1 tablet (15 mg total) by mouth at bedtime.   morphine CONCENTRATE 10 mg / 0.5 ml concentrated solution Take 0.25 mLs (5 mg total) by mouth every 6 (six) hours as needed for severe pain or shortness of breath.   ondansetron 8 MG disintegrating tablet Commonly known as:  ZOFRAN-ODT Take 1 tablet (8 mg total) by mouth every 8 (eight) hours as needed for nausea or vomiting.   potassium chloride 10 MEQ tablet Commonly known as:  K-DUR Take 1 tablet (10 mEq total) by mouth daily.   predniSONE 10 MG (21) Tbpk tablet Commonly known as:  STERAPRED UNI-PAK 21 TAB Use per package instructions   rOPINIRole 0.25 MG tablet Commonly known as:  REQUIP Take 0.25 mg by mouth at bedtime. 1-2 hours before bedtime   saccharomyces boulardii 250 MG capsule Commonly known as:  FLORASTOR Take 1 capsule (250 mg total) by mouth 2 (two) times daily.   sucralfate 1 GM/10ML suspension Commonly known as:  CARAFATE Take 10 mLs (1 g total) by mouth 4 (four) times daily.   tiotropium 18 MCG inhalation capsule Commonly known as:  SPIRIVA Place 1 capsule (18 mcg total) into inhaler and inhale daily.   Tiotropium Bromide-Olodaterol 2.5-2.5 MCG/ACT Aers Commonly known as:  STIOLTO RESPIMAT Inhale 2 puffs into the lungs daily.   trolamine salicylate 10 % cream Commonly known as:  ASPERCREME Apply 1 application topically as needed for  muscle pain.         Diet and Activity recommendation: See Discharge Instructions above   Consults obtained -  GI Oncology   Major procedures and Radiology Reports - PLEASE review detailed and final reports for all details, in brief -    US guided thoracentesis 10/15  Dg Chest 1 View  Result Date: 10/13/2017 CLINICAL DATA:  Post right-sided thoracentesis EXAM: CHEST 1 VIEW COMPARISON:  10/11/2017; chest CT - 10/13/2017 FINDINGS: Grossly unchanged cardiac silhouette and mediastinal contours. Interval reduction/resolution of right-sided pleural effusion post thoracentesis. No pneumothorax. Unchanged cardiac silhouette and mediastinal contours with atherosclerotic plaque within thoracic aorta. Unchanged appearance of esophageal stent. Suspected increase in size of now small to moderate sized left-sided effusion. Improved aeration of the right lung base. Persistent left basilar heterogeneous/consolidative opacities, likely atelectasis. No acute osseus abnormalities. IMPRESSION: 1. Interval reduction/resolution of right-sided effusion post thoracentesis. No pneumothorax. 2. Suspected increase now small to moderate size left-sided effusion with worsening left basilar opacities, likely atelectasis. Electronically Signed   By: Sandi Mariscal M.D.   On: 10/13/2017 13:58   Dg Chest 2 View  Result Date: 10/11/2017 CLINICAL DATA:  76 year old female with diarrhea and bloody stools. Cough. Esophageal stent. Breast cancer. Former smoker. EXAM: CHEST  2 VIEW COMPARISON:  Chest radiographs 09/25/2017 and earlier. FINDINGS: Semi upright AP and lateral views of the chest. Stable mid and lower thoracic esophageal stent. Increased small bilateral pleural effusions since September greater on the right. No superimposed pneumothorax or pulmonary edema. Widespread emphysema. Stable cardiac size and mediastinal contours. Osteopenia. Stable visualized osseous structures. Paucity of bowel gas in the upper abdomen.  IMPRESSION: 1. Increasing small bilateral pleural effusions since September. 2. No new cardiopulmonary abnormality. 3. Stable esophageal stent.  Emphysema (ICD10-J43.9). Electronically Signed   By: Genevie Ann M.D.   On: 10/11/2017 19:20   Dg Chest 2 View  Result Date: 09/25/2017 CLINICAL DATA:  Cough, congestion, former smoking history EXAM: CHEST  2 VIEW COMPARISON:  CT the chest of 08/26/2017 and chest x-ray of the same day FINDINGS: The lungs are markedly hyperaerated consistent with emphysema. No pneumonia is seen. However there do appear to be small pleural effusions blunting the costophrenic angles. A rounded mass is noted medially at the right lung base. Compared to the prior CT, this area may represent loculated fluid with low-attenuation by CT, but continued followup is recommended to exclude an enlarging mass. Mediastinal and hilar contours are unremarkable. An esophageal stent is noted in the mid distal portion of the esophagus. The heart is within normal limits in size. Minimally compressed T6 vertebral body appears stable. IMPRESSION: 1. Severe emphysematous change. 2. Small pleural effusions. 3. Rounded mass medially at the right lung base may represent loculated fluid but recommend attention to this area on follow-up imaging. 4. Esophageal stent present. Electronically Signed   By: Ivar Drape M.D.   On: 09/25/2017 14:50   Ct Chest W Contrast  Result Date: 10/13/2017 CLINICAL DATA:  Inpatient. Metastatic esophageal cancer. Enlarging right middle lobe lung mass on CT abdomen study. EXAM: CT CHEST WITH CONTRAST TECHNIQUE: Multidetector CT imaging of the chest was performed during intravenous contrast administration. CONTRAST:  64mL ISOVUE-300 IOPAMIDOL (ISOVUE-300) INJECTION 61% COMPARISON:  10/11/2017 CT abdomen/ pelvis. 10/11/2017 chest radiograph. 08/26/2017 chest CT. FINDINGS: Cardiovascular: Normal heart size. No significant pericardial fluid/thickening. Left anterior descending, left  circumflex and right coronary atherosclerosis. Atherosclerotic nonaneurysmal thoracic aorta. Normal caliber pulmonary arteries. No central pulmonary emboli. Mediastinum/Nodes: No discrete thyroid nodules. Esophageal stent centered in the mid to lower thoracic esophagus is stable in position and without stenosis or discontinuity. No discrete esophageal wall thickening or mass. No axillary adenopathy. Right paratracheal adenopathy measuring up to 1.7 cm (series 2/ image 54), increased from 1.1 cm on 08/26/2017. Newly mildly enlarged 1.0 cm right hilar node (  series 2/image 70). Lungs/Pleura: No pneumothorax. Moderate dependent pleural effusions bilaterally. Moderate to severe centrilobular emphysema with mild diffuse bronchial wall thickening. Medial right middle lobe 3.4 x 2.4 cm lung mass (series 8/ image 110), increased from 2.5 x 1.8 cm on 08/26/2017. Four new scattered solid pulmonary nodules in the right lung measuring up to the 1.2 cm in the posterior right lower lobe (series 8/ image 87). Mild-to-moderate compressive atelectasis in the dependent lower lobes. Upper abdomen: Percutaneous gastrostomy tube is in place in the proximal stomach. Simple left renal cysts, largest 2.1 cm in the interpolar left kidney. Hyperenhancing 0.7 cm posterior right liver lobe focus (series 2/ image 142), not apparent on 10/11/2017 CT abdomen study, increased from 0.4 cm on 08/26/2017 chest CT. Musculoskeletal: No aggressive appearing focal osseous lesions. Mild superior T6 and T7 vertebral compression fractures appear unchanged. Mild thoracic spondylosis. IMPRESSION: 1. Medial right middle lobe lung mass is increased in size since 08/26/2017 chest CT. Four new scattered solid pulmonary nodules in the right lung. Findings are compatible with progressive pulmonary metastatic disease. 2. Increased mediastinal and right hilar lymphadenopathy, compatible with nodal metastatic disease. 3. Indeterminate subcentimeter hyperenhancing  posterior right liver lobe focus, apparently increased from 08/26/2017 chest CT, not apparent on 10/11/2017 CT abdomen study. Differential includes a liver metastasis or transient benign perfusional anomaly. If clinically relevant, further characterization with MRI abdomen without and with IV contrast could be performed. 4. Stable well-positioned patent mid to lower thoracic esophageal stent with no evidence of local tumor recurrence. 5. Moderate dependent bilateral pleural effusions with associated dependent bilateral lower lobe atelectasis. 6. Three-vessel coronary atherosclerosis. Aortic Atherosclerosis (ICD10-I70.0) and Emphysema (ICD10-J43.9). Electronically Signed   By: Ilona Sorrel M.D.   On: 10/13/2017 09:08   Ct Abdomen Pelvis W Contrast  Result Date: 10/11/2017 CLINICAL DATA:  76 y/o  F; abdominal pain.  Blood in stool. EXAM: CT ABDOMEN AND PELVIS WITH CONTRAST TECHNIQUE: Multidetector CT imaging of the abdomen and pelvis was performed using the standard protocol following bolus administration of intravenous contrast. CONTRAST:  29mL ISOVUE-300 IOPAMIDOL (ISOVUE-300) INJECTION 61%, 62mL ISOVUE-300 IOPAMIDOL (ISOVUE-300) INJECTION 61% COMPARISON:  10/29/2016 CT abdomen and pelvis. 08/19/2017 lumbar spine radiographs. FINDINGS: Lower chest: Moderate right and small left pleural effusions. Partially visualized stent within the distal esophagus. Partially visualize heterogeneous lesion within medial aspect of right middle lobe is increased in size measuring up to 4.4 cm (series 2, image 1). Hepatobiliary: Stable low-attenuation lesions within the liver at the segment 6/7 junction (series 2, image 24) with equilibration on the venous phase, probably hemangioma. No additional liver lesion identified. Normal gallbladder. No intra or extrahepatic biliary ductal dilatation. Pancreas: Unremarkable. No pancreatic ductal dilatation or surrounding inflammatory changes. Spleen: Normal in size without focal  abnormality. Adrenals/Urinary Tract: Normal adrenal glands. Bilateral kidney lower pole homogeneous fluid attenuating well-circumscribed lesions the largest on the right measuring up to 2.9 cm compatible with a cyst. No hydronephrosis. Normal bladder peer Stomach/Bowel: Mild wall thickening and surrounding inflammatory changes involving the sigmoid colon and rectum compatible with acute colitis. No findings for perforation or abscess. Small bowel is unremarkable. Percutaneous gastrostomy tube balloon in the body of stomach. Vascular/Lymphatic: Aortic atherosclerosis. No enlarged abdominal or pelvic lymph nodes. Reproductive: Uterus and bilateral adnexa are unremarkable. Other: No abdominal wall hernia or abnormality. No abdominopelvic ascites. Musculoskeletal: New L4 vertebral body superior endplate compression deformity with mild loss of vertebral body height. IMPRESSION: 1. Mild wall thickening and surrounding edema of sigmoid colon and rectum compatible with  acute colitis. No perforation or abscess. 2. Increased size of mass within the medial aspect of right middle lobe with heterogeneous enhancement suspicious for metastasis. 3. Moderate right and small left pleural effusions. 4. New mild L4 vertebral body compression deformity from 08/19/2017. Electronically Signed   By: Kristine Garbe M.D.   On: 10/11/2017 21:29   Dg Chest Port 1 View  Result Date: 10/14/2017 CLINICAL DATA:  Shortness of breath. Post thoracentesis . History of esophageal cancer. EXAM: PORTABLE CHEST 1 VIEW COMPARISON:  10/13/2017 . FINDINGS: Mediastinum and hilar structures are normal. Heart size normal. What appears be esophageal stents in stable position. New right base infiltrate and right-sided small pleural effusion periods persistent left base infiltrate left-sided pleural effusion. No pneumothorax. No acute bony abnormality. IMPRESSION: 1. New right base infiltrate and small right pleural effusion. Persistent left lower  lobe infiltrate and left-sided pleural effusion. A component of CHF cannot be excluded. No evidence of pneumothorax post thoracentesis. 2 esophageal stent in stable position. Electronically Signed   By: Marcello Moores  Register   On: 10/14/2017 16:23   US Thoracentesis Asp Pleural Space W/img Guide  Result Date: 10/13/2017 INDICATION: Metastatic esophageal carcinoma, dyspnea, bilateral pleural effusions, right greater than left. Request made for diagnostic and therapeutic right thoracentesis. EXAM: ULTRASOUND GUIDED DIAGNOSTIC AND THERAPEUTIC RIGHT THORACENTESIS MEDICATIONS: None. COMPLICATIONS: None immediate. PROCEDURE: An ultrasound guided thoracentesis was thoroughly discussed with the patient and questions answered. The benefits, risks, alternatives and complications were also discussed. The patient understands and wishes to proceed with the procedure. Written consent was obtained. Ultrasound was performed to localize and mark an adequate pocket of fluid in the right chest. The area was then prepped and draped in the normal sterile fashion. 2% Lidocaine was used for local anesthesia. Under ultrasound guidance a Safe-T-Centesis catheter was introduced. Thoracentesis was performed. The catheter was removed and a dressing applied. FINDINGS: A total of approximately 1 liter of yellow fluid was removed. Samples were sent to the laboratory as requested by the clinical team. IMPRESSION: Successful ultrasound guided diagnostic and therapeutic right thoracentesis yielding 1 liter of pleural fluid. Read by: Rowe Robert, PA-C Electronically Signed   By: Sandi Mariscal M.D.   On: 10/13/2017 13:16    Micro Results    Recent Results (from the past 240 hour(s))  Gastrointestinal Panel by PCR , Stool     Status: None   Collection Time: 10/11/17  4:25 PM  Result Value Ref Range Status   Campylobacter species NOT DETECTED NOT DETECTED Final   Plesimonas shigelloides NOT DETECTED NOT DETECTED Final   Salmonella species NOT  DETECTED NOT DETECTED Final   Yersinia enterocolitica NOT DETECTED NOT DETECTED Final   Vibrio species NOT DETECTED NOT DETECTED Final   Vibrio cholerae NOT DETECTED NOT DETECTED Final   Enteroaggregative E coli (EAEC) NOT DETECTED NOT DETECTED Final   Enteropathogenic E coli (EPEC) NOT DETECTED NOT DETECTED Final   Enterotoxigenic E coli (ETEC) NOT DETECTED NOT DETECTED Final   Shiga like toxin producing E coli (STEC) NOT DETECTED NOT DETECTED Final   Shigella/Enteroinvasive E coli (EIEC) NOT DETECTED NOT DETECTED Final   Cryptosporidium NOT DETECTED NOT DETECTED Final   Cyclospora cayetanensis NOT DETECTED NOT DETECTED Final   Entamoeba histolytica NOT DETECTED NOT DETECTED Final   Giardia lamblia NOT DETECTED NOT DETECTED Final   Adenovirus F40/41 NOT DETECTED NOT DETECTED Final   Astrovirus NOT DETECTED NOT DETECTED Final   Norovirus GI/GII NOT DETECTED NOT DETECTED Final   Rotavirus A NOT  DETECTED NOT DETECTED Final   Sapovirus (I, II, IV, and V) NOT DETECTED NOT DETECTED Final  Culture, body fluid-bottle     Status: None (Preliminary result)   Collection Time: 10/13/17 12:51 PM  Result Value Ref Range Status   Specimen Description PLEURAL RIGHT  Final   Special Requests NONE  Final   Culture   Final    NO GROWTH 2 DAYS Performed at [facility], [address], [place], [place] [address]    Report Status PENDING  Incomplete  Gram stain     Status: None   Collection Time: 10/13/17 12:51 PM  Result Value Ref Range Status   Specimen Description PLEURAL RIGHT  Final   Special Requests NONE  Final   Gram Stain   Final    MODERATE WBC PRESENT,BOTH PMN AND MONONUCLEAR NO ORGANISMS SEEN Performed at [facility], [address], [place], [place] [address]    Report Status 10/13/2017 FINAL  Final       Today   Subjective:   Karen Osborn today has no headache,no chest or abdominal pain,had some diarrhea overnight.  Objective:   Blood pressure 106/69,  pulse (!) 109, temperature (!) 97.5 F (36.4 C), temperature source Oral, resp. rate 18, height 5' (1.524 m), weight 31.7 kg (69 lb 14.2 oz), SpO2 (!) 89 %.   Intake/Output Summary (Last 24 hours) at 10/16/17 1154 Last data filed at 10/16/17 0832  Gross per 24 hour  Intake              720 ml  Output             1025 ml  Net             -305 ml    Exam  Awake alert 3, frail thin-appearing lady in bed in no apparent distress. A good air entry bilaterally, clear to auscultation Regular rate and rhythm, no rubs murmurs gallops Abdomen soft, nontender, bowel sounds present, peak present Extremities with no edema, clubbing or stenosis Skin with no rash or pallor Affect normal // no auditory hallucinations   Data Review   CBC w Diff:  Lab Results  Component Value Date   WBC 8.4 10/14/2017   HGB 12.7 10/14/2017   HGB 13.1 01/14/2017   HCT 38.3 10/14/2017   HCT 39.6 01/14/2017   PLT 284 10/14/2017   PLT 218 01/14/2017   LYMPHOPCT 3 10/13/2017   LYMPHOPCT 4.9 (L) 01/14/2017   MONOPCT 1 10/13/2017   MONOPCT 18.5 (H) 01/14/2017   EOSPCT 0 10/13/2017   EOSPCT 3.5 01/14/2017   BASOPCT 0 10/13/2017   BASOPCT 0.7 01/14/2017    CMP:  Lab Results  Component Value Date   NA 132 (L) 10/14/2017   NA 131 (L) 02/12/2017   K 4.8 10/14/2017   K 3.4 (L) 02/12/2017   CL 95 (L) 10/14/2017   CO2 31 10/14/2017   CO2 31 (H) 02/12/2017   BUN 14 10/14/2017   BUN 14.7 02/12/2017   CREATININE 0.42 (L) 10/14/2017   CREATININE 0.5 (L) 02/12/2017   PROT 5.2 (L) 10/14/2017   PROT 5.7 (L) 02/12/2017   ALBUMIN 2.7 (L) 10/14/2017   ALBUMIN 2.7 (L) 02/12/2017   BILITOT 0.3 10/14/2017   BILITOT 0.38 02/12/2017   ALKPHOS 88 10/14/2017   ALKPHOS 62 02/12/2017   AST 21 10/14/2017   AST 23 02/12/2017   ALT 17 10/14/2017   ALT 25 02/12/2017  .   Total Time in preparing  paper work, data evaluation and todays exam - 35 minutes  Sherrina Zaugg M.D on 10/16/2017 at 11:54 AM  [facility]   Office  [phone]

## 2017-10-16 NOTE — Progress Notes (Signed)
All d/c insttructions given w/ verbal understanding including IS and acapella.d/c to home w/ husband.Hospice to see at around 1400

## 2017-10-16 NOTE — Progress Notes (Signed)
Patient ID: Karen Osborn, female   DOB: 08/23/1941, 76 y.o.   MRN: [id number]  Plan for home hospice noted. Came by to see Ms. Osgood prior to discharge and she is sitting in the chair with a breathing treatment and seems to be in good spirits. Husband in room. Thank you to the great care provided by all of her doctors, nurses, and staff.

## 2017-10-16 NOTE — Consult Note (Signed)
[place]: Met with pt and her spouse in room. [facility] has equipment set up and pt will be prepared to go home. Pt reports MD has been in and that she is being discharged as plan. Husband is ready to take pt home by car when discharge paper completed. We plan to follow up with pt in home today at [place] [phone]

## 2017-10-18 LAB — CULTURE, BODY FLUID W GRAM STAIN -BOTTLE

## 2017-10-18 LAB — CULTURE, BODY FLUID-BOTTLE: CULTURE: NO GROWTH

## 2017-10-20 ENCOUNTER — Telehealth: Payer: Self-pay | Admitting: *Deleted

## 2017-10-20 ENCOUNTER — Telehealth: Payer: Self-pay | Admitting: Hematology

## 2017-10-20 NOTE — Telephone Encounter (Signed)
FAXED Karen Osborn

## 2017-10-20 NOTE — Telephone Encounter (Signed)
Call from pt's daughter requesting documentation to submit to pt's cancer policy. Pt was recently discharged from hospital with hospice. Informed her I will forward request to HIM and financial counselors who are familiar with this.

## 2017-10-21 ENCOUNTER — Telehealth: Payer: Self-pay | Admitting: Pulmonary Disease

## 2017-10-21 NOTE — Telephone Encounter (Signed)
-----   Message from Ilona Sorrel sent at 10/20/2017  4:24 PM EDT ----- Regarding: FW: CT chest  FYI - Pt's daughter Leonia Reeves called me.  She states mom was sent home with Hospice when discharged from the hospital.  She is not wanting to schedule any testing at this time.  Amber's phone # is [phone].  She wanted Dr Elsworth Soho to be aware of her mom's condition.  Judeen Hammans  ----- Message ----- From: Ilona Sorrel Sent: 10/17/2017  12:17 PM To: Rigoberto Noel, MD, Valerie Salts, CMA Subject: FW: CT chest                                   I'm sorry, the order was actually for a Clinical Swallow Eval but Modified Barium had to be ordered first & I have that scheduled for 10/24.  Judeen Hammans ----- Message ----- From: Ilona Sorrel Sent: 10/17/2017  12:12 PM To: Rigoberto Noel, MD, Valerie Salts, CMA Subject: FW: CT chest                                   Pt was also scheduled for Modified Barium Swallow.  I have that scheduled for 10/24 and have not called pt to make her aware because she has been in the hospital since 10/13 was just discharged yesterday.  Do you still want her to have the Modified Barium and the CT (CT done in hospital 10/15)?  Judeen Hammans ----- Message ----- From: Ilona Sorrel Sent: 10/13/2017  11:30 AM To: Rigoberto Noel, MD, Valerie Salts, CMA Subject: CT chest                                       You put in an order on 10/11 for pt to have CT Chest wo contrast in one month.  I had left the pt a vm to see if she would like to go to [facility] in [place] or  [place].  I noticed today she was admitted over the weekend to [facility] I had a CT Chest with contrast this morning.  Do I still need to schedule CT in a month?   Judeen Hammans

## 2017-10-21 NOTE — Telephone Encounter (Signed)
She was discharged home withhospice. Please cancel all testing

## 2017-10-22 ENCOUNTER — Other Ambulatory Visit (HOSPITAL_COMMUNITY): Payer: Medicare Other

## 2017-10-22 ENCOUNTER — Ambulatory Visit (HOSPITAL_COMMUNITY): Admit: 2017-10-22 | Payer: Medicare Other

## 2017-10-28 ENCOUNTER — Encounter: Payer: Self-pay | Admitting: Oncology

## 2017-10-28 NOTE — Progress Notes (Signed)
Called patient responding to staff message that was sent to [facility] regarding cancer policy. Left voicemail with my contact name and number.

## 2017-10-30 DEATH — deceased

## 2017-11-10 ENCOUNTER — Ambulatory Visit: Payer: Medicare Other | Admitting: Adult Health

## 2017-11-17 ENCOUNTER — Ambulatory Visit: Payer: Self-pay | Admitting: Nurse Practitioner

## 2018-02-13 NOTE — Progress Notes (Incomplete)
Malignant neoplasm of middle third of esophagus,6 month FU.  Pain: Skin: Nausea/Vomiting: Swallowing problems/pain/swallowing dificulity:  Appetite: Weight:                                                                    Fatigue:

## 2018-02-18 ENCOUNTER — Inpatient Hospital Stay
Admission: RE | Admit: 2018-02-18 | Discharge: 2018-02-18 | Disposition: A | Discharge status: Home / Self Care | Payer: Self-pay | Source: Ambulatory Visit | Attending: Radiation Oncology | Admitting: Radiation Oncology

## 2018-03-12 ENCOUNTER — Other Ambulatory Visit: Payer: Self-pay | Admitting: Nurse Practitioner

## 2018-05-29 IMAGING — CR DG CHEST 2V
2 series · 2 of 2 positions shown · non-contrast
Comparison: February 09, 2017, thoracic spine film August 19, 2017

CLINICAL DATA: Chest pain and shortness of breath

EXAM:
CHEST  2 VIEW

[w chest pa]
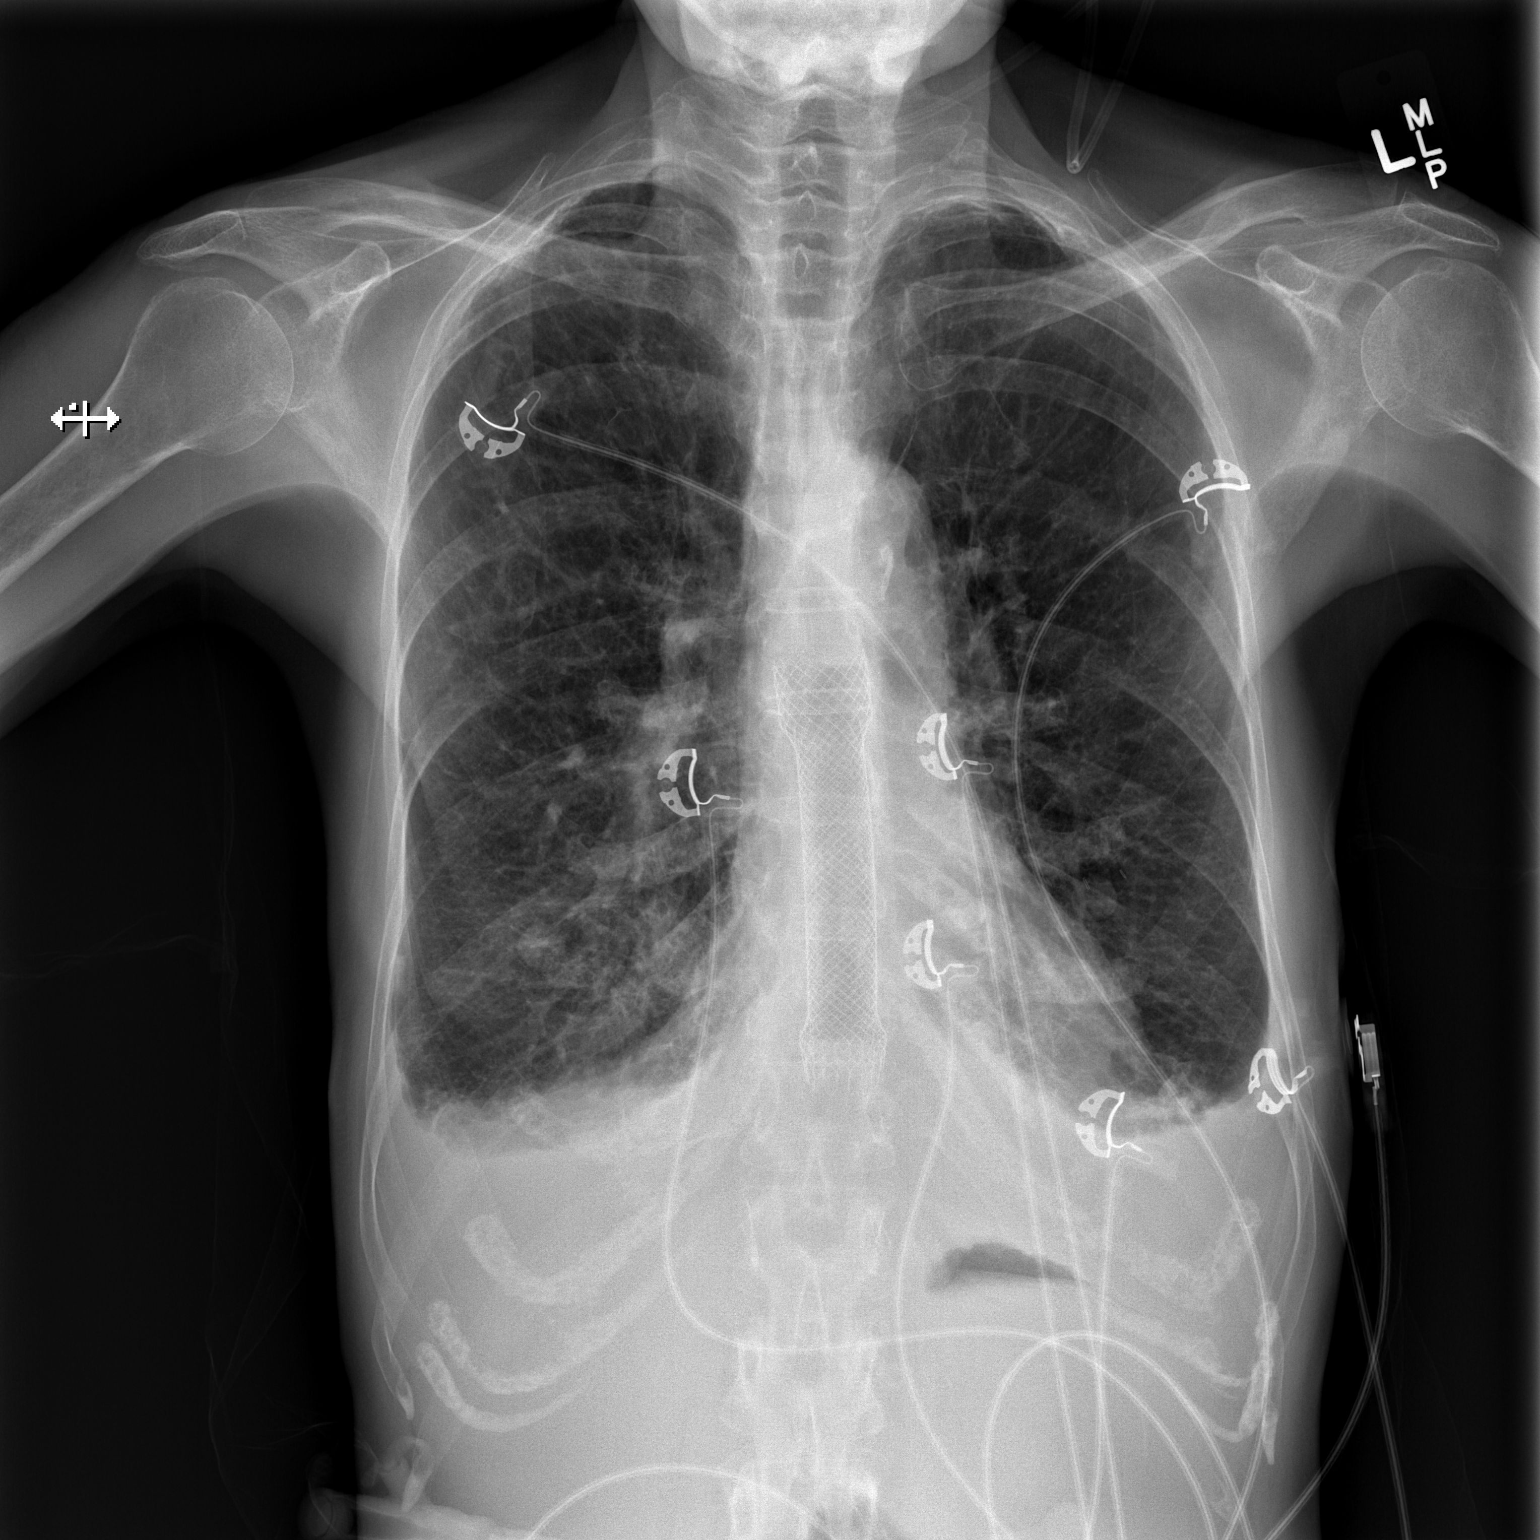

[w chest lat]
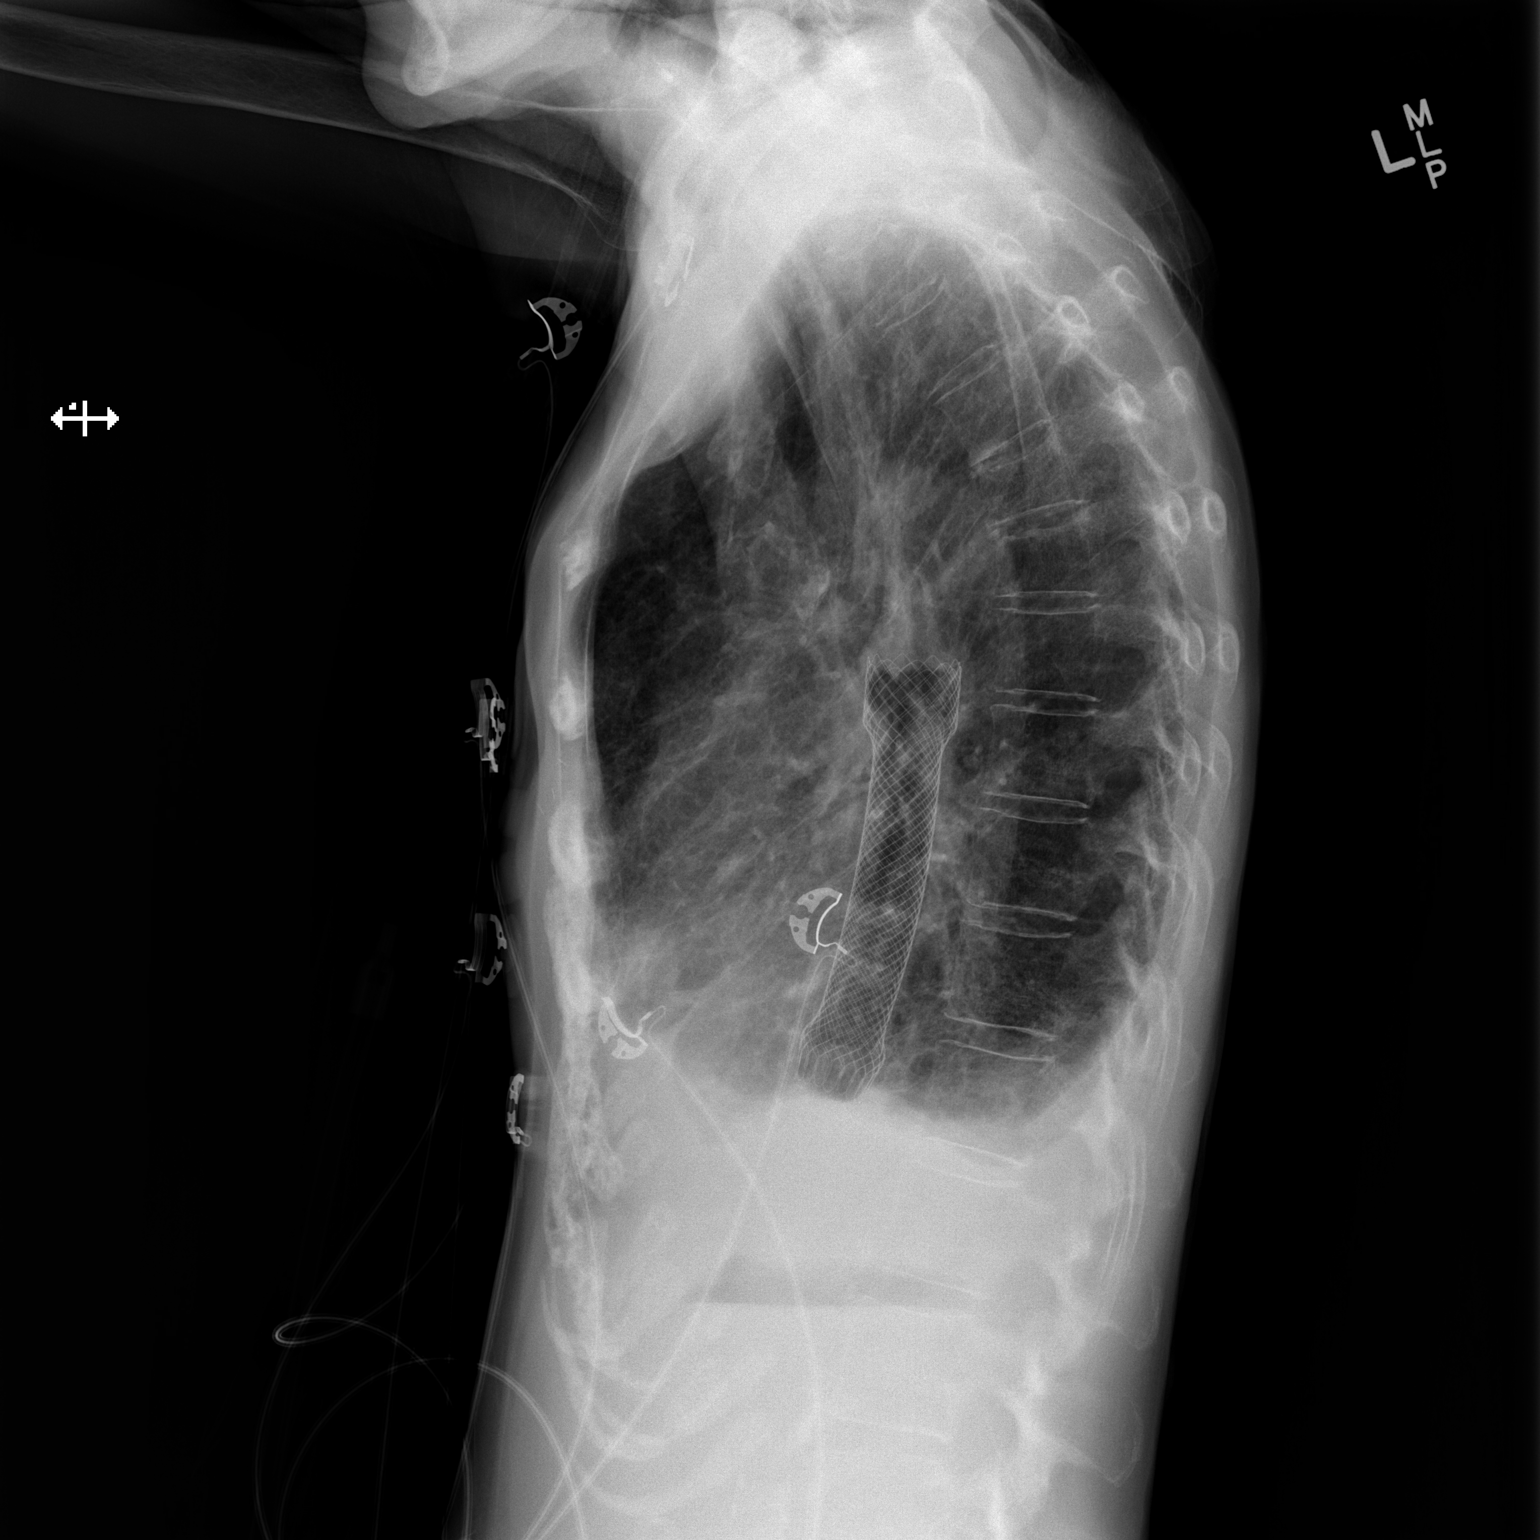

[2 of 2 positions shown; findings below may reference images not displayed]

FINDINGS: The heart size and mediastinal contours are stable. A stent is
projected in the posterior midline. Small bilateral pleural
effusions are identified. There is no focal pneumonia or pulmonary
edema. The visualized skeletal structures are unremarkable.
IMPRESSION: Small bilateral pleural effusions.

## 2018-05-29 IMAGING — CT CT ANGIO CHEST
2 of 6 series · 19 of 36 positions shown · IV contrast (ISOVUE 370)
Comparison: 02/03/2017

CLINICAL DATA: Of breath, productive cough. History of breast
cancer.

EXAM:
CT ANGIOGRAPHY CHEST WITH CONTRAST
TECHNIQUE: Multidetector CT imaging of the chest was performed using the
standard protocol during bolus administration of intravenous
contrast. Multiplanar CT image reconstructions and MIPs were
obtained to evaluate the vascular anatomy.
CONTRAST:  70 cc Isovue 370 IV

[Series 7: thins for pacs · axial · 0.59mm/px · z∈[-257,-5]mm · 18 of 280 slices shown]
[im 14/280  lung]
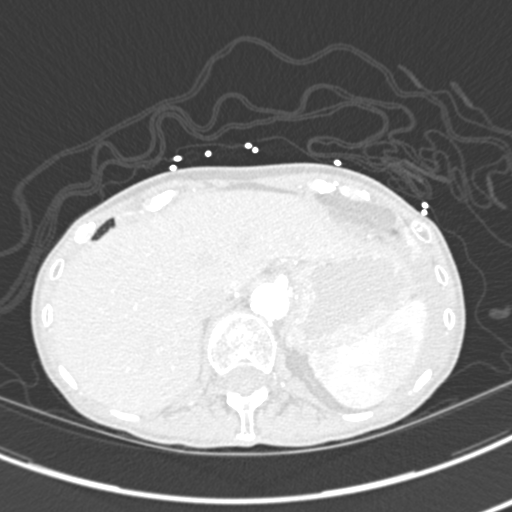
[im 28/280  mediastinal]
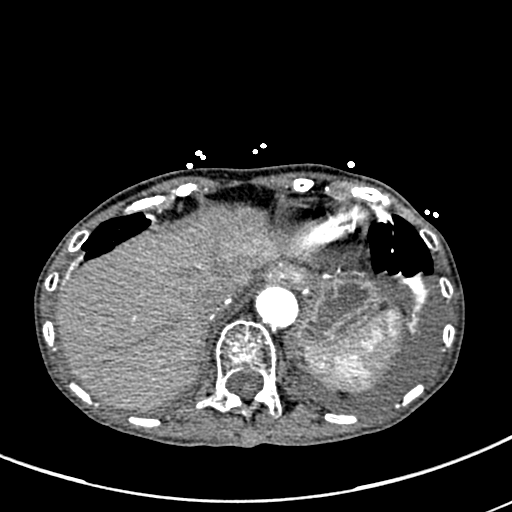
[im 42/280  lung]
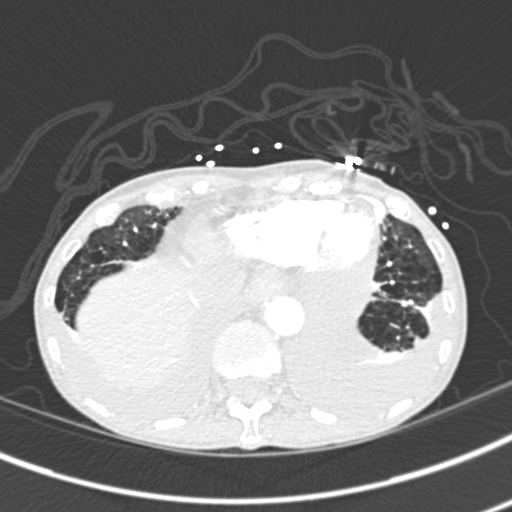
[im 56/280  mediastinal]
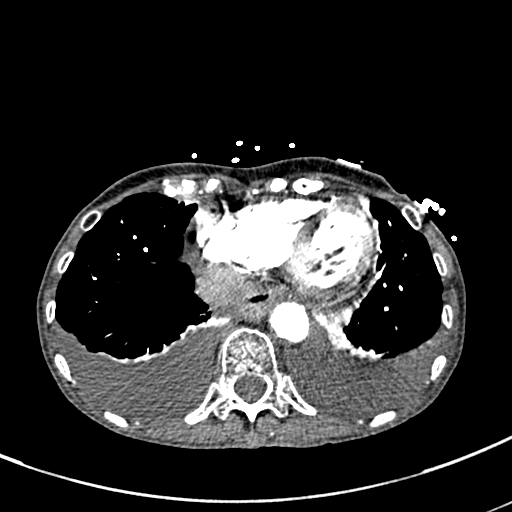
[im 70/280  lung]
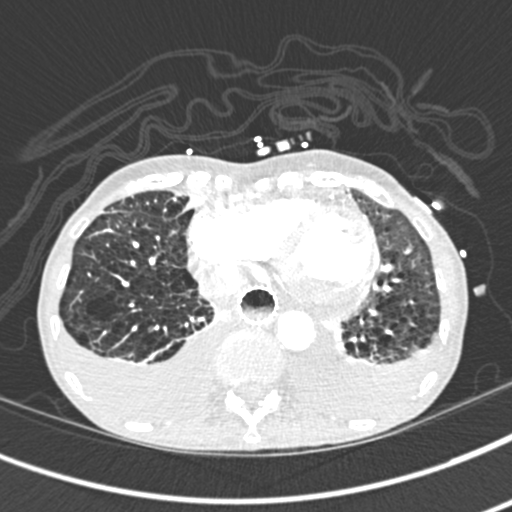
[im 84/280  mediastinal]
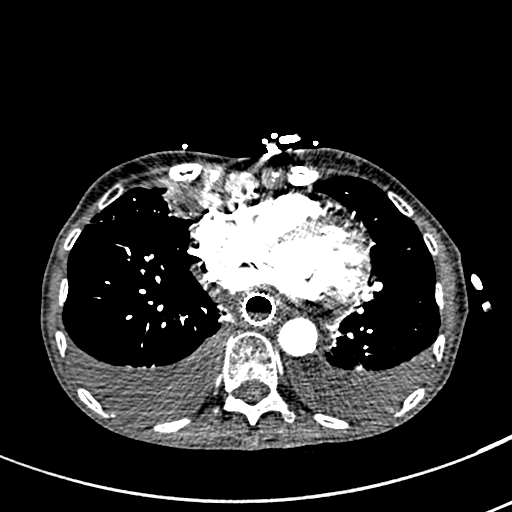
[im 98/280  lung]
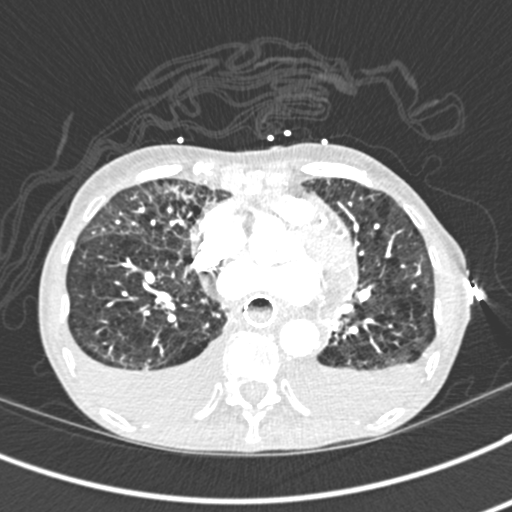
[im 112/280  mediastinal]
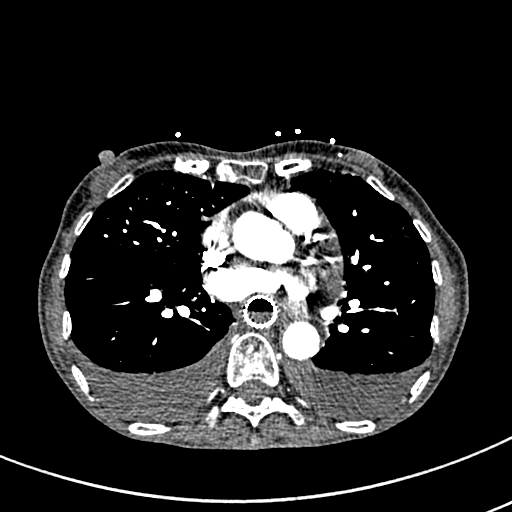
[im 126/280  lung]
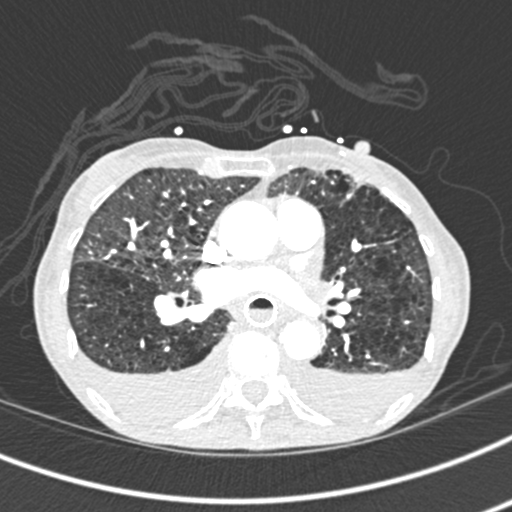
[im 154/280  mediastinal]
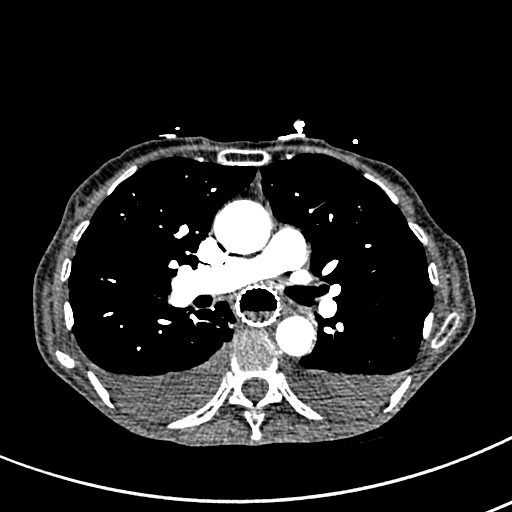
[im 168/280  lung]
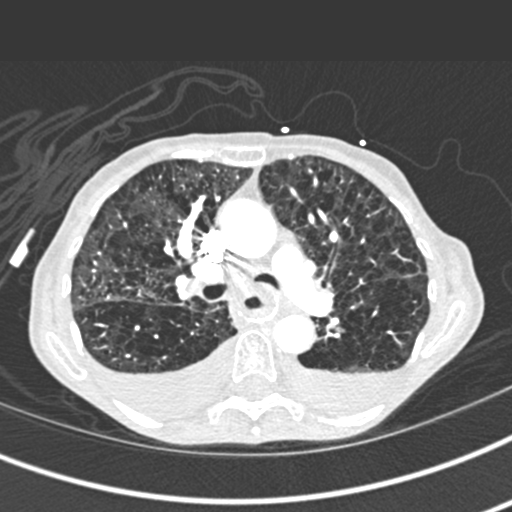
[im 182/280  mediastinal]
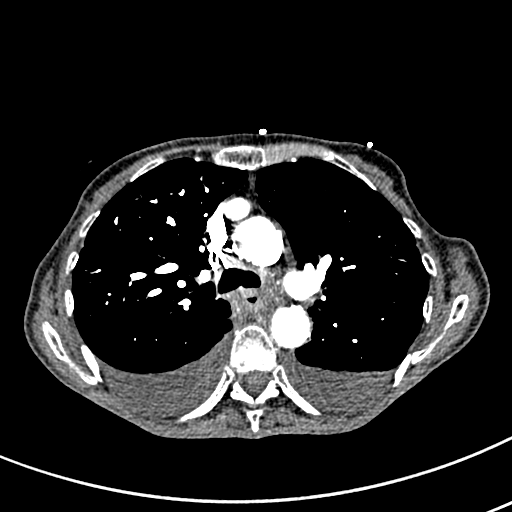
[im 196/280  lung]
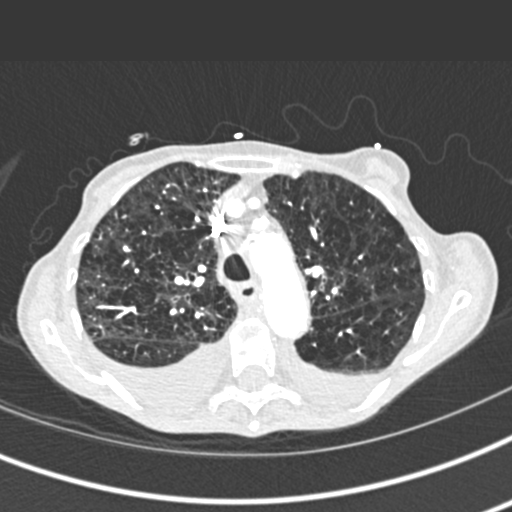
[im 210/280  mediastinal]
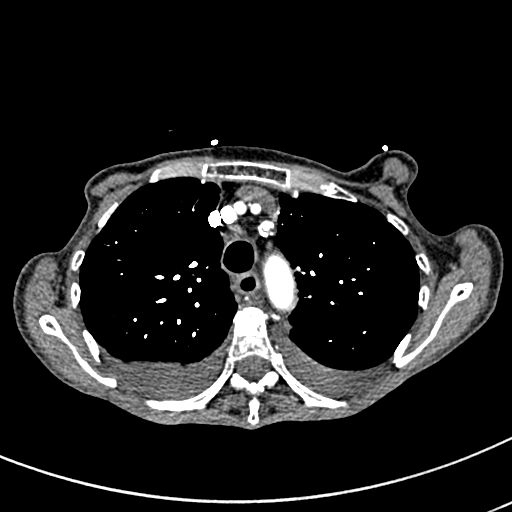
[im 224/280  lung]
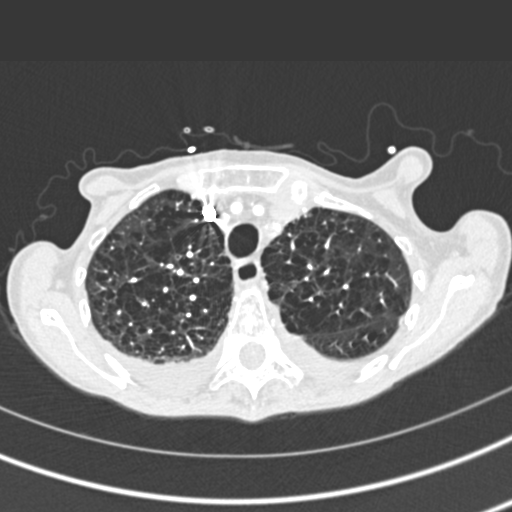
[im 238/280  mediastinal]
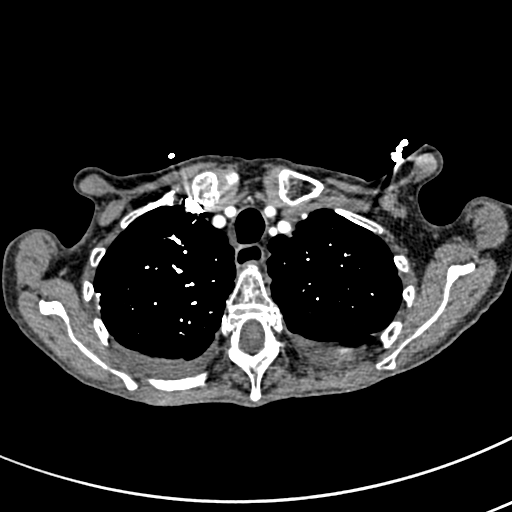
[im 252/280  lung]
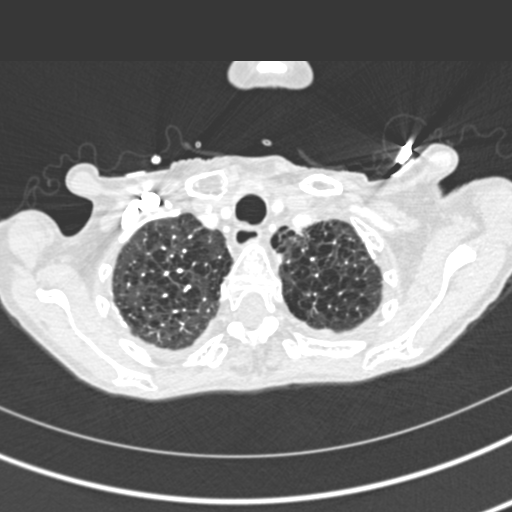
[im 266/280  mediastinal]
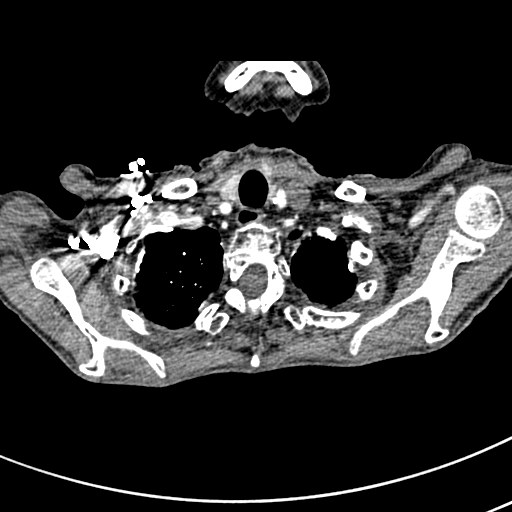

[Series 8: coronal mpr · coronal · 0.49mm/px · 1 of 94 slices shown]
[im 47/94  mediastinal]
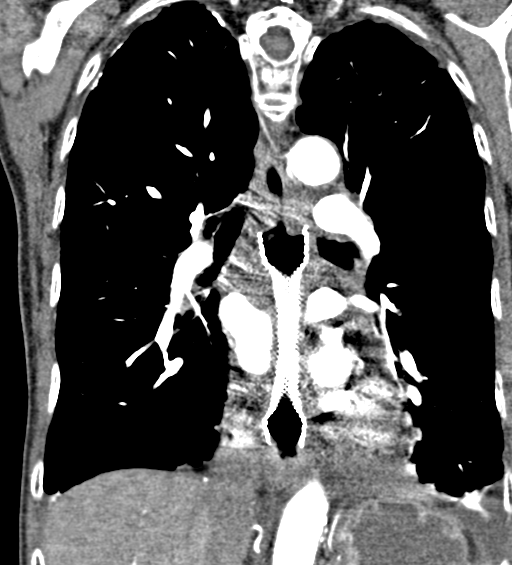

[19 of 36 positions shown; findings below may reference images not displayed]

FINDINGS: Cardiovascular: Heart is upper limits normal in size. Moderate
atherosclerotic calcifications throughout the aorta and coronary
arteries. No evidence of aortic aneurysm. No evidence of pulmonary
embolus.

Mediastinum/Nodes: Esophageal stent in place. No mediastinal, hilar,
or axillary adenopathy.

Lungs/Pleura: Moderate to large bilateral pleural effusions.
Advanced emphysema. Rounded low-density masslike area in the
anterior right middle lobe at the cardiac border measures up to
cm, new since prior CT. Compressive atelectasis in the lower lobes.

Upper Abdomen: Imaging into the upper abdomen shows no acute
findings.

Musculoskeletal: Chest wall soft tissues are unremarkable. No acute
bony abnormality.

Review of the MIP images confirms the above findings.
IMPRESSION: Esophageal stent within the mid to distal esophagus.

Severe emphysema.

Moderate to large bilateral pleural effusions.

Area of rounded low density in the medial right middle lobe
measuring 2.5 cm, new since prior study. This appears fluid density
and may reflect an area of loculated pleural fluid. This could be
followed in 3-6 months after acute symptoms resolve to assess for
change.

Aortic Atherosclerosis ([id number]).
